# Patient Record
Sex: Male | Born: 1955 | Race: White | Hispanic: No | Marital: Married | Smoking: Former smoker
Health system: Southern US, Community
[De-identification: ages and names within clinical notes are randomized; demographics above are authoritative.]

## PROBLEM LIST (undated history)

## (undated) DIAGNOSIS — I4891 Unspecified atrial fibrillation: Secondary | ICD-10-CM

## (undated) DIAGNOSIS — I10 Essential (primary) hypertension: Secondary | ICD-10-CM

## (undated) DIAGNOSIS — Z95818 Presence of other cardiac implants and grafts: Principal | ICD-10-CM

## (undated) DIAGNOSIS — G4730 Sleep apnea, unspecified: Secondary | ICD-10-CM

## (undated) DIAGNOSIS — T4145XA Adverse effect of unspecified anesthetic, initial encounter: Secondary | ICD-10-CM

## (undated) DIAGNOSIS — T8859XA Other complications of anesthesia, initial encounter: Secondary | ICD-10-CM

## (undated) DIAGNOSIS — R51 Headache: Secondary | ICD-10-CM

## (undated) DIAGNOSIS — M1990 Unspecified osteoarthritis, unspecified site: Secondary | ICD-10-CM

## (undated) DIAGNOSIS — N189 Chronic kidney disease, unspecified: Secondary | ICD-10-CM

## (undated) DIAGNOSIS — C801 Malignant (primary) neoplasm, unspecified: Secondary | ICD-10-CM

## (undated) DIAGNOSIS — I499 Cardiac arrhythmia, unspecified: Secondary | ICD-10-CM

## (undated) HISTORY — PX: BACK SURGERY: SHX140

## (undated) HISTORY — PX: TONSILLECTOMY: SUR1361

## (undated) HISTORY — PX: APPENDECTOMY: SHX54

## (undated) HISTORY — PX: LAPAROSCOPIC GASTRIC BANDING: SHX1100

## (undated) HISTORY — PX: HERNIA REPAIR: SHX51

## (undated) HISTORY — PX: CARPAL TUNNEL RELEASE: SHX101

## (undated) HISTORY — PX: MANDIBLE FRACTURE SURGERY: SHX706

## (undated) HISTORY — PX: COLON SURGERY: SHX602

## (undated) HISTORY — PX: CARDIAC CATHETERIZATION: SHX172

---

## 1998-05-03 ENCOUNTER — Ambulatory Visit (HOSPITAL_COMMUNITY): Admission: RE | Admit: 1998-05-03 | Discharge: 1998-05-04 | Payer: Self-pay | Admitting: *Deleted

## 1998-07-04 ENCOUNTER — Ambulatory Visit (HOSPITAL_COMMUNITY): Admission: RE | Admit: 1998-07-04 | Discharge: 1998-07-04 | Payer: Self-pay | Admitting: Gastroenterology

## 1998-09-22 ENCOUNTER — Ambulatory Visit (HOSPITAL_COMMUNITY): Admission: RE | Admit: 1998-09-22 | Discharge: 1998-09-22 | Payer: Self-pay

## 1998-09-22 ENCOUNTER — Encounter: Payer: Self-pay | Admitting: *Deleted

## 1998-10-18 ENCOUNTER — Encounter: Payer: Self-pay | Admitting: *Deleted

## 1998-10-18 ENCOUNTER — Ambulatory Visit (HOSPITAL_COMMUNITY): Admission: RE | Admit: 1998-10-18 | Discharge: 1998-10-18 | Payer: Self-pay | Admitting: *Deleted

## 1999-01-19 ENCOUNTER — Encounter: Payer: Self-pay | Admitting: Orthopedic Surgery

## 1999-01-19 ENCOUNTER — Ambulatory Visit (HOSPITAL_COMMUNITY): Admission: RE | Admit: 1999-01-19 | Discharge: 1999-01-19 | Payer: Self-pay | Admitting: Orthopedic Surgery

## 1999-02-18 ENCOUNTER — Ambulatory Visit (HOSPITAL_COMMUNITY): Admission: RE | Admit: 1999-02-18 | Discharge: 1999-02-18 | Payer: Self-pay | Admitting: *Deleted

## 1999-02-18 ENCOUNTER — Encounter: Payer: Self-pay | Admitting: *Deleted

## 1999-04-03 ENCOUNTER — Ambulatory Visit (HOSPITAL_COMMUNITY): Admission: RE | Admit: 1999-04-03 | Discharge: 1999-04-03 | Payer: Self-pay | Admitting: *Deleted

## 1999-04-15 ENCOUNTER — Encounter: Payer: Self-pay | Admitting: Gastroenterology

## 1999-04-15 ENCOUNTER — Ambulatory Visit (HOSPITAL_COMMUNITY): Admission: RE | Admit: 1999-04-15 | Discharge: 1999-04-15 | Payer: Self-pay | Admitting: Gastroenterology

## 1999-05-06 ENCOUNTER — Ambulatory Visit (HOSPITAL_COMMUNITY): Admission: RE | Admit: 1999-05-06 | Discharge: 1999-05-06 | Payer: Self-pay | Admitting: *Deleted

## 1999-05-25 ENCOUNTER — Ambulatory Visit (HOSPITAL_COMMUNITY): Admission: RE | Admit: 1999-05-25 | Discharge: 1999-05-25 | Payer: Self-pay | Admitting: *Deleted

## 1999-05-25 ENCOUNTER — Encounter: Payer: Self-pay | Admitting: *Deleted

## 1999-05-30 ENCOUNTER — Ambulatory Visit (HOSPITAL_COMMUNITY): Admission: RE | Admit: 1999-05-30 | Discharge: 1999-05-30 | Payer: Self-pay | Admitting: *Deleted

## 1999-05-30 ENCOUNTER — Encounter: Payer: Self-pay | Admitting: *Deleted

## 2000-06-22 ENCOUNTER — Ambulatory Visit (HOSPITAL_COMMUNITY): Admission: RE | Admit: 2000-06-22 | Discharge: 2000-06-22 | Payer: Self-pay | Admitting: Orthopedic Surgery

## 2000-06-22 ENCOUNTER — Encounter: Payer: Self-pay | Admitting: Orthopedic Surgery

## 2000-09-07 ENCOUNTER — Ambulatory Visit (HOSPITAL_COMMUNITY): Admission: RE | Admit: 2000-09-07 | Discharge: 2000-09-07 | Payer: Self-pay | Admitting: Gastroenterology

## 2000-09-07 ENCOUNTER — Encounter: Payer: Self-pay | Admitting: Gastroenterology

## 2000-09-07 ENCOUNTER — Encounter (INDEPENDENT_AMBULATORY_CARE_PROVIDER_SITE_OTHER): Payer: Self-pay | Admitting: Specialist

## 2002-01-20 ENCOUNTER — Encounter: Payer: Self-pay | Admitting: Gastroenterology

## 2002-01-20 ENCOUNTER — Ambulatory Visit (HOSPITAL_COMMUNITY): Admission: RE | Admit: 2002-01-20 | Discharge: 2002-01-20 | Payer: Self-pay | Admitting: Gastroenterology

## 2003-10-12 ENCOUNTER — Inpatient Hospital Stay (HOSPITAL_COMMUNITY): Admission: EM | Admit: 2003-10-12 | Discharge: 2003-10-19 | Payer: Self-pay | Admitting: *Deleted

## 2003-10-12 ENCOUNTER — Encounter (INDEPENDENT_AMBULATORY_CARE_PROVIDER_SITE_OTHER): Payer: Self-pay | Admitting: Specialist

## 2003-11-11 ENCOUNTER — Inpatient Hospital Stay (HOSPITAL_COMMUNITY): Admission: EM | Admit: 2003-11-11 | Discharge: 2003-11-15 | Payer: Self-pay | Admitting: Emergency Medicine

## 2003-12-05 ENCOUNTER — Inpatient Hospital Stay (HOSPITAL_COMMUNITY): Admission: AD | Admit: 2003-12-05 | Discharge: 2003-12-11 | Payer: Self-pay | Admitting: General Surgery

## 2003-12-05 ENCOUNTER — Encounter: Admission: RE | Admit: 2003-12-05 | Discharge: 2003-12-05 | Payer: Self-pay | Admitting: Surgery

## 2003-12-15 ENCOUNTER — Ambulatory Visit (HOSPITAL_COMMUNITY): Admission: RE | Admit: 2003-12-15 | Discharge: 2003-12-15 | Payer: Self-pay | Admitting: Surgery

## 2004-01-27 ENCOUNTER — Inpatient Hospital Stay (HOSPITAL_COMMUNITY): Admission: EM | Admit: 2004-01-27 | Discharge: 2004-01-31 | Payer: Self-pay | Admitting: Emergency Medicine

## 2004-02-09 ENCOUNTER — Inpatient Hospital Stay (HOSPITAL_COMMUNITY): Admission: RE | Admit: 2004-02-09 | Discharge: 2004-02-16 | Payer: Self-pay | Admitting: Surgery

## 2004-02-09 ENCOUNTER — Encounter (INDEPENDENT_AMBULATORY_CARE_PROVIDER_SITE_OTHER): Payer: Self-pay | Admitting: *Deleted

## 2004-04-09 ENCOUNTER — Ambulatory Visit (HOSPITAL_BASED_OUTPATIENT_CLINIC_OR_DEPARTMENT_OTHER): Admission: RE | Admit: 2004-04-09 | Discharge: 2004-04-09 | Payer: Self-pay | Admitting: Family Medicine

## 2005-03-24 ENCOUNTER — Encounter: Admission: RE | Admit: 2005-03-24 | Discharge: 2005-03-24 | Payer: Self-pay | Admitting: Neurosurgery

## 2005-04-25 ENCOUNTER — Inpatient Hospital Stay (HOSPITAL_COMMUNITY): Admission: RE | Admit: 2005-04-25 | Discharge: 2005-04-29 | Payer: Self-pay | Admitting: Neurosurgery

## 2005-06-05 ENCOUNTER — Encounter: Admission: RE | Admit: 2005-06-05 | Discharge: 2005-06-05 | Payer: Self-pay | Admitting: Neurosurgery

## 2005-08-06 ENCOUNTER — Encounter: Admission: RE | Admit: 2005-08-06 | Discharge: 2005-08-06 | Payer: Self-pay | Admitting: Neurosurgery

## 2007-10-26 ENCOUNTER — Encounter: Admission: RE | Admit: 2007-10-26 | Discharge: 2007-10-26 | Payer: Self-pay | Admitting: Neurosurgery

## 2010-12-04 ENCOUNTER — Encounter
Admission: RE | Admit: 2010-12-04 | Discharge: 2010-12-04 | Payer: Self-pay | Source: Home / Self Care | Attending: Neurosurgery | Admitting: Neurosurgery

## 2011-04-18 NOTE — Op Note (Signed)
[facility]  Patient:    KAINAN, PATTY                   MRN: [id number] Proc. Date: 09/07/00 Attending:  Griffith Citron, M.D. CC:         Dyanne Carrel, M.D.   Operative Report  PROCEDURE:   Panendoscopy, biopsy, Savary dilation.  ENDOSCOPIST:  Griffith Citron, M.D.  INDICATIONS:  A 55 year old white male with recurrent symptoms of dysphagia. Last evaluated July 1999 for similar complaints.  Endoscopy revealing a distal esophageal stricture dilated to 20 mm Savary.  Recurrent symptoms over the past two to three months despite Prevacid 30 mg p.o. b.i.d.  After being seen in the office two weeks ago, switched to Nexium 40 mg daily. The patient notes marked improvement, decreased pyrosis, no nausea, vomiting, or regurgitation.  Dysphagia minimally improved.  DESCRIPTION OF PROCEDURE:  After reviewing the nature of the procedure with the patient including potential risks and complications including hemorrhage and perforation, informed consent was signed.  The patient was brought to the fluoroscopy suite where he was premedicated, receiving IV sedation totalling Versed  5 mg, fentanyl 87.5 mcg administered in divided doses prior to and during the course of the procedure.  Using an Olympus video endoscope, the proximal esophagus was intubated under direct vision.  The oropharynx was normal.  No lesion of the epiglottis, vocal cords, or pyriform sinus.  The proximal, mid, and distal segments of the esophagus were normal.  At the level of the mucosal Z-line at approximately 36 cm, the patient had a moderate hiatal hernia extending to 40 cm, not inflamed.  There appeared to be a nonobstructing Schatzki ring rather than a true stricture at this level.  The mucosa was not inflamed or nodular.  The hiatal hernia itself was not inflamed.  The gastric fundus and body were normal.  Antrum remarkable for erosive antritis.  Biopsies obtained  both for Helicobacter and routine H/E.  The pylorus was symmetric and easily traversed.  Duodenal bulb and second portion were normal.  Retroflexed view of the angularis, lesser curvature, and fundus were negative.  A guidewire was laid along the greater curve.  Savary guided dilators were then sequentially passed, 17, 18, 19, and finally 20 mm diameter.  No heme staining, no chest pain.  The patient tolerated the procedure without difficulty.  Upon withdrawing the 20 mm dilator, the guidewire was also removed.  The stomach was decompressed, scope withdrawn.  Patient return to recovery in stable condition.  ASSESSMENT: 1. Schatzki ring, probable source of dysphagia. 2. Hiatal hernia, moderate, not inflamed. 3. Erosive gastritis, moderate.  Helicobacter and routine pathology obtained. 4. Esophageal dilation successfully completed to 20 mm diameter.  RECOMMENDATIONS: 1. Continue Nexium 40 mg daily for 1 month, the consider ongoing maintenance    therapy. 2. Treat if Helicobacter positive. 3. Repeat esophageal dilation p.r.n. DD:  09/07/00 TD:  09/08/00 Job: [id number]

## 2011-04-18 NOTE — H&P (Signed)
NAME:  RENNY, REMER                         ACCOUNT NO.:  [id number]   MEDICAL RECORD NO.:  [id number]                   PATIENT TYPE:  INP   LOCATION:  5708                                 FACILITY:  [facility]   PHYSICIAN:  Sharlet Salina T. Hoxworth, M.D.          DATE OF BIRTH:  [birth year]   DATE OF ADMISSION:  01/27/2004  DATE OF DISCHARGE:                                HISTORY & PHYSICAL   CHIEF COMPLAINT:  Fever and abdominal pain.   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  Since that time, he has developed recurrent pelvic and  parastomal abscesses requiring percutaneous drainage on two occasions in  December of 2004 and January of 2005.  He had been doing well recently until  about five days ago when he developed some fever up to 101 degrees.  He was  seen by his primary care M.D. and started on Biaxin for possible sinusitis.  The patient, however, has continued to have fever during this week up as  high as 101.9 yesterday.  He and his wife noted some redness to the striae  on his lower abdomen yesterday.  He has had some low back pain which is a  chronic problem for him, but this was worse this week and the pain has  migrated around to the left mid abdomen and left lower quadrant of his  abdomen over the past 24 hours.  He had some constipation and took some  laxatives earlier this week which relieved some lower abdominal pressure and  his colostomy has been functioning okay.  He has a normal small amount of  mucous drainage from his rectal stump.  He denies any shortness of breath,  cough, sinus congestion.   PAST SURGICAL HISTORY:  1. As above.  2. Multiple back surgeries.  3. Carpal tunnel release.  4. Jaw surgery.   PAST MEDICAL HISTORY:  1. Adult onset diabetes mellitus diet controlled.  2. Hypertension.  3. 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  4. Depression.   MEDICATIONS:  1. Atenolol 50 mg a day.  2. Diovan 80 mg a day.  3. Prevacid 30 mg a day.  4. Biaxin for the last four days.  5. Zoloft 25 mg a day.  6. Percocet p.r.n.   ALLERGIES:  PENICILLIN and OXYCODONE.   SOCIAL HISTORY:  He is married, accompanied by his wife.  Does not smoke  cigarettes or drink alcohol.   FAMILY HISTORY:  Noncontributory.   REVIEW OF SYSTEMS:  GENERAL:  Positive for fever as above.  HEENT:  Denies  sinus pain, congestion. LUNGS:  Denies shortness of breath, cough, or  wheezing. HEART:  Denies chest pain, palpitations, or swelling. ABDOMEN:  GI  as above.  GENITOURINARY:  Denies urinary burning or frequency. EXTREMITIES:  Chronic back pain.   PHYSICAL EXAMINATION:  VITAL SIGNS:  Temperature in the emergency room is  98.3, pulse 76, respirations 22, blood pressure 140/94.  GENERAL:  Mildly obese white male in no acute distress.  SKIN:  Warm and dry without rash.  HEENT:  No palpable masses or thyromegaly.  Sclerae nonicteric.  Nares and  oropharynx clear.  LUNGS:  Clear to auscultation without increased work of breathing.  HEART:  Regular rate and rhythm without murmurs.  No JVD or edema.  ABDOMEN:  Mildly obese.  Colostomy in the left upper quadrant.  There is  some mild erythema to some of the striae in his lower abdomen.  There is  moderate tenderness and some fullness in the left mid abdomen and parastomal  area on the left where he has had previous abscess drains placed.  No  organomegaly.  No hernias.  There is a midline wound with open area of  granulation tissue measuring about 3 x 2 cm that appears clean.  EXTREMITIES:  No joint swelling or deformity.  NEUROLOGY:  Alert, oriented, normal.   LABORATORY DATA:  White count elevated at 17.3 thousand, hemoglobin 12.5.  Urinalysis negative.  LFT's normal.  Albumin 2.7.  Electrolytes abnormal for  a sodium of 131, potassium 2.8, glucose 131.   ASSESSMENT:  21. A 55 year old white male with history of  Hartman colectomy for     diverticulitis last year with history of recurrent pelvic and parastomal     abscesses requiring percutaneous drainage.  He now has fever, elevated     white count, abdominal pain and fullness, all suggestive of recurrent     abscess.  He will be admitted and placed on broad spectrum IV antibiotics     and will obtain a CT scan of the abdomen and pelvis.  2. Hyponatremia and hypokalemia to be corrected with IV fluids and     replacement.                                                Lorne Skeens. Hoxworth, M.D.    Tory Emerald  D:  01/27/2004  T:  01/27/2004  Job:  [id number]

## 2011-04-18 NOTE — H&P (Signed)
NAME:  Casey [name], Casey [name]                         ACCOUNT NO.:  [id number]   MEDICAL RECORD NO.:  [id number]                   PATIENT TYPE:  INP   LOCATION:  5031                                 FACILITY:  [facility]   PHYSICIAN:  Ollen Gross. Vernell Morgans, M.D.              DATE OF BIRTH:  Jul 17, 1956   DATE OF ADMISSION:  11/10/2003  DATE OF DISCHARGE:                                HISTORY & PHYSICAL   Casey [name] is a 55 year old white male who is approximately three weeks now  status post sigmoid colectomy and colostomy for a perforated sigmoid  diverticulitis done by Dr. Magnus Ivan.  He initially did well after surgery  but over the last couple of days has been having fevers and lower abdominal  pain.  He has had one episode of dry heaves but has otherwise not been  nauseated.  He has run some fevers at home.  The pain has really worsened  over the last couple of days since it started.  His ostomy has been  functional, and his open wound has been stable.  He otherwise denies any  chest pains, shortness of breath, diarrhea, or dysuria, and his other review  of systems is unremarkable.   PAST MEDICAL HISTORY:  1. Diverticulitis.  2. Back problems.  3. Diabetes.  4. Hypertension.   PAST SURGICAL HISTORY:  1. Multiple back surgeries.  2. Sigmoid colectomy and colostomy.  3. Carpal tunnel.  4. Jaw surgery.  5. Bone harvest from the left hip.   MEDICATIONS:  Atenolol, Diovan, Prevacid.   ALLERGIES:  1. PENICILLIN.  2. OXYCODONE.   SOCIAL HISTORY:  Denies the use of alcohol or tobacco products.   FAMILY HISTORY:  Noncontributory.   PHYSICAL EXAMINATION:  VITAL SIGNS:  His temp is 101.5, blood pressure  100/57, pulse 88.  GENERAL:  He is a well-developed, well-nourished white male in no acute  distress.  SKIN:  Warm and dry with no jaundice.  EYES:  Extraocular muscles are intact.  Pupils are equal, round, and  reactive to light.  Sclerae are anicteric.  LUNGS:  Clear bilaterally with  no use of accessory respiratory muscles.  HEART:  Regular rate and rhythm with an impulse in the left chest.  ABDOMEN:  Soft with some lower abdominal tenderness, but no guarding or  peritoneal signs.  His ostomy is pink and productive,  slightly retracted  but functional.  He has an open midline wound that is stable.  No palpable  mass or hepatosplenomegaly.  EXTREMITIES:  No clubbing, cyanosis, or edema.  PSYCHOLOGICAL:  He is alert and oriented x3 with no evidence of anxiety or  depression.   His white count is 20,000.  His urine is clean.   ASSESSMENT/PLAN:  A 55 year old gentleman status post sigmoid colectomy and  colostomy for perforated sigmoid diverticulitis in the recent past.  His  picture is certainly worrisome for a pelvic abscess related to  his previous  perforation.  We will admit him to the hospital for observation, pain  control, and broad-spectrum antibiotic therapy.  We will plan to get a CT  scan on him tonight that will look for any evidence of abscess that could  possibly be percutaneously drained by radiology.                                                Ollen Gross. Vernell Morgans, M.D.    PST/MEDQ  D:  11/11/2003  T:  11/11/2003  Job:  [id number]

## 2011-04-18 NOTE — Discharge Summary (Signed)
NAME:  Casey [name], Casey [name]                         ACCOUNT NO.:  [id number]   MEDICAL RECORD NO.:  [id number]                   PATIENT TYPE:  INP   LOCATION:  5708                                 FACILITY:  [facility]   PHYSICIAN:  Abigail Miyamoto, M.D.              DATE OF BIRTH:  1956/11/23   DATE OF ADMISSION:  01/27/2004  DATE OF DISCHARGE:  01/31/2004                                 DISCHARGE SUMMARY   DISCHARGE DIAGNOSES:  1. Pelvic abscess, status post colostomy for perforated diverticulitis.  2. Type 2 diabetes.  3. Hypokalemia.   SUMMARY OF HISTORY:  [name] is a 55 year old gentleman who is status  post Hartmann's colectomy and colostomy in November 2004 for perforated  diverticulitis.  He had had a pelvic and peristomal abscess which was  drained in late December/early January.  He presented after the drain has  been removed for several weeks with abdominal pain and fever.  Therefore,  decision was made to admit him to the hospital.   HOSPITAL COURSE:  The patient was admitted and IV antibiotics were  performed.  He underwent a CAT scan of the abdomen and pelvis, which showed  him to have recurrent left lower quadrant abscess.  At this point, he  underwent drainage again by the radiologist.  A PICC line was also placed  for home IV antibiotics and he was started on vancomycin.  He underwent  percutaneous drainage, which was successful, and cultures at this time were  obtained as well.  White blood count returned 8.6 and he defervesced and  generally felt well.  By January 31, 2004, he did well and his cultures were  growing microaerophilic strep.  His creatinine at this time was normal and a  decision was made to discharge him home with the drains in place, on IV  antibiotics.   DISCHARGE DIET:  Regular.   DISCHARGE ACTIVITY:  He is still to do no heavy lifting.   SPECIAL DISCHARGE INSTRUCTIONS:  Home health is arranged for his wet-to-dry  dressing changes of the midline  wound as well as his IV antibiotics.   DISCHARGE MEDICATIONS:  1. He is going to be on vancomycin 1750 IV q.12 h.  2. He will also be on clindamycin 150 mg p.o. 4 times daily.  3. He will resume his home medications.   FOLLOWUP:  I will see him next week in my office.                                                Abigail Miyamoto, M.D.    DB/MEDQ  D:  03/04/2004  T:  03/05/2004  Job:  [id number]

## 2011-04-18 NOTE — Op Note (Signed)
NAMEALHASSAN, EVERINGHAM               ACCOUNT NO.:  [id number]   MEDICAL RECORD NO.:  [id number]          PATIENT TYPE:  INP   LOCATION:  2899                         FACILITY:  [facility]   PHYSICIAN:  Kathaleen Maser. Pool, M.D.    DATE OF BIRTH:  18-Sep-1956   DATE OF PROCEDURE:  04/25/2005  DATE OF DISCHARGE:                                 OPERATIVE REPORT   PREOPERATIVE DIAGNOSES:  1.  L3-4 degenerative disk disease with stenosis.  2.  L4-5 previous interbody fusion with cage instrumentation with documented      pseudoarthrosis and pain.  3.  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   POSTOPERATIVE DIAGNOSES:  1.  L3-4 degenerative disk disease with stenosis.  2.  L4-5 previous interbody fusion with cage instrumentation with documented      pseudoarthrosis and pain.  3.  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   PROCEDURES:  1.  L3-4 decompressive lumbar laminectomy with foraminotomies.      Decompression exceeding what would be required for normal interbody      fusion.  2.  L3-4 posterior lumbar interbody fusion utilizing Tangent wedges and Tela      mon interbody cages with local autografting.  3.  Interbody bone morphogenic protein placement.  4.  L4-5 reexploration of laminectomy with bilateral redo L4 and L5      foraminotomies.  5.  L4-5 reexploration of fusion.  6.  L5-S1 Gill procedure with L5 and S1 foraminotomies.  7.  L5-S1 posterior lumbar interbody fusion utilizing Tangent wedges and      local autografting.  8.  L3 through S1 posterolateral arthrodesis utilizing intertransverse bone      grafting with local autograft and bone morphogenic protein and segmental      pedicle screw instrumentation.   SURGEON:  Kathaleen Maser. Pool, M.D.   ASSISTANT:  Tia Alert, M.D.   ANESTHESIA:  General endotracheal.   INDICATIONS:  Mr. Adames is a 55 year old male who is status post previous  L4-5 interbody fusion with  cages by another physician.  The patient presents  to me with intractable, severe back pain which is disabling and has been  described by the patient as being unlivable.  Workup at this time  demonstrates evidence of subsidence of the cages at L4-5 with loosening of  the cages readily apparent on CT scanning.  The patient also has evidence of  significant degenerative disk disease with stenosis at the L3-4 level  causing compression of the thecal sac and nerve roots.  The patient also had  evidence of an L5-S1 grade 1 lytic spondylolisthesis.  I have discussed the  options available for management, including the patient possibly undergoing  L3 through S1 decompression fusion and instrumentation.  The patient is  aware of the risks and benefits and wishes to proceed.   OPERATIVE NOTE:  The patient was taken to the operating room and placed on  the operating table in supine position.  After an adequate level of  anesthesia was achieved, the patient positioned prone onto a [employer]  frame  and appropriately padded.  The patient's lumbar region was prepped and  draped sterilely.  A 10 blade was used to make a linear skin incision  extending from L2 down to the midsacrum.  This was carried down sharply in  the midline.  A subperiosteal dissection was then performed exposing the  laminae and facet joints of L2, L3, L4, L5 and S1.  The transverse processes  of L3, L4, L5 and the sacral ala were then dissected free bilaterally.  A  deep self-retaining retractor was placed, intraoperative fluoroscopy was  used, and the levels were confirmed.  Starting first at L3-4, the spinous  process and the lamina of L3 was completely removed using Leksell rongeurs,  Kerrison rongeurs, a high-speed drill.  Complete inferior facetectomies at  L3 were then performed bilaterally.  Superior facetectomies at L4 were  performed bilaterally.  Wide foraminotomies were then performed along the  course of the exiting L3 and  L4 nerve roots for full decompression of these  nerve roots, once again exceeding what would be required for interbody  fusion alone.  Decompression then proceeded along the L4 nerve root  distally.  Epidural venous plexus coagulated and cut.  The dense overlying  scar at the L5 level was dissected free and resected.  The underlying thecal  sac was identified.  Further decompression was then performed along the  course of the existing L4 nerve roots.  The L5 nerve roots were then  identified proximally and decompressed.  The free-floating lamina at L5 was  then identified.  This was then removed using Leksell rongeurs, Kerrison  rongeurs and the high-speed drill.  The inferior facets of L5 were also  removed.  Decompression of the L5 nerve roots was then performed by  undercutting the rudimentary vestigial L5 inferior facet in a method  described as the Gill procedure.  Wide foraminotomies were then performed  along the course of the exiting L5 and S1 nerve roots.  Epidural venous  plexus was coagulated and cut at the L5-S1 level.  Returning to L3-4, thecal  sac and nerve roots were protected and retracted toward the midline.  The  disk space on the left side was then incised with a 15 blade in rectangular  fashion.  A wide disk space clean-out was then achieved using pituitary  rongeurs, upward-angled pituitary rongeurs and Epstein curettes.  All  elements of the disk were completely resected from this side.  The  diskectomy was then repeated on the contralateral side and then the  diskectomy was repeated bilaterally at L5-S1.  Returning to L3-4, thecal sac  and nerve roots protected on the left side.  A distractor was placed in the  left side and the disk space was sequentially dilated up to 10 mm.  The  thecal sac and nerve roots were then protected on the right side.  The disk  space was then reamed with a 10 mm Tangent box cutter and then cut with a 10 mm Tangent chisel.  Soft tissue  was removed from the interspace.  A 10 x 26  mm Telemon wedge packed with interbody bone was then impacted into place.  This was recessed approximately 2 mm from the posterior cortical margin.  The distractor was removed from the patient's left side.  The thecal sac and  nerve roots protected on the left side.  Once again the disk space was  reamed and then cut with 10 mm Tangent instruments.  All loose disk material  was removed from the interspace.  The disk space was further curettaged.  Morcellized autograft in a collagen-soaked sponge with BMP was then packed  into the interspace.  A 10 x 26 mm Tangent wedge was then impacted in place  and recessed approximately 2 mm from the posterior cortical margin.  The  procedure was then repeated bilaterally at L5-S1 again without complication.  The fusion at L4-5 was explored and found to be not solid.  Pedicles at L3,  L4, L5 and S1 were then identified using surface landmarks.  The very  superficial bone overlying the pedicle was then removed using the high-speed  drill.  Each pedicle was then probed using the pedicle awl.  Each pedicle  awl track was then probed with a blunt probe and found to be solidly within  bone.  Each awl track was then tapped with a 5.25 mm screw tap.  Spiral 90D  6.75 x 45 mm screws were placed bilaterally at L3 and L4.  Screws 5.75 x 40  mm placed bilaterally at L5 secondary to small pedicles.  Screws 6.75 x 35  mm were placed bilaterally at S1.  The transverse processes at L3, L4, L5  and S1 were then decorticated using the high-speed drill.  Morcellized  autograft mixed with collagen-soaked BMP sponges was then packed  posterolateral for later fusion.  Titanium rods were then contoured and  placed over the screw heads from L3 to L5.  The locking caps were then  engaged over the screw heads.  The locking caps were then given a final  tightening with the construct under compression.  Transverse connectors were   placed.  Gelfoam was placed for hemostasis.  A medium Hemovac drain was left  in the epidural space.  Final images revealed good position of bone grafts  and  hardware, proper operative level, with normal alignment of the spine.  The  wound was then closed in layers with Vicryl sutures.  Steri-Strips and  sterile dressing were applied.  There were no apparent complications.  The  patient tolerated the procedure well, and he returns to the recovery room  postop.      HAP/MEDQ  D:  04/25/2005  T:  04/25/2005  Job:  [id number]

## 2011-04-18 NOTE — Discharge Summary (Signed)
NAMEABHI, MOCCIA               ACCOUNT NO.:  [id number]   MEDICAL RECORD NO.:  [id number]          PATIENT TYPE:  INP   LOCATION:  3010                         FACILITY:  [facility]   PHYSICIAN:  Kathaleen Maser. Pool, M.D.    DATE OF BIRTH:  July 29, 1956   DATE OF ADMISSION:  04/25/2005  DATE OF DISCHARGE:  04/29/2005                                 DISCHARGE SUMMARY   FINAL DIAGNOSIS:  L4-5 pseudoarthrosis, L3-4, L5-S1 degenerative disk  disease with an L5-S1 lytic spondylolisthesis.   OPERATIONS/TREATMENT:  L3 through S1 decompression and fusion  instrumentation.   HISTORY OF PRESENT ILLNESS:  Mr. Coate is a 55 year old who is status post  previous L4-5 fusion done by another physician. The patient has evidence of  pseudoarthrosis. He also has systematic degenerative disk disease of the L3-  4 level and a lytic grade 1 spondylolisthesis at L5-S1. The patient presents  now for L3-S1 decompression and fusion instrumentation.   OPERATIVE NOTE:  The patient underwent an uncomplicated L3-S1 decompression  and fusion instrumentation was performed. Postoperative, the patient did  reasonably well. Lower extremity pain was much improved. The patient  obviously had a significant amount of back pain. This was managed medically.  He was gradually mobilized. At the time of discharge, the patient is  ambulating without difficulty. He is still much improved with regard to his  lower extremity pain. Bowel and bladder function were normal. Wound is  healing well. He will follow up in my office in 1 week.   CONDITION ON DISCHARGE:  Improved.           ______________________________  Kathaleen Maser Pool, M.D.     HAP/MEDQ  D:  07/22/2005  T:  07/22/2005  Job:  [id number]

## 2011-04-18 NOTE — Discharge Summary (Signed)
NAME:  Casey [name], Casey [name]                         ACCOUNT NO.:  [id number]   MEDICAL RECORD NO.:  [id number]                   PATIENT TYPE:  INP   LOCATION:  5708                                 FACILITY:  [facility]   PHYSICIAN:  Abigail Miyamoto, M.D.              DATE OF BIRTH:  29-Dec-1955   DATE OF ADMISSION:  02/09/2004  DATE OF DISCHARGE:  02/16/2004                                 DISCHARGE SUMMARY   DISCHARGE DIAGNOSES:  1. Status post colostomy take down and colon reanastomosis.  2. Hypertension.  3. Type 2 diabetes.  4. Obesity.   SUMMARY OF HISTORY:  Mr. Casey [name] is a pleasant 55 year old gentleman  who had had a colostomy performed emergently for perforated diverticulitis.  He now presents for a colostomy take down and colon reanastomosis as well as  incidental appendectomy.   HOSPITAL COURSE:  The patient was admitted on February 09, 2004 and taken to  the operating room where he underwent a colostomy take down and colon  reanastomosis as well as an appendectomy. He had one 19-French Blake drain  left in place in the pelvis where he had had previous abscesses. He  tolerated the procedure well and was taken in stable condition to the  regular surgical floor. Over the next several days, he had routine  postoperative care with bowel rest and IV rehydration. On postoperative day  #3, his IV fluids were decreased. The Foley was removed, and he was started  on a clear liquid diet. At this point, he did have some mild erythema of the  incision, and the antibiotics were changed to Ancef. Postoperative day #4,  his laboratory data was checked. His white blood count was found to be  normal at 8.1, hemoglobin was 11.0, creatinine 0.7, potassium 3.4. He was  continued on a liquid diet at this time. By postoperative day #6, he was  having multiple bowel movements. His abdomen was soft. His erythema around  his incision had resolved. His drain remained serosanguineous. At this  point,  his diet was advanced. By postoperative day #7, he was tolerating a  regular diet. He was having multiple bowel movements. His drain was still  draining serosanguineous fluid, but otherwise, he was afebrile, and decision  was made to discharge the patient with the drain in place for removal later  in the office.   DISCHARGE DIET:  Regular.   DISCHARGE ACTIVITIES:  He should do not heavy lifting greater than 20 pounds  for six weeks.   WOUND CARE:  He may shower. He will record his drain output.   MEDICATIONS:  He will resume his home medications. He will take Percocet and  Advil for pain. He will take Keflex 500 mg t.i.d. for three days.   DISCHARGE FOLLOWUP:  He will follow up in my office this following Monday  for drain and staple removal.  Abigail Miyamoto, M.D.   DB/MEDQ  D:  04/02/2004  T:  04/03/2004  Job:  [id number]

## 2011-04-18 NOTE — Discharge Summary (Signed)
NAME:  Casey [name], Casey [name]                         ACCOUNT NO.:  [id number]   MEDICAL RECORD NO.:  [id number]                   PATIENT TYPE:  INP   LOCATION:  5714                                 FACILITY:  [facility]   PHYSICIAN:  Abigail Miyamoto, M.D.              DATE OF BIRTH:  1956-03-15   DATE OF ADMISSION:  12/05/2003  DATE OF DISCHARGE:  12/11/2003                                 DISCHARGE SUMMARY   SUMMARY OF HISTORY:  Casey [name] is a 55 year old gentleman who has  undergone emergent exploration with partial colectomy and colostomy for  perforated diverticulitis.  He presented postoperatively in December with an  abscess and had his percutaneous drain.  However, he presented back to the  office with increasing fevers and chills.  At this point, a CT scan was  performed which showed two recurrent abscesses.   HOSPITAL COURSE:  The patient was admitted and placed on IV antibiotics.  Radiology undertook percutaneous drainage of these abscesses.  Two more  drains were placed.  After these were placed, he quickly defervesced. He was  placed on Cipro and Flagyl.  Over the next few days, he continued to feel  stronger.  His blood glucose at this time remained 103 to 116 range.  He  appeared well controlled.  His white blood count continued to decrease, and  he was down to 12,000 on January 8.  His drains still drained purulent  fluids over the next several days, however.  By January 9, his cultures grew  out ________ strep and he was placed on vancomycin for 24 hours.  He  continued to do well.  The drainage continued to decrease.  By January 10,  he was afebrile.  His ostomy was working well.  His abdomen was soft.  There  was minimal induration of his wounds and white blood count was normal.  At  this point, the decision was made to discharge the patient home with Home  Health and drain care.   DISCHARGE DIAGNOSIS:  Recurrent pelvic abscess, status post perforated  diverticulitis.   DISCHARGE DIET:  Regular.   DISCHARGE ACTIVITY:  As tolerated.  Home Health is arranged for wound care,  ostomy care and drain care.   DISCHARGE MEDICATIONS:  1. Home medications.  2. Cipro.  3. Flagyl for 14 days.   FOLLOWUP:  He will follow up in my office next week post discharge.                                                Abigail Miyamoto, M.D.    DB/MEDQ  D:  01/01/2004  T:  01/01/2004  Job:  [id number]

## 2011-04-18 NOTE — Discharge Summary (Signed)
NAME:  SEDALE, JENIFER                         ACCOUNT NO.:  [id number]   MEDICAL RECORD NO.:  [id number]                   PATIENT TYPE:  INP   LOCATION:  5031                                 FACILITY:  [facility]   PHYSICIAN:  Abigail Miyamoto, M.D.              DATE OF BIRTH:  [birth year]   DATE OF ADMISSION:  11/11/2003  DATE OF DISCHARGE:  11/15/2003                                 DISCHARGE SUMMARY   DISCHARGE DIAGNOSES:  1. Pelvic abscess, status post perforated diverticulitis.  2. Type 2 diabetes.  3. Hypertension.   SUMMARY OF HISTORY:  Mr. Frazzini presented about 3 weeks status post  colostomy for perforated diverticulitis and was found to have a fever, lower  abdominal pain and white count of 20,000.  CAT scan was performed that  showed him to have a pelvic abscess.   HOSPITAL COURSE:  The patient was admitted percutaneous drainage of the  pelvic abscess by interventional radiology; he tolerated this well and was  taken in stable condition to a regular surgical floor.  He quickly  defervesced and his white blood count decreased to 13,000.  He was placed on  Cipro and Flagyl for this.  He continued to do well and by November 13, 2003, was afebrile.  His diabetes was well-controlled on a sliding scale.  Over the next several days, his drainage decreased significantly.  By  November 15, 2003, it was draining minimal fluid compared to what was being  placed in it for irrigation purposes.  At this point, decision was made with  him doing well to discharge him home with home health for drain care with  followup as an outpatient for drain removal.   DISCHARGE DIET:  Regular.   DISCHARGE ACTIVITY:  He is to do no heavy lifting.   WOUND CARE:  Home health is arranged for wet-to-dry dressing changes b.i.d.  as well as drain care.   DISCHARGE MEDICATIONS:  He would resume his home medications including his  diabetic medications and hypertensive medications.  He will take Cipro  and  Flagyl as prescribed.   FOLLOWUP:  He will follow up in my office this Friday after discharge.                                                Abigail Miyamoto, M.D.    DB/MEDQ  D:  01/01/2004  T:  01/01/2004  Job:  [id number]

## 2011-04-18 NOTE — Op Note (Signed)
NAME:  Casey [name]                         ACCOUNT NO.:  [id number]   MEDICAL RECORD NO.:  [id number]                   PATIENT TYPE:  INP   LOCATION:  5708                                 FACILITY:  [facility]   PHYSICIAN:  Abigail Miyamoto, M.D.              DATE OF BIRTH:  [birth year]   DATE OF PROCEDURE:  02/11/2004  DATE OF DISCHARGE:                                 OPERATIVE REPORT   PREOPERATIVE DIAGNOSIS:  Colostomy.   POSTOPERATIVE DIAGNOSIS:  Colostomy.   PROCEDURE:  1. Colostomy take-down and colon anastomosis.  2. Incidental appendectomy.   SURGEON:  Abigail Miyamoto, M.D.   ASSISTANT:  Gabrielle Dare. Janee Morn, M.D.   ANESTHESIA:  General anesthesia.   ESTIMATED BLOOD LOSS:  Minimal.   INDICATIONS FOR PROCEDURE:  Casey [name] is a 55 year old gentleman who  underwent exploratory laparotomy for perforated diverticulitis in 11/04.  He  underwent colostomy and Hartmann's procedure.  Postoperatively, he had  abscesses in the pelvis at 3 different times necessitating percutaneous  drainage. He also had retraction of his colostomy.   He has since healed.  He had 1 drain left in place in his pelvis.  He is now  presenting for colostomy takedown.   FINDINGS:  The patient was found to have a rectal stump which may have  leaked, causing the ongoing abscesses.  Small bowel and colon was stuck in  the left lower quadrant and was able to be freed up.  The colostomy and  descending colon were normal in appearance.   DESCRIPTION OF PROCEDURE:  The patient was brought to the operating room,  identified as Casey [name]. He was placed supine on the operating table and  general anesthesia was induced.  His ostomy site was then closed with a 2-0  silk suture.  Next, the percutaneous drain in his right lower quadrant was  removed. His abdomen was then prepped and draped in the usual sterile  fashion.   Using a #10 blade, a midline incision was then created by first removing the  patient's old scar.  Incision was carried down to the fascia which was then  opened, removing the entire scar.  Upon entering the abdomen, the patient  was found to have some omentum stuck to the abdominal wall cavity. This was  taken down with the electrocautery.  The ostomy was then examined and found  to be intact and the descending colon appeared normal.  The patient was  found to have several loops of small bowel and the rectum stuck in the left  lower quadrant where the previous abscesses had been.  Extensive lysis of  adhesions had to be carried out here in order to free up the loops of small  bowel.  One enterotomy was created and closed with interrupted 3-0 silk  sutures.   After examining the rectal stump, it was apparent that the stump with the  staple line at the rectal  stump may have leaked, causing the abscess.  The  several loops of small bowel were finally freed off of this and the rectal  stump was mobilized further.  The rest of the small bowel was examined from  the ligament of Treitz to the terminal ileum and was found to be intact.  No  other injuries were identified to the areas of small bowel that were freed  up from the area of concern in the right lower quadrant.  During this time,  the gallbladder was examined and found to be normal without evidence of  stones.  The appendix also appeared normal.   Next, the skin around the previous ostomy site was excised in an elliptical  fashion with the #15 blade.  Incision was carried down circumferentially  around the ostomy to the fascia with the electrocautery. The ostomy was then  retracted back into the peritoneal cavity.  Then the rest of the attachments  were excised, freeing up the entire descending colon.  The distal end of the  ostomy was then transected with the GIA-75 stapler, sent to pathology for  identification.  The proximal colon was then mobilized further along the  white line of Toldt. The colon easily fit  down into the pelvis and with the  long rectal stump, there appeared to be no tension.  At this point, a  colotomy was created, both in the rectal stump and in the descending colon.  The bowel was reapproximated in a side-to-side fashion from colon to rectum  with a single firing of the GIA-75 stapler.  Another firing of the GIA-75  stapler was used to excise the distal end of the rectal stump.   The opened end of the colon was then closed with interrupted 3-0 silk  sutures.  Tisseel was then brought into the field and used to cover the  anastomosis, both the suture and staple lines. A silk stitch was placed  distally on the staple line in order to take tension off the staple line  there as well.  Redundant epiploic fat was then sewn over the anastomosis as  well.  The abdomen prior to this was irrigated with several L of normal  saline.   The appendix was then identified. The mesoappendix was taken down with Kelly  clamps and 2-0 silk ties.  The base of the appendix was identified and  closed with 2-0 silk ties as well. The mucosa at the appendiceal stump was  cauterized.  Next, attention was then turned toward the ostomy. The  posterior fascia was closed from the inside with figure-of-eight #1 Novofil  pop-off sutures.  The anterior fascia was likewise closed with interrupted  #1 Novofil pop-off sutures as well.  The abdomen was again examined and  hemostasis was felt to be achieved.  The anastomosis was found to be quite  intact.   At this point, the midline fascia was closed with both running #1 PDS and  interrupted #1 Novofil pop-off retention sutures. Skin was then irrigated  and closed with skin staples.  The patient tolerated the procedure well.  All counts were correct at the end of the procedure.  The patient was then  extubated in the operating room and taken to the recovery room in stable  condition.  Abigail Miyamoto,  M.D.    DB/MEDQ  D:  02/09/2004  T:  02/11/2004  Job:  [id number]

## 2011-04-18 NOTE — Op Note (Signed)
NAME:  Casey [name], Casey [name]                         ACCOUNT NO.:  [id number]   MEDICAL RECORD NO.:  [id number]                   PATIENT TYPE:  INP   LOCATION:  1856                                 FACILITY:  [facility]   PHYSICIAN:  Abigail Miyamoto, M.D.              DATE OF BIRTH:  September 25, 1956   DATE OF PROCEDURE:  10/12/2003  DATE OF DISCHARGE:                                 OPERATIVE REPORT   PREOPERATIVE DIAGNOSIS:  Perforated sigmoid diverticulitis.   POSTOPERATIVE DIAGNOSIS:  Perforated sigmoid diverticulitis.   PROCEDURE:  1. Exploratory laparotomy.  2. Sigmoid colectomy with colostomy/Hartman's procedure.   SURGEON:  Douglas A. Magnus Ivan, M.D.   ASSISTANT:  Sheppard Plumber. Earlene Plater, M.D.   ANESTHESIA:  General endotracheal anesthesia.   ESTIMATED BLOOD LOSS:  Minimal.   INDICATIONS FOR PROCEDURE:  Casey [name] is a 55 year old gentleman who  presented to the emergency room with diffuse abdominal pain.  He was found  to have an elevated white blood count to 22,000.  CAT scan of the abdomen  was performed which showed him to have findings consistent with perforated  diverticulitis.   FINDINGS:  The patient was found to have a perforated sigmoid diverticulitis  with a large amount of purulence to the abdominal cavity.   PROCEDURE IN DETAIL:  The patient was brought to the operating room and  identified as Casey [name].  He was placed supine on the operating table  and general anesthesia was induced.  His abdomen was then prepped and draped  in the usual sterile fashion.  Using a #10 blade, a midline incision was  then created.  The incision was carried down through the subcutaneous tissue  and fascia with electrocautery.  The peritoneum was then opened the entire  length of the incision.  Upon entering the abdomen, the patient was found to  have a large amount of gross purulence.  Cultures were obtained of the  fluid.  Further examination revealed a lot of fibrinous exudate and  perforation of the sigmoid colon with a large amount of diverticulitis and a  large segment of colon.  The rectum was then identified.  It was transected  with a GIA-75 stapler just proximal to this.  The mesentery was then taken  down with Kelly clamps and 2-0 silk ties.  The colon was mobilized further  along the white line of Toldt, although this was difficult secondary to a  large inflammatory reaction along the left colic gutter.  The mesentery was  taken down further with Kelly clamps and 2-0 silk ties proximally.  An area  of proximal descending colon which filled free of the acute disease was  identified and transected, likewise, with the GIA-75 stapler.  The mesentery  between the remaining segment was then taken down and the colon specimen was  removed from the field and sent to pathology for identification.   At this point, the proximal colon was  mobilized a little further taking down  some more mesentery and mobilizing further along the white line of Toldt.  A  separate lateral skin incision was then made on the left side of the  abdomen.  This incision was taken down removing a large piece of  subcutaneous fat going down to the fascia.  The fascia was then opened in a  cruciate fashion.  The muscle fibers beneath this were then bluntly  dissected and the peritoneum was then opened.  The proximal colon was then  brought out this end as a colostomy.  The abdomen was then copiously  irrigated with multiple liters of normal saline.  The midline fascia was  closed with a running #1 Prolene suture and interrupted #1 Novafil pop-off  sutures as internal retention sutures.  The skin was left open and the  subcutaneous tissue was packed with wet to dry saline gauze.  The end of  colon coming out the colostomy site was then opened along the staple line  with electrocautery.  The colostomy was then matured circumferentially with  interrupted 3-0 Vicryl sutures.  The colostomy did appear  to be well  perfused.   All sponge, needle, and instrument counts were correct at the end of the  procedure.  An ostomy appliance was then placed over the ostomy and dry  gauze placed over the rest of the abdominal wound.  The patient was then  taken in guarded condition from the operating room to the recovery room.                                               Abigail Miyamoto, M.D.    DB/MEDQ  D:  10/12/2003  T:  10/13/2003  Job:  [id number]

## 2011-04-18 NOTE — Discharge Summary (Signed)
NAME:  Casey [name], Casey [name]                         ACCOUNT NO.:  [id number]   MEDICAL RECORD NO.:  [id number]                   PATIENT TYPE:  INP   LOCATION:  5736                                 FACILITY:  [facility]   PHYSICIAN:  Abigail Miyamoto, M.D.              DATE OF BIRTH:  05/06/1956   DATE OF ADMISSION:  10/12/2003  DATE OF DISCHARGE:  10/19/2003                                 DISCHARGE SUMMARY   DISCHARGE DIAGNOSES:  1. Perforated sigmoid diverticulitis.  2. Type 2 diabetes and hypertension.   HISTORY:  Casey [name] is a 55 year old gentleman who was admitted with a two-  day history of a lower abdominal pain which is now worse.  He had a CT scan  of the abdomen and pelvis which showed free air and diverticulitis.  He was  also found to have a white blood cell count of 22.3.  the patient was  admitted for emergent surgery.   HOSPITAL COURSE:  The patient was admitted, taken to the operating room  where he underwent emergent exploratory laparotomy with a partial colectomy,  Hartmann's procedure and colostomy.  He tolerated the procedure well and was  taken to the intensive care unit on IV antibiotics.  On postoperative day  #1, his white blood count had decreased to 18.6.  His labs otherwise were  unremarkable.  At that point he was stable and transferred to a regular  surgical floor.  Over the next several days he was followed closely.  He did  have hyperkalemia on postoperative day #3 and his potassium was held.  However, he continued to do well.  His ostomy was originally dusky then  pinked up.  By postoperative day #5 he was having gas in his bag.  His white  blood count had decreased to 15,000.  He did have mild hypokalemia so  potassium was given.  Throughout his diabetes was controlled with sliding  scale insulin and his ABGs were checked regularly.  The ostomy nurse began  seeing the patient and ostomy care was initiated.  As he continued to  improve, arrangements were  made for home health care.  On November 18, he  still was having some mild increase in his white blood count so CT scan of  the abdomen and pelvis was performed, but this showed no evidence of intra-  abdominal abscess.  Given this and the fact this his ostomy was working  well, he was discharged on a regular diet.  Decision was made to discharge  the patient to home.   DISCHARGE MEDICATIONS:  1. He will resume his home medications.  2. Cipro 500 mg p.o. b.i.d.  3. Flagyl 500 mg p.o. q.i.d.  4. Percocet and Advil for pain.   ACTIVITY:  No heavy lifting.   WOUND CARE:  Home health has been arranged for wet-to-dry dressing changes  of his open midline wound and for ostomy care.  FOLLOW UP:  He will follow up in my office in two weeks post discharge.                                               Abigail Miyamoto, M.D.   DB/MEDQ  D:  11/30/2003  T:  11/30/2003  Job:  [id number]

## 2011-04-18 NOTE — H&P (Signed)
NAME:  Casey [name], Casey [name]                         ACCOUNT NO.:  [id number]   MEDICAL RECORD NO.:  [id number]                   PATIENT TYPE:  INP   LOCATION:  1856                                 FACILITY:  [facility]   PHYSICIAN:  Abigail Miyamoto, M.D.              DATE OF BIRTH:  1956-08-07   DATE OF ADMISSION:  10/12/2003  DATE OF DISCHARGE:                                HISTORY & PHYSICAL   CHIEF COMPLAINT:  Abdominal pain.   HISTORY:  This is a 55 year old gentleman who started having abdominal pain  approximately two to three days ago.  He thought he was constipated and has  taken a lot of laxatives.  This is including magnesium citrate, trying to  get him to move his bowels.  He did have some relief but his belly pain  became acutely worse and after midnight last night became quite unbearable.  He has had no nausea or vomiting.  He has been moving his bowels well.  Since being in the hospital he has become short of breath.  He has had no  chest pain.  He also reports having fevers.  The rest of the review of  systems is negative.   PAST MEDICAL HISTORY:  1. Significant for a history of diverticulitis in the past which was     relieved medically.  2. He also has history of hiatal hernia.  3. Hypertension.  4. Adult onset diabetes.   PAST SURGICAL HISTORY:  1. Multiple surgeries on his lower back.  2. Carpal tunnel surgery.  3. Rotator cuff surgery.   MEDICATIONS:  Atenolol and Diovan.   ALLERGIES:  He is allergic to PENICILLIN which caused a rash as a child.   SOCIAL HISTORY:  He does not smoke.  He does not drink alcohol.  He lives in  [place] with his wife and children.   REVIEW OF SYSTEMS:  Again the review of systems is otherwise negative.   PHYSICAL EXAMINATION:  GENERAL:  Obese gentleman in moderate distress.  He  is currently mildly tachypneic.  VITAL SIGNS:  Temperature 101.8, respiratory rate 24, blood pressure 139/73,  heart rate 127.  HEENT:  He is  anicteric. Pupils are reactive bilaterally.  External ears and  nose are normal.  Oropharynx is dry.  Hearing is normal.  NECK:  Supple.  There is no cervical adenopathy.  There is no thyromegaly.  LUNGS:  Mildly increased effort with clear to auscultation bilaterally.  CARDIOVASCULAR:  Tachycardic with regular rhythm.  There are no murmurs.  There is no peripheral edema.  ABDOMEN:  Obese.  Diffusely tender, worse in the left lower quadrant with a  significant amount of guarding and acute peritonitis. There are no hernias.  EXTREMITIES:  Warm and well perfused.   LABORATORY DATA:  The patient has an elevated white blood count of 30.3,  hemoglobin 15.9, hematocrit 48.2, platelets 371.  BUN 22 and creatinine 1.1.  Sodium 138, potassium 3.6, glucose 169.  Liver function tests are normal.  The patient has a CT scan of the abdomen and pelvis which shows free air and  changes of diverticulitis with perforation.   ASSESSMENT AND PLAN:  The patient is a 55 year old gentleman with probable  perforated diverticulitis.  At this point the plan will be to proceed  emergently to the operating room.  Family understands the risks of surgery  including bleeding and infection.  They also understand the need for partial  colon resection and colostomy.  They also understand that the abdominal  incision may need to be left open.   At this point we will proceed emergently to the emergency room.                                                Abigail Miyamoto, M.D.    DB/MEDQ  D:  10/12/2003  T:  10/12/2003  Job:  [id number]

## 2011-12-11 DIAGNOSIS — E782 Mixed hyperlipidemia: Secondary | ICD-10-CM | POA: Diagnosis not present

## 2011-12-11 DIAGNOSIS — J301 Allergic rhinitis due to pollen: Secondary | ICD-10-CM | POA: Diagnosis not present

## 2011-12-11 DIAGNOSIS — K219 Gastro-esophageal reflux disease without esophagitis: Secondary | ICD-10-CM | POA: Diagnosis not present

## 2011-12-11 DIAGNOSIS — K279 Peptic ulcer, site unspecified, unspecified as acute or chronic, without hemorrhage or perforation: Secondary | ICD-10-CM | POA: Diagnosis not present

## 2011-12-11 DIAGNOSIS — IMO0001 Reserved for inherently not codable concepts without codable children: Secondary | ICD-10-CM | POA: Diagnosis not present

## 2011-12-11 DIAGNOSIS — I10 Essential (primary) hypertension: Secondary | ICD-10-CM | POA: Diagnosis not present

## 2011-12-15 DIAGNOSIS — R109 Unspecified abdominal pain: Secondary | ICD-10-CM | POA: Diagnosis not present

## 2012-01-06 ENCOUNTER — Other Ambulatory Visit: Payer: Self-pay | Admitting: Neurosurgery

## 2012-01-06 ENCOUNTER — Ambulatory Visit
Admission: RE | Admit: 2012-01-06 | Discharge: 2012-01-06 | Disposition: A | Discharge status: Home / Self Care | Payer: 59 | Source: Ambulatory Visit | Attending: Neurosurgery | Admitting: Neurosurgery

## 2012-01-06 DIAGNOSIS — M545 Low back pain, unspecified: Secondary | ICD-10-CM

## 2012-01-06 DIAGNOSIS — Z981 Arthrodesis status: Secondary | ICD-10-CM | POA: Diagnosis not present

## 2012-01-09 ENCOUNTER — Other Ambulatory Visit: Payer: Self-pay | Admitting: Neurosurgery

## 2012-01-09 DIAGNOSIS — M5136 Other intervertebral disc degeneration, lumbar region: Secondary | ICD-10-CM

## 2012-01-13 DIAGNOSIS — H353190 Nonexudative age-related macular degeneration, unspecified eye, stage unspecified: Secondary | ICD-10-CM | POA: Diagnosis not present

## 2012-01-13 DIAGNOSIS — E119 Type 2 diabetes mellitus without complications: Secondary | ICD-10-CM | POA: Diagnosis not present

## 2012-01-19 ENCOUNTER — Other Ambulatory Visit: Payer: Medicare Other

## 2012-01-20 ENCOUNTER — Ambulatory Visit
Admission: RE | Admit: 2012-01-20 | Discharge: 2012-01-20 | Disposition: A | Discharge status: Home / Self Care | Payer: Medicare Other | Source: Ambulatory Visit | Attending: Neurosurgery | Admitting: Neurosurgery

## 2012-01-20 VITALS — BP 97/50 | HR 55 | Ht 69.0 in | Wt 243.0 lb

## 2012-01-20 DIAGNOSIS — M4310 Spondylolisthesis, site unspecified: Secondary | ICD-10-CM | POA: Diagnosis not present

## 2012-01-20 DIAGNOSIS — M5136 Other intervertebral disc degeneration, lumbar region: Secondary | ICD-10-CM

## 2012-01-20 DIAGNOSIS — M5126 Other intervertebral disc displacement, lumbar region: Secondary | ICD-10-CM | POA: Diagnosis not present

## 2012-01-20 DIAGNOSIS — M47817 Spondylosis without myelopathy or radiculopathy, lumbosacral region: Secondary | ICD-10-CM | POA: Diagnosis not present

## 2012-01-20 MED ORDER — ONDANSETRON HCL 4 MG/2ML IJ SOLN: 4.0000 mg | Freq: Four times a day (QID) | INTRAMUSCULAR | Status: DC | PRN

## 2012-01-20 MED ORDER — DIAZEPAM 5 MG PO TABS
10.0000 mg | ORAL_TABLET | Freq: Once | ORAL | Status: AC
  Administered 2012-01-20: 10 mg via ORAL

## 2012-01-20 NOTE — Discharge Instructions (Signed)

## 2012-01-29 DIAGNOSIS — R1310 Dysphagia, unspecified: Secondary | ICD-10-CM | POA: Diagnosis not present

## 2012-01-29 DIAGNOSIS — R109 Unspecified abdominal pain: Secondary | ICD-10-CM | POA: Diagnosis not present

## 2012-02-03 DIAGNOSIS — M5126 Other intervertebral disc displacement, lumbar region: Secondary | ICD-10-CM | POA: Diagnosis not present

## 2012-02-17 DIAGNOSIS — IMO0002 Reserved for concepts with insufficient information to code with codable children: Secondary | ICD-10-CM | POA: Diagnosis not present

## 2012-02-25 DIAGNOSIS — Z4651 Encounter for fitting and adjustment of gastric lap band: Secondary | ICD-10-CM | POA: Diagnosis not present

## 2012-03-10 ENCOUNTER — Encounter (HOSPITAL_COMMUNITY): Payer: Self-pay | Admitting: Pharmacy Technician

## 2012-03-10 ENCOUNTER — Other Ambulatory Visit: Payer: Self-pay | Admitting: Neurosurgery

## 2012-03-10 DIAGNOSIS — M5126 Other intervertebral disc displacement, lumbar region: Secondary | ICD-10-CM | POA: Diagnosis not present

## 2012-03-18 ENCOUNTER — Encounter (HOSPITAL_COMMUNITY)
Admission: RE | Admit: 2012-03-18 | Discharge: 2012-03-18 | Disposition: A | Discharge status: Home / Self Care | Payer: 59 | Source: Ambulatory Visit | Attending: Anesthesiology | Admitting: Anesthesiology

## 2012-03-18 ENCOUNTER — Encounter (HOSPITAL_COMMUNITY)
Admission: RE | Admit: 2012-03-18 | Discharge: 2012-03-18 | Payer: 59 | Source: Ambulatory Visit | Attending: Neurosurgery | Admitting: Neurosurgery

## 2012-03-18 ENCOUNTER — Encounter (HOSPITAL_COMMUNITY): Payer: Self-pay

## 2012-03-18 HISTORY — DX: Headache: R51

## 2012-03-18 HISTORY — DX: Essential (primary) hypertension: I10

## 2012-03-18 HISTORY — DX: Other complications of anesthesia, initial encounter: T88.59XA

## 2012-03-18 HISTORY — DX: Adverse effect of unspecified anesthetic, initial encounter: T41.45XA

## 2012-03-18 HISTORY — DX: Unspecified osteoarthritis, unspecified site: M19.90

## 2012-03-18 LAB — BASIC METABOLIC PANEL
Calcium: 9.5 mg/dL (ref 8.4–10.5)
Chloride: 102 mEq/L (ref 96–112)
Creatinine, Ser: 0.78 mg/dL (ref 0.50–1.35)
GFR calc Af Amer: >90 mL/min (ref >90)
Sodium: 139 mEq/L (ref 135–145)

## 2012-03-18 LAB — CBC
HCT: 43.2 % (ref 39.0–52.0)
Hemoglobin: 14.5 g/dL (ref 13.0–17.0)
MCHC: 33.6 g/dL (ref 30.0–36.0)
RBC: 5.35 MIL/uL (ref 4.22–5.81)
WBC: 9.9 10*3/uL (ref 4.0–10.5)

## 2012-03-18 LAB — DIFFERENTIAL
Basophils Relative: 1 % (ref 0–1)
Lymphocytes Relative: 29 % (ref 12–46)
Monocytes Relative: 6 % (ref 3–12)
Neutro Abs: 6.2 10*3/uL (ref 1.7–7.7)
Neutrophils Relative %: 63 % (ref 43–77)

## 2012-03-18 LAB — TYPE AND SCREEN: Antibody Screen: NEGATIVE

## 2012-03-18 LAB — ABO/RH: ABO/RH(D): O NEG

## 2012-03-18 NOTE — Pre-Procedure Instructions (Signed)
[name]  03/18/2012   Your procedure is scheduled on:  03/22/12  Report to [facility] at 530 AM.  Call this number if you have problems the morning of surgery: [phone]   Remember:   Do not eat food:After Midnight.  May have clear liquids: up to 4 Hours before arrival.  Clear liquids include soda, tea, black coffee, apple or grape juice, broth.  Take these medicines the morning of surgery with A SIP OF WATER: percocet   Do not wear jewelry, make-up or nail polish.  Do not wear lotions, powders, or perfumes. You may wear deodorant.  Do not shave 48 hours prior to surgery.  Do not bring valuables to the hospital.  Contacts, dentures or bridgework may not be worn into surgery.  Leave suitcase in the car. After surgery it may be brought to your room.  For patients admitted to the hospital, checkout time is 11:00 AM the day of discharge.   Patients discharged the day of surgery will not be allowed to drive home.  Name and phone number of your driver: family  Special Instructions: CHG Shower Use Special Wash: 1/2 bottle night before surgery and 1/2 bottle morning of surgery.   Please read over the following fact sheets that you were given: Pain Booklet, Coughing and Deep Breathing, Blood Transfusion Information, MRSA Information and Surgical Site Infection Prevention

## 2012-03-19 ENCOUNTER — Other Ambulatory Visit: Payer: Self-pay | Admitting: Neurosurgery

## 2012-03-21 MED ORDER — VANCOMYCIN HCL 1000 MG IV SOLR
1500.0000 mg | Freq: Once | INTRAVENOUS | Status: AC
  Administered 2012-03-22: 1500 mg via INTRAVENOUS
  Filled 2012-03-21: qty 1500

## 2012-03-21 MED ORDER — DEXAMETHASONE SODIUM PHOSPHATE 10 MG/ML IJ SOLN
10.0000 mg | Freq: Once | INTRAMUSCULAR | Status: AC
  Administered 2012-03-22: 10 mg via INTRAVENOUS
  Filled 2012-03-21 (×2): qty 1

## 2012-03-22 ENCOUNTER — Encounter (HOSPITAL_COMMUNITY): Payer: Self-pay | Admitting: Anesthesiology

## 2012-03-22 ENCOUNTER — Ambulatory Visit (HOSPITAL_COMMUNITY): Payer: 59

## 2012-03-22 ENCOUNTER — Inpatient Hospital Stay (HOSPITAL_COMMUNITY)
Admission: RE | Admit: 2012-03-22 | Discharge: 2012-03-24 | DRG: 460 | Disposition: A | Discharge status: Home / Self Care | Payer: 59 | Source: Ambulatory Visit | Attending: Neurosurgery | Admitting: Neurosurgery

## 2012-03-22 ENCOUNTER — Encounter (HOSPITAL_COMMUNITY)
Admission: RE | Disposition: A | Discharge status: Home / Self Care | Payer: Self-pay | Source: Ambulatory Visit | Attending: Neurosurgery

## 2012-03-22 ENCOUNTER — Ambulatory Visit (HOSPITAL_COMMUNITY): Payer: 59 | Admitting: Anesthesiology

## 2012-03-22 ENCOUNTER — Encounter (HOSPITAL_COMMUNITY): Payer: Self-pay | Admitting: *Deleted

## 2012-03-22 DIAGNOSIS — Z981 Arthrodesis status: Secondary | ICD-10-CM | POA: Diagnosis not present

## 2012-03-22 DIAGNOSIS — I10 Essential (primary) hypertension: Secondary | ICD-10-CM | POA: Diagnosis present

## 2012-03-22 DIAGNOSIS — Z79899 Other long term (current) drug therapy: Secondary | ICD-10-CM

## 2012-03-22 DIAGNOSIS — M48062 Spinal stenosis, lumbar region with neurogenic claudication: Principal | ICD-10-CM | POA: Diagnosis present

## 2012-03-22 DIAGNOSIS — E119 Type 2 diabetes mellitus without complications: Secondary | ICD-10-CM | POA: Diagnosis present

## 2012-03-22 DIAGNOSIS — Z87891 Personal history of nicotine dependence: Secondary | ICD-10-CM | POA: Diagnosis not present

## 2012-03-22 DIAGNOSIS — T84498A Other mechanical complication of other internal orthopedic devices, implants and grafts, initial encounter: Secondary | ICD-10-CM | POA: Diagnosis present

## 2012-03-22 DIAGNOSIS — Y92009 Unspecified place in unspecified non-institutional (private) residence as the place of occurrence of the external cause: Secondary | ICD-10-CM

## 2012-03-22 DIAGNOSIS — Y831 Surgical operation with implant of artificial internal device as the cause of abnormal reaction of the patient, or of later complication, without mention of misadventure at the time of the procedure: Secondary | ICD-10-CM | POA: Diagnosis present

## 2012-03-22 DIAGNOSIS — IMO0002 Reserved for concepts with insufficient information to code with codable children: Secondary | ICD-10-CM | POA: Diagnosis not present

## 2012-03-22 DIAGNOSIS — Z7982 Long term (current) use of aspirin: Secondary | ICD-10-CM | POA: Diagnosis not present

## 2012-03-22 DIAGNOSIS — M48061 Spinal stenosis, lumbar region without neurogenic claudication: Secondary | ICD-10-CM | POA: Diagnosis not present

## 2012-03-22 DIAGNOSIS — Z01812 Encounter for preprocedural laboratory examination: Secondary | ICD-10-CM

## 2012-03-22 LAB — GLUCOSE, CAPILLARY
Glucose-Capillary: 130 mg/dL — ABNORMAL HIGH (ref 70–99)
Glucose-Capillary: 207 mg/dL — ABNORMAL HIGH (ref 70–99)
Glucose-Capillary: 193 mg/dL — ABNORMAL HIGH (ref 70–99)

## 2012-03-22 SURGERY — POSTERIOR LUMBAR FUSION 1 WITH HARDWARE REMOVAL
Anesthesia: General | Site: Back | Wound class: Clean

## 2012-03-22 SURGERY — POSTERIOR LUMBAR FUSION 1 LEVEL
Anesthesia: General | Site: Back

## 2012-03-22 MED ORDER — SODIUM CHLORIDE 0.9 % IJ SOLN
3.0000 mL | Freq: Two times a day (BID) | INTRAMUSCULAR | Status: DC
  Administered 2012-03-22 – 2012-03-24 (×4): 3 mL via INTRAVENOUS

## 2012-03-22 MED ORDER — ONDANSETRON HCL 4 MG/2ML IJ SOLN: 4.0000 mg | INTRAMUSCULAR | Status: DC | PRN

## 2012-03-22 MED ORDER — BUPIVACAINE HCL (PF) 0.25 % IJ SOLN
INTRAMUSCULAR | Status: DC | PRN
  Administered 2012-03-22: 20 mL

## 2012-03-22 MED ORDER — POLYETHYLENE GLYCOL 3350 17 G PO PACK
17.0000 g | PACK | Freq: Every day | ORAL | Status: DC | PRN
  Filled 2012-03-22: qty 1

## 2012-03-22 MED ORDER — 0.9 % SODIUM CHLORIDE (POUR BTL) OPTIME
TOPICAL | Status: DC | PRN
  Administered 2012-03-22: 1000 mL

## 2012-03-22 MED ORDER — HYDROMORPHONE HCL PF 1 MG/ML IJ SOLN
0.2500 mg | INTRAMUSCULAR | Status: DC | PRN
  Administered 2012-03-22 (×4): 0.5 mg via INTRAVENOUS

## 2012-03-22 MED ORDER — SODIUM CHLORIDE 0.9 % IJ SOLN: 3.0000 mL | INTRAMUSCULAR | Status: DC | PRN

## 2012-03-22 MED ORDER — HYDROCODONE-ACETAMINOPHEN 5-325 MG PO TABS: 1.0000 | ORAL_TABLET | ORAL | Status: DC | PRN

## 2012-03-22 MED ORDER — ZOLPIDEM TARTRATE 10 MG PO TABS
10.0000 mg | ORAL_TABLET | Freq: Every evening | ORAL | Status: DC | PRN
  Administered 2012-03-23: 10 mg via ORAL
  Filled 2012-03-22: qty 1

## 2012-03-22 MED ORDER — ASPIRIN EC 81 MG PO TBEC
81.0000 mg | DELAYED_RELEASE_TABLET | Freq: Every day | ORAL | Status: DC
  Administered 2012-03-22 – 2012-03-24 (×3): 81 mg via ORAL
  Filled 2012-03-22 (×3): qty 1

## 2012-03-22 MED ORDER — LIDOCAINE HCL (CARDIAC) 20 MG/ML IV SOLN
INTRAVENOUS | Status: DC | PRN
  Administered 2012-03-22: 100 mg via INTRAVENOUS

## 2012-03-22 MED ORDER — LACTATED RINGERS IV SOLN
INTRAVENOUS | Status: DC | PRN
  Administered 2012-03-22 (×3): via INTRAVENOUS

## 2012-03-22 MED ORDER — METFORMIN HCL 500 MG PO TABS
500.0000 mg | ORAL_TABLET | Freq: Two times a day (BID) | ORAL | Status: DC
  Administered 2012-03-22 – 2012-03-24 (×4): 500 mg via ORAL
  Filled 2012-03-22 (×6): qty 1

## 2012-03-22 MED ORDER — SUCCINYLCHOLINE CHLORIDE 20 MG/ML IJ SOLN
INTRAMUSCULAR | Status: DC | PRN
  Administered 2012-03-22: 140 mg via INTRAVENOUS

## 2012-03-22 MED ORDER — HYDROMORPHONE HCL PF 1 MG/ML IJ SOLN
0.5000 mg | INTRAMUSCULAR | Status: DC | PRN
  Administered 2012-03-22 – 2012-03-23 (×7): 1 mg via INTRAVENOUS
  Filled 2012-03-22 (×8): qty 1

## 2012-03-22 MED ORDER — DIAZEPAM 5 MG PO TABS
5.0000 mg | ORAL_TABLET | Freq: Four times a day (QID) | ORAL | Status: DC | PRN
  Administered 2012-03-22 – 2012-03-24 (×6): 10 mg via ORAL
  Filled 2012-03-22 (×6): qty 2

## 2012-03-22 MED ORDER — LOSARTAN POTASSIUM 50 MG PO TABS: 100.0000 mg | ORAL_TABLET | Freq: Every day | ORAL | Status: DC

## 2012-03-22 MED ORDER — HYDROMORPHONE HCL PF 1 MG/ML IJ SOLN
INTRAMUSCULAR | Status: AC
  Filled 2012-03-22: qty 1

## 2012-03-22 MED ORDER — SURGIFOAM 100 EX MISC
CUTANEOUS | Status: DC | PRN
  Administered 2012-03-22 (×2): via TOPICAL

## 2012-03-22 MED ORDER — VANCOMYCIN HCL IN DEXTROSE 1-5 GM/200ML-% IV SOLN
1000.0000 mg | Freq: Two times a day (BID) | INTRAVENOUS | Status: DC
  Administered 2012-03-22 – 2012-03-24 (×4): 1000 mg via INTRAVENOUS
  Filled 2012-03-22 (×5): qty 200

## 2012-03-22 MED ORDER — CYCLOBENZAPRINE HCL 10 MG PO TABS
10.0000 mg | ORAL_TABLET | Freq: Three times a day (TID) | ORAL | Status: DC | PRN
  Administered 2012-03-22 – 2012-03-24 (×2): 10 mg via ORAL
  Filled 2012-03-22: qty 1

## 2012-03-22 MED ORDER — ACETAMINOPHEN 325 MG PO TABS: 650.0000 mg | ORAL_TABLET | ORAL | Status: DC | PRN

## 2012-03-22 MED ORDER — PHENOL 1.4 % MT LIQD: 1.0000 | OROMUCOSAL | Status: DC | PRN

## 2012-03-22 MED ORDER — ADULT MULTIVITAMIN W/MINERALS CH
1.0000 | ORAL_TABLET | Freq: Every day | ORAL | Status: DC
  Administered 2012-03-22 – 2012-03-24 (×3): 1 via ORAL
  Filled 2012-03-22 (×3): qty 1

## 2012-03-22 MED ORDER — BISACODYL 10 MG RE SUPP
10.0000 mg | Freq: Every day | RECTAL | Status: DC | PRN
  Administered 2012-03-24: 10 mg via RECTAL
  Filled 2012-03-22: qty 1

## 2012-03-22 MED ORDER — ASPIRIN 81 MG PO TBEC: 81.0000 mg | DELAYED_RELEASE_TABLET | Freq: Every day | ORAL | Status: DC

## 2012-03-22 MED ORDER — OXYCODONE-ACETAMINOPHEN 5-325 MG PO TABS
1.0000 | ORAL_TABLET | ORAL | Status: DC | PRN
  Administered 2012-03-22 – 2012-03-24 (×9): 2 via ORAL
  Filled 2012-03-22 (×9): qty 2

## 2012-03-22 MED ORDER — FLEET ENEMA 7-19 GM/118ML RE ENEM: 1.0000 | ENEMA | Freq: Once | RECTAL | Status: AC | PRN

## 2012-03-22 MED ORDER — PROPOFOL 10 MG/ML IV EMUL
INTRAVENOUS | Status: DC | PRN
  Administered 2012-03-22: 200 mg via INTRAVENOUS

## 2012-03-22 MED ORDER — NEOSTIGMINE METHYLSULFATE 1 MG/ML IJ SOLN
INTRAMUSCULAR | Status: DC | PRN
  Administered 2012-03-22: 5 mg via INTRAVENOUS

## 2012-03-22 MED ORDER — MIDAZOLAM HCL 5 MG/5ML IJ SOLN
INTRAMUSCULAR | Status: DC | PRN
  Administered 2012-03-22: 2 mg via INTRAVENOUS

## 2012-03-22 MED ORDER — ACETAMINOPHEN 650 MG RE SUPP: 650.0000 mg | RECTAL | Status: DC | PRN

## 2012-03-22 MED ORDER — GLYCOPYRROLATE 0.2 MG/ML IJ SOLN
INTRAMUSCULAR | Status: DC | PRN
  Administered 2012-03-22: .8 mg via INTRAVENOUS

## 2012-03-22 MED ORDER — SENNA 8.6 MG PO TABS
1.0000 | ORAL_TABLET | Freq: Two times a day (BID) | ORAL | Status: DC
  Administered 2012-03-23 – 2012-03-24 (×3): 8.6 mg via ORAL
  Filled 2012-03-22 (×5): qty 1

## 2012-03-22 MED ORDER — DIAZEPAM 5 MG PO TABS: 5.0000 mg | ORAL_TABLET | Freq: Four times a day (QID) | ORAL | Status: DC | PRN

## 2012-03-22 MED ORDER — SODIUM CHLORIDE 0.9 % IV SOLN
INTRAVENOUS | Status: DC | PRN
  Administered 2012-03-22: 10:00:00 via INTRAVENOUS

## 2012-03-22 MED ORDER — LOSARTAN POTASSIUM 50 MG PO TABS
100.0000 mg | ORAL_TABLET | Freq: Every day | ORAL | Status: DC
  Administered 2012-03-22 – 2012-03-24 (×3): 100 mg via ORAL
  Filled 2012-03-22 (×3): qty 2

## 2012-03-22 MED ORDER — FENTANYL CITRATE 0.05 MG/ML IJ SOLN
INTRAMUSCULAR | Status: DC | PRN
  Administered 2012-03-22: 150 ug via INTRAVENOUS
  Administered 2012-03-22 (×4): 50 ug via INTRAVENOUS
  Administered 2012-03-22: 100 ug via INTRAVENOUS

## 2012-03-22 MED ORDER — MENTHOL 3 MG MT LOZG
1.0000 | LOZENGE | OROMUCOSAL | Status: DC | PRN
  Filled 2012-03-22: qty 9

## 2012-03-22 MED ORDER — METFORMIN HCL 500 MG PO TABS: 500.0000 mg | ORAL_TABLET | Freq: Two times a day (BID) | ORAL | Status: DC

## 2012-03-22 MED ORDER — DROPERIDOL 2.5 MG/ML IJ SOLN: 0.6250 mg | INTRAMUSCULAR | Status: DC | PRN

## 2012-03-22 MED ORDER — ONDANSETRON HCL 4 MG/2ML IJ SOLN
INTRAMUSCULAR | Status: DC | PRN
  Administered 2012-03-22: 4 mg via INTRAVENOUS

## 2012-03-22 MED ORDER — ALUM & MAG HYDROXIDE-SIMETH 200-200-20 MG/5ML PO SUSP: 30.0000 mL | Freq: Four times a day (QID) | ORAL | Status: DC | PRN

## 2012-03-22 MED ORDER — CYCLOBENZAPRINE HCL 10 MG PO TABS
ORAL_TABLET | ORAL | Status: AC
  Filled 2012-03-22: qty 1

## 2012-03-22 MED ORDER — ONE-DAILY MULTI VITAMINS PO TABS: 1.0000 | ORAL_TABLET | Freq: Every day | ORAL | Status: DC

## 2012-03-22 MED ORDER — SODIUM CHLORIDE 0.9 % IR SOLN
Status: DC | PRN
  Administered 2012-03-22: 10:00:00

## 2012-03-22 MED ORDER — SODIUM CHLORIDE 0.9 % IV SOLN: 250.0000 mL | INTRAVENOUS | Status: DC

## 2012-03-22 MED ORDER — VECURONIUM BROMIDE 10 MG IV SOLR
INTRAVENOUS | Status: DC | PRN
  Administered 2012-03-22: 2 mg via INTRAVENOUS
  Administered 2012-03-22: 7 mg via INTRAVENOUS
  Administered 2012-03-22: 3 mg via INTRAVENOUS
  Administered 2012-03-22: 1 mg via INTRAVENOUS
  Administered 2012-03-22: 3 mg via INTRAVENOUS

## 2012-03-22 SURGICAL SUPPLY — 67 items
ADH SKN CLS APL DERMABOND .7 (GAUZE/BANDAGES/DRESSINGS) ×1
APL SKNCLS STERI-STRIP NONHPOA (GAUZE/BANDAGES/DRESSINGS) ×1
BAG DECANTER FOR FLEXI CONT (MISCELLANEOUS) ×2 IMPLANT
BENZOIN TINCTURE PRP APPL 2/3 (GAUZE/BANDAGES/DRESSINGS) ×2 IMPLANT
BLADE SURG ROTATE 9660 (MISCELLANEOUS) ×1 IMPLANT
BRUSH SCRUB EZ PLAIN DRY (MISCELLANEOUS) ×2 IMPLANT
BUR MATCHSTICK NEURO 3.0 LAGG (BURR) ×2 IMPLANT
CANISTER SUCTION 2500CC (MISCELLANEOUS) ×2 IMPLANT
CAP LCK SPNE (Orthopedic Implant) ×2 IMPLANT
CAP LOCK SPINE RADIUS (Orthopedic Implant) IMPLANT
CAP LOCKING (Orthopedic Implant) ×4 IMPLANT
CAP LOCKING 90D50000LTS (MISCELLANEOUS) ×2 IMPLANT
CLOTH BEACON ORANGE TIMEOUT ST (SAFETY) ×2 IMPLANT
CONT SPEC 4OZ CLIKSEAL STRL BL (MISCELLANEOUS) ×4 IMPLANT
COVER BACK TABLE 24X17X13 BIG (DRAPES) IMPLANT
COVER TABLE BACK 60X90 (DRAPES) ×2 IMPLANT
DECANTER SPIKE VIAL GLASS SM (MISCELLANEOUS) ×1 IMPLANT
DERMABOND ADVANCED (GAUZE/BANDAGES/DRESSINGS) ×1
DERMABOND ADVANCED .7 DNX12 (GAUZE/BANDAGES/DRESSINGS) ×1 IMPLANT
DRAPE C-ARM 42X72 X-RAY (DRAPES) ×4 IMPLANT
DRAPE LAPAROTOMY 100X72X124 (DRAPES) ×2 IMPLANT
DRAPE POUCH INSTRU U-SHP 10X18 (DRAPES) ×2 IMPLANT
DRAPE PROXIMA HALF (DRAPES) IMPLANT
DRAPE SURG 17X23 STRL (DRAPES) ×8 IMPLANT
ELECT REM PT RETURN 9FT ADLT (ELECTROSURGICAL) ×2
ELECTRODE REM PT RTRN 9FT ADLT (ELECTROSURGICAL) ×1 IMPLANT
EVACUATOR 1/8 PVC DRAIN (DRAIN) ×2 IMPLANT
GAUZE SPONGE 4X4 16PLY XRAY LF (GAUZE/BANDAGES/DRESSINGS) ×1 IMPLANT
GLOVE BIO SURGEON STRL SZ8 (GLOVE) ×1 IMPLANT
GLOVE BIOGEL PI IND STRL 7.0 (GLOVE) IMPLANT
GLOVE BIOGEL PI IND STRL 8.5 (GLOVE) IMPLANT
GLOVE BIOGEL PI INDICATOR 7.0 (GLOVE) ×2
GLOVE BIOGEL PI INDICATOR 8.5 (GLOVE) ×1
GLOVE ECLIPSE 8.5 STRL (GLOVE) ×5 IMPLANT
GLOVE EXAM NITRILE LRG STRL (GLOVE) IMPLANT
GLOVE EXAM NITRILE MD LF STRL (GLOVE) ×2 IMPLANT
GLOVE EXAM NITRILE XL STR (GLOVE) IMPLANT
GLOVE EXAM NITRILE XS STR PU (GLOVE) IMPLANT
GLOVE SURG SS PI 7.0 STRL IVOR (GLOVE) ×4 IMPLANT
GOWN BRE IMP SLV AUR LG STRL (GOWN DISPOSABLE) IMPLANT
GOWN BRE IMP SLV AUR XL STRL (GOWN DISPOSABLE) ×5 IMPLANT
GOWN STRL REIN 2XL LVL4 (GOWN DISPOSABLE) ×2 IMPLANT
KIT BASIN OR (CUSTOM PROCEDURE TRAY) ×2 IMPLANT
KIT ROOM TURNOVER OR (KITS) ×2 IMPLANT
Locking Cap ×2 IMPLANT
MILL MEDIUM DISP (BLADE) ×1 IMPLANT
NEEDLE HYPO 22GX1.5 SAFETY (NEEDLE) ×2 IMPLANT
NS IRRIG 1000ML POUR BTL (IV SOLUTION) ×2 IMPLANT
PACK LAMINECTOMY NEURO (CUSTOM PROCEDURE TRAY) ×2 IMPLANT
PUTTY BONE DBX 5CC MIX (Putty) ×1 IMPLANT
ROD RADIUS 40MM (Neuro Prosthesis/Implant) ×4 IMPLANT
ROD SPNL 40X5.5XNS TI RDS (Neuro Prosthesis/Implant) IMPLANT
SCREW 6.75X50MM (Screw) ×2 IMPLANT
SPONGE GAUZE 4X4 12PLY (GAUZE/BANDAGES/DRESSINGS) ×2 IMPLANT
SPONGE SURGIFOAM ABS GEL 100 (HEMOSTASIS) ×3 IMPLANT
STRIP CLOSURE SKIN 1/2X4 (GAUZE/BANDAGES/DRESSINGS) ×4 IMPLANT
SUT VIC AB 0 CT1 18XCR BRD8 (SUTURE) ×2 IMPLANT
SUT VIC AB 0 CT1 8-18 (SUTURE) ×4
SUT VIC AB 2-0 CT1 18 (SUTURE) ×3 IMPLANT
SUT VIC AB 3-0 SH 8-18 (SUTURE) ×4 IMPLANT
SYR 20ML ECCENTRIC (SYRINGE) ×1 IMPLANT
TELAMON 22X10 (Cage) ×1 IMPLANT
TOWEL OR 17X24 6PK STRL BLUE (TOWEL DISPOSABLE) ×2 IMPLANT
TOWEL OR 17X26 10 PK STRL BLUE (TOWEL DISPOSABLE) ×2 IMPLANT
TRAY FOLEY CATH 14FRSI W/METER (CATHETERS) ×2 IMPLANT
WATER STERILE IRR 1000ML POUR (IV SOLUTION) ×2 IMPLANT
WEDGE TANGENT 10X26MM ×1 IMPLANT

## 2012-03-22 NOTE — Brief Op Note (Signed)
03/22/2012  11:31 AM  PATIENT:  Narda Bonds  56 y.o. male  PRE-OPERATIVE DIAGNOSIS:  Lumbar two-three stenosis, Possible pseudoarthrosis Lumbar five-Sacral one  POST-OPERATIVE DIAGNOSIS:  Lumbar two-three stenosis, Possible pseudoarthrosis Lumbar five-Sacral one  PROCEDURE:  Procedure(s) (LRB): POSTERIOR LUMBAR FUSION 1 WITH HARDWARE REMOVAL (N/A)  SURGEON:  Surgeon(s) and Role:    * Temple Pacini, MD - Primary    * Mariam Dollar, MD - Assisting  PHYSICIAN ASSISTANT:   ASSISTANTS: Dr. Wynetta Emery   ANESTHESIA:   general  EBL:  Total I/O In: 3065 [I.V.:2600; Blood:465] Out: 1175 [Urine:175; Blood:1000]  BLOOD ADMINISTERED:none  DRAINS: (Medium) Hemovact drain(s) in the Epidural with  Suction Open   LOCAL MEDICATIONS USED:  MARCAINE     SPECIMEN:  No Specimen  DISPOSITION OF SPECIMEN:  N/A  COUNTS:  YES  TOURNIQUET:  * No tourniquets in log *  DICTATION: .Dragon Dictation  PLAN OF CARE: Admit to inpatient   PATIENT DISPOSITION:  PACU - hemodynamically stable.   Delay start of Pharmacological VTE agent (>24hrs) due to surgical blood loss or risk of bleeding: yes

## 2012-03-22 NOTE — Preoperative (Signed)
Beta Blockers   Reason not to administer Beta Blockers:Not Applicable 

## 2012-03-22 NOTE — H&P (Signed)
Casey [name] is an 56 y.o. male.   Chief Complaint: Back and bilateral lower extremity pain HPI: 56 year old male with history of previous L3-S1 decompression and fusion. Patient presents with worsening back and bilateral lower trimming a pain consistent with neurogenic claudication. Workup demonstrates evidence of progressive adjacent level stenosis at L2-3. Patient presents now for L3 through decompression and fusion. Fusion appears solid at L3-4 and L4-5 although some question of hardware loosening and possible pseudarthrosis at L5-S1. This will require reexploration of the time of decompression at L2-3.  Past Medical History  Diagnosis Date  . Complication of anesthesia     diff. waking up  . Diabetes mellitus   . Arthritis   . Headache   . Hypertension     dr Lupe Carney    Past Surgical History  Procedure Date  . Back surgery     5  back surgeries  . Hernia repair   . Laparoscopic gastric banding   . Mandible fracture surgery     x 2  . Carpal tunnel release     bil  . Tonsillectomy   . Colon surgery     2004 for colon rupture    History reviewed. No pertinent family history. Social History:  reports that he quit smoking about 6 years ago. His smoking use included Cigarettes. He has a 13.5 pack-year smoking history. He has never used smokeless tobacco. He reports that he does not drink alcohol or use illicit drugs.  Allergies:  Allergies  Allergen Reactions  . Penicillins Hives    When he was 9    Medications Prior to Admission  Medication Dose Route Frequency Provider Last Rate Last Dose  . dexamethasone (DECADRON) injection 10 mg  10 mg Intravenous Once Temple Pacini, MD      . vancomycin (VANCOCIN) 1,500 mg in sodium chloride 0.9 % 500 mL IVPB  1,500 mg Intravenous Once Temple Pacini, MD       No current outpatient prescriptions on file as of 03/22/2012.    Results for orders placed during the hospital encounter of 03/22/12 (from the past 48 hour(s))    GLUCOSE, CAPILLARY     Status: Abnormal   Collection Time   03/22/12  6:34 AM      Component Value Range Comment   Glucose-Capillary 130 (*) 70 - 99 (mg/dL)    No results found.  Review of Systems  Constitutional: Negative.   HENT: Negative.   Eyes: Negative.   Respiratory: Negative.   Cardiovascular: Negative.   Gastrointestinal: Negative.   Genitourinary: Negative.   Musculoskeletal: Negative.   Skin: Negative.   Neurological: Negative.   Endo/Heme/Allergies: Negative.   Psychiatric/Behavioral: Negative.     Blood pressure 135/84, pulse 61, temperature 98 F (36.7 C), temperature source Oral, resp. rate 18, SpO2 98.00%. Physical Exam  Constitutional: He is oriented to person, place, and time. He appears well-developed and well-nourished.  HENT:  Head: Normocephalic and atraumatic.  Right Ear: External ear normal.  Left Ear: External ear normal.  Nose: Nose normal.  Mouth/Throat: Oropharynx is clear and moist.  Eyes: Conjunctivae and EOM are normal. Pupils are equal, round, and reactive to light.  Neck: Normal range of motion. Neck supple. No tracheal deviation present. No thyromegaly present.  Cardiovascular: Normal rate, regular rhythm, normal heart sounds and intact distal pulses.   Respiratory: Effort normal and breath sounds normal. No respiratory distress. He has no wheezes.  GI: Soft. Bowel sounds are normal.  Musculoskeletal:  Normal range of motion. He exhibits no edema and no tenderness.  Neurological: He is alert and oriented to person, place, and time. He has normal reflexes. No cranial nerve deficit. Coordination normal.  Skin: Skin is warm and dry. No rash noted. No erythema. No pallor.  Psychiatric: He has a normal mood and affect. His behavior is normal. Judgment and thought content normal.     Assessment/Plan L2-3 stenosis status post L3-S1 decompression and fusion. Plan L2-3 redo decompressive laminectomy with bilateral foraminotomies. L2-3 posterior  lumbar my fusion using tangent interbody allograft wedge Telamon interbody peek cage and local autographing. L2-3 posterior lateral arthrodesis utilizing nonsegmental pedicle screw sedation and local autographing. Reexploration of previous L3-S1 fusion with removal of hardware possible augmentation of the L5-S1 fusion. Risks and benefits have been explained patient wishes to proceed.  Kristiann Noyce A 03/22/2012, 7:46 AM

## 2012-03-22 NOTE — Anesthesia Preprocedure Evaluation (Addendum)
Anesthesia Evaluation  Patient identified by MRN, date of birth, ID band Patient awake    History of Anesthesia Complications (+) PROLONGED EMERGENCE  Airway Mallampati: III TM Distance: <3 FB Neck ROM: Full    Dental  (+) Teeth Intact and Dental Advisory Given   Pulmonary neg pulmonary ROS, former smoker breath sounds clear to auscultation  Pulmonary exam normal       Cardiovascular hypertension, Pt. on medications Rhythm:Regular Rate:Normal     Neuro/Psych  Headaches,    GI/Hepatic negative GI ROS, Neg liver ROS,   Endo/Other  Diabetes mellitus-, Well Controlled, Type 2, Oral Hypoglycemic AgentsMorbid obesity  Renal/GU negative Renal ROS     Musculoskeletal   Abdominal (+)  Abdomen: soft. Bowel sounds: normal.  Peds  Hematology   Anesthesia Other Findings   Reproductive/Obstetrics                         Anesthesia Physical Anesthesia Plan  ASA: III  Anesthesia Plan: General   Post-op Pain Management:    Induction: Intravenous  Airway Management Planned: Oral ETT and Video Laryngoscope Planned  Additional Equipment:   Intra-op Plan:   Post-operative Plan:   Informed Consent: I have reviewed the patients History and Physical, chart, labs and discussed the procedure including the risks, benefits and alternatives for the proposed anesthesia with the patient or authorized representative who has indicated his/her understanding and acceptance.   Dental advisory given  Plan Discussed with: CRNA, Anesthesiologist and Surgeon  Anesthesia Plan Comments:         Anesthesia Quick Evaluation

## 2012-03-22 NOTE — Progress Notes (Signed)
ANTIBIOTIC CONSULT NOTE - INITIAL  Pharmacy Consult for Vancomycin Indication: Prophylaxis s/p posterior lumbar fusion with hardware [name]; Hemovac drain placed.  Allergies  Allergen Reactions  . Penicillins Hives    When he was 9    Patient Measurements: Height: 5\' 10"  (177.8 cm) Weight: 243 lb 6.2 oz (110.4 kg) IBW/kg (Calculated) : 73    Vital Signs: Temp: 97.5 F (36.4 C) (04/22 1353) Temp src: Oral (04/22 1353) BP: 144/88 mmHg (04/22 1353) Pulse Rate: 66  (04/22 1353) Intake/Output from previous day:   Intake/Output from this shift: Total I/O In: 3065 [I.V.:2600; Blood:465] Out: 1325 [Urine:275; Drains:50; Blood:1000]  Labs: No results found for this basename: WBC:3,HGB:3,PLT:3,LABCREA:3,CREATININE:3 in the last 72 hours Estimated Creatinine Clearance: 131.4 ml/min (by C-G formula based on Cr of 0.78). No results found for this basename: VANCOTROUGH:2,VANCOPEAK:2,VANCORANDOM:2,GENTTROUGH:2,GENTPEAK:2,GENTRANDOM:2,TOBRATROUGH:2,TOBRAPEAK:2,TOBRARND:2,AMIKACINPEAK:2,AMIKACINTROU:2,AMIKACIN:2, in the last 72 hours   Microbiology: Recent Results (from the past 720 hour(s))  SURGICAL PCR SCREEN     Status: Abnormal   Collection Time   03/18/12 12:13 PM      Component Value Range Status Comment   MRSA, PCR NEGATIVE  NEGATIVE  Final    Staphylococcus aureus POSITIVE (*) NEGATIVE  Final     Medical History: Past Medical History  Diagnosis Date  . Complication of anesthesia     diff. waking up  . Diabetes mellitus   . Arthritis   . Headache   . Hypertension     dr Casey [name]   pcp    Medications:  Prescriptions prior to admission  Medication Sig Dispense Refill  . aspirin 81 MG EC tablet Take 81 mg by mouth daily. Swallow whole.      . losartan (COZAAR) 100 MG tablet Take 100 mg by mouth daily.      . metFORMIN (GLUCOPHAGE) 1000 MG tablet Take 500 mg by mouth 2 (two) times daily with a meal. Need to be cut: had lap band surgery      . Multiple Vitamin  (MULTIVITAMIN) tablet Take 1 tablet by mouth daily.      [name] oxyCODONE-acetaminophen (PERCOCET) 5-325 MG per tablet Take 1 tablet by mouth every 4 (four) hours as needed. 1-2 tablets every 4 hours as needed for pain       Scheduled:    . aspirin EC  81 mg Oral Daily  . cyclobenzaprine      . dexamethasone  10 mg Intravenous Once  . HYDROmorphone      . HYDROmorphone      . losartan  100 mg Oral Daily  . metFORMIN  500 mg Oral BID WC  . mulitivitamin with minerals  1 tablet Oral Daily  . senna  1 tablet Oral BID  . sodium chloride  3 mL Intravenous Q12H  . vancomycin  1,500 mg Intravenous Once  . DISCONTD: aspirin  81 mg Oral Daily  . DISCONTD: losartan  100 mg Oral Daily  . DISCONTD: metFORMIN  500 mg Oral BID WC  . DISCONTD: multivitamin  1 tablet Oral Daily   Assessment: 56 yo male s/p lumbar fusion with hardware [name]  Vancomycin 1500mg  IV preop given @0756  this AM.  Hemovac drain placed, thus Vancomycin to continue until DC'd by MD. Estimated CrCl >100 ml/min.   Goal of Therapy:  Vancomycin trough level 10-15 mcg/ml  Plan:  Vancomycin 1000mg  IV q12hr start at 20:00 tonight.  Steady state vancomycin trough if continued > 5days or if renal function changes.   Arman Filter, RPh 03/22/2012,2:43 PM

## 2012-03-22 NOTE — Op Note (Signed)
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  Probable L5-S1 pseudoarthrosis  Postoperative diagnosis: Same  Procedure Name: L2-3 redo decompressive laminectomy with bilateral L2 and L3 decompressive foraminotomies, more than would be required for simple interbody fusion alone.  L2-3 posterior lumbar interbody fusion utilizing tangent interbody allograft wedge Telamon interbody peek cage and local autograft.  L2-3 posterior lateral arthrodesis utilizing nonsegmental pedicle screw is mentation and local autograft.  Reexploration of L3-S1 posterior lateral fusion for possible pseudoarthrosis.  Removal of L3-S1 segmental instrumentation.  Revision/augmentation of L5-S1 posterior lateral arthrodesis utilizing local autograft and morselized allograft.  Surgeon:Marteze Vecchio A.Kemonie Cutillo, M.D.  Asst. Surgeon: Wynetta Emery  Anesthesia: General  Indication: 56 year old male with progressive back pain and neurogenic claudication. Patient status post previous L3-S1 decompression and fusion. Workup demonstrates evidence of severe adjacent level stenosis at L2-3. Workup also demonstrates loosening of the patient's S1 pedicle screws. Patient appears to have solid posterior lateral bone on CT scan but this is unclear as to whether this represents a true pseudarthrosis or not at L5-S1. Plan is to reexplore this level as well as performing decompression and fusion the L2-3 level.  Operative note: After induction anesthesia, patient positioned prone per protocol. Lumbar would prepped and draped sterilely. Incision was made and subperiosteal dissection then performed exposing the lamina of L2 and the lateral facets and pedicle screw instrumentation at L3 L4-L5 and S1. Deep self-retaining traction placed intraoperative fluoroscopy is used levels were  confirmed. Pedicle screw instrumentation from L3-S1 was disassembled and removed. The screws at S1 were in fact loose. The posterior fusion mass at L5-S1 was further explored. I cannot demonstrate a true pseudoarthrosis but given the loosened hardware head of suspected there was some element of pseudarthrosis ongoing at this level. With that in mind I aggressively decorticated the lateral bone mass and transverse processes and packed morselize autograft and cancellus allograft posterolaterally for later fusion. I do not feel that further is mentation would be beneficial at this level.  Attention was placed the L2-3 level. Complete laminectomy of L2 was then performed using Leksell rongeurs [employer] years high-speed drill to remove the entire lamina of L2 interfacet spell to superior facet of L3. Ligament flavum and epidural scar elevated and resected the fashion. Wide decompressive foraminotomies were then performed of course exiting L2 and L3 nerve roots bilaterally. Bilateral discectomies were then performed at L2-3 disc spaces distracted to 10 mm a 10 mm distractors left the patient's left side. Thecal sac and respecting the right side. The spaces and reamed and then cut with 10 mm tangent is Mr. soft tissues removed and interspace. 10 x 22 mm Telamon cage packed with morselized autograft and packed into place. Distractors in the patient's left side. Thecal sac or respect and left side. The spaces and reamed and then cut with a 10 mm tangent instruments. The spaces further curettage. Morselize autograft was packed into the interspace for later fusion. 10 x 26 mm tangent interbody allograft wedge was then impacted in place recessed roughly 2 mm the posterior margin of L 2. Pedicles of L2 were identified bilaterally using surface landmarks and intraoperative fluoroscopy. Superficial bone was removed overlying the pedicles using high-speed drill. Pedicles then probed using a pedicle awl each pedicle awl track was  then tapped with a 5.25 mm screw temperature Olson probed and found to be solidly within bone. 6.75 x 50 mm radius  screws placed bilaterally at L2. Transverse processes of L2 and L3 were decorticated using high-speed drill. Morselized autograft packed posterolaterally for later fusion. Short segment titanium rod and placed over the screw heads. Locking caps and placed over the screw heads. Locking caps and engaged with the construct under compression. Final images revealed good position the bone graft and hardware were at the proper upper level with normal lamina spine. Wounds that area one final time. Gelfoam was placed off for hemostasis. Hemovac drain was of the epidural space. Wounds and close in layers with Vicryl sutures. Steri-Strips and sterile dressing were applied. There were no apparent outpatient. Patient will and he returns they're covering postop.

## 2012-03-22 NOTE — Anesthesia Procedure Notes (Signed)
Procedure Name: Intubation Date/Time: 03/22/2012 7:47 AM Performed by: Carmela Rima Pre-anesthesia Checklist: Emergency Drugs available, Patient identified, Timeout performed, Suction available and Patient being monitored Patient Re-evaluated:Patient Re-evaluated prior to inductionOxygen Delivery Method: Circle system utilized Preoxygenation: Pre-oxygenation with 100% oxygen Intubation Type: IV induction Ventilation: Mask ventilation without difficulty Laryngoscope Size: Mac Grade View: Grade III Tube size: 7.5 mm Number of attempts: 2 Airway Equipment and Method: Video-laryngoscopy Placement Confirmation: ETT inserted through vocal cords under direct vision,  breath sounds checked- equal and bilateral and positive ETCO2 Secured at: 23 cm Tube secured with: Tape Dental Injury: Teeth and Oropharynx as per pre-operative assessment

## 2012-03-22 NOTE — Anesthesia Postprocedure Evaluation (Signed)
Anesthesia Post Note  Patient: Casey [name]  Procedure(s) Performed: Procedure(s) (LRB): POSTERIOR LUMBAR FUSION 1 WITH HARDWARE REMOVAL (N/A)  Anesthesia type: general  Patient location: PACU  Post pain: Pain level controlled  Post assessment: Patient's Cardiovascular Status Stable  Last Vitals:  Filed Vitals:   03/22/12 1230  BP: 174/95  Pulse: 77  Temp:   Resp: 14    Post vital signs: Reviewed and stable  Level of consciousness: sedated  Complications: No apparent anesthesia complications

## 2012-03-22 NOTE — Transfer of Care (Signed)
Immediate Anesthesia Transfer of Care Note  Patient: Casey [name]  Procedure(s) Performed: Procedure(s) (LRB): POSTERIOR LUMBAR FUSION 1 WITH HARDWARE REMOVAL (N/A)  Patient Location: PACU  Anesthesia Type: General  Level of Consciousness: awake, alert  and oriented  Airway & Oxygen Therapy: Patient Spontanous Breathing  Post-op Assessment: Report given to PACU RN, Post -op Vital signs reviewed and stable and Patient moving all extremities X 4  Post vital signs: Reviewed and stable  Complications: No apparent anesthesia complications

## 2012-03-23 LAB — GLUCOSE, CAPILLARY: Glucose-Capillary: 161 mg/dL — ABNORMAL HIGH (ref 70–99)

## 2012-03-23 NOTE — Evaluation (Signed)
Occupational Therapy Evaluation Patient Details Name: Casey [name] MRN: [id number] DOB: 1955/12/26 Today's Date: 03/23/2012 Time: 4098-1191 OT Time Calculation (min): 12 min  OT Assessment / Plan / Recommendation Clinical Impression  Pt. presents s/p PLIF with hardware removal and with increased pain. All education completed with pt. and pt. able to verbalize and demonstrate all precautions. Recommend D/C home with assist from wife PRN and sign off acutely.     OT Assessment  Patient does not need any further OT services    Follow Up Recommendations  No OT follow up    Equipment Recommendations  None recommended by OT    Frequency      Precautions / Restrictions Precautions Precautions: Back Precaution Booklet Issued: Yes (comment) Precaution Comments: Reviewed 3/3 back precautions with patietn and wife. Required Braces or Orthoses: Spinal Brace Spinal Brace: Lumbar corset;Applied in sitting position Restrictions Weight Bearing Restrictions: No   Pertinent Vitals/Pain Stable throughout    ADL  Eating/Feeding: Simulated;Independent Where Assessed - Eating/Feeding: Chair Grooming: Simulated;Wash/dry face;Set up;Supervision/safety Where Assessed - Grooming: Standing at sink Upper Body Bathing: Simulated;Chest;Right arm;Left arm;Abdomen;Set up Where Assessed - Upper Body Bathing: Sitting, bed Lower Body Bathing: Simulated;Set up;Supervision/safety Where Assessed - Lower Body Bathing: Sitting, bed Upper Body Dressing: Performed;Set up Where Assessed - Upper Body Dressing: Sitting, bed Lower Body Dressing: Simulated;Set up Where Assessed - Lower Body Dressing: Sitting, chair Toilet Transfer: Simulated;Supervision/safety Toilet Transfer Method: Ambulating Toilet Transfer Equipment: Other (comment) (straight back chair) Toileting - Clothing Manipulation: Simulated;Supervision/safety;Set up Where Assessed - Toileting Clothing Manipulation: Sit on 3-in-1 or  toilet Toileting - Hygiene: Simulated;Set up;Supervision/safety Where Assessed - Toileting Hygiene: Sit on 3-in-1 or toilet Equipment Used: Rolling walker;Reacher Ambulation Related to ADLs: Pt. supervision with RW ~100 ADL Comments: Pt. able to recall 3/3 back precautions and educated on techniques for completing ADLs with following precautions. Pt. demonstrated ability to cross foot over opposite knee to reach LE for LB ADLs. Pt. provided with demonstration for tub transfer with step over and use of tub seat; however pt. plans to have wife assist with sponge baths until able to shower or will use walk in shower at mother's house next door.         Visit Information  Last OT Received On: 03/23/12 Assistance Needed: +1    Subjective Data  Subjective: "I have had 6 back surgeries" Patient Stated Goal: Go home   Prior Functioning  Home Living Lives With: Family Available Help at Discharge: Family Type of Home: House Home Access: Stairs to enter [employer] of Steps: 1 Entrance Stairs-Rails: None Home Layout: Two level;Able to live on main level with bedroom/bathroom Alternate Level Stairs-Number of Steps: 15 Alternate Level Stairs-Rails: Left Bathroom Shower/Tub: Tub/shower unit;Curtain Bathroom Toilet: Handicapped height Bathroom Accessibility: Yes How Accessible: Accessible via walker Home Adaptive Equipment: Straight cane;Walker - rolling;Hand-held shower hose (can borrow tub seat from mom next door) Prior Function Level of Independence: Independent Able to Take Stairs?: Reciprically Driving: Yes Vocation: On disability Communication Communication: No difficulties Dominant Hand: Right    Cognition  Overall Cognitive Status: Appears within functional limits for tasks assessed/performed Arousal/Alertness: Awake/alert Orientation Level: Oriented X4 / Intact Behavior During Session: [facility] for tasks performed    Extremity/Trunk Assessment Left Lower Extremity  Assessment LLE Sensation: Deficits LLE Sensation Deficits: Pt reports that his LLE feels asleep, but it was like that previous to surgery and has been improving post-op.   Mobility Bed Mobility Bed Mobility: Not assessed Rolling Left: 5: Supervision;With rail Left  Sidelying to Sit: 5: Supervision Sitting - Scoot to Edge of Bed: 6: Modified independent (Device/Increase time) Details for Bed Mobility Assistance: Pt required supervision for safety with VC for sequencing to maintain back precautions. Transfers Transfers: Sit to Stand Sit to Stand: 5: Supervision;With upper extremity assist;From chair/3-in-1 Stand to Sit: 4: Min guard Details for Transfer Assistance: Pt required min guard assist for safety with VC for hand placement with RW.       Balance Balance Balance Assessed: Yes Static Sitting Balance Static Sitting - Balance Support: Bilateral upper extremity supported;Feet supported Static Sitting - Level of Assistance: 7: Independent Static Sitting - Comment/# of Minutes: 5 minutes  End of Session OT - End of Session Equipment Utilized During Treatment: Back brace Activity Tolerance: Patient tolerated treatment well Patient left: in chair;with call bell/phone within reach;with family/visitor present Nurse Communication: Mobility status;Precautions;Other (comment) (nurse tech educated on brace use)   Aune Adami, OTR/L Pager [phone] 03/23/2012, 1:28 PM

## 2012-03-23 NOTE — Progress Notes (Signed)
Postop day 1. Patient doing well. No lower extremity pain. Back pain well controlled.  Afebrile with stable vitals. Drain output moderate. Motor and sensory exam intact. Chest and abdomen benign.  Doing well, mobilize with therapy.

## 2012-03-23 NOTE — Evaluation (Signed)
Physical Therapy Evaluation Patient Details Name: Casey [name] MRN: [id number] DOB: 1956-06-01 Today's Date: 03/23/2012 Time: 5784-6962 PT Time Calculation (min): 28 min  PT Assessment / Plan / Recommendation Clinical Impression  Pt is a 56 y.o. male admitted s/p an L2-L3 decompressive laminectomy withresulting pain, decreased mobility, impaired gait, and decreased knowledge of precautions.  Pt will benefit from skilled physical therapy in the acute setting to address these impairments and prepare for d/c to home.    PT Assessment  Patient needs continued PT services    Follow Up Recommendations  No PT follow up;Supervision - Intermittent    Equipment Recommendations  None recommended by PT    Frequency Min 5X/week    Precautions / Restrictions Precautions Precautions: Back Precaution Booklet Issued: Yes (comment) Precaution Comments: Reviewed 3/3 back precautions with patietn and wife. Required Braces or Orthoses: Spinal Brace Spinal Brace: Lumbar corset;Applied in sitting position Restrictions Weight Bearing Restrictions: No   Pertinent Vitals/Pain 5/10 back pain      Mobility  Bed Mobility Bed Mobility: Rolling Left;Left Sidelying to Sit;Sitting - Scoot to Edge of Bed Rolling Left: 5: Supervision;With rail Left Sidelying to Sit: 5: Supervision Sitting - Scoot to Edge of Bed: 6: Modified independent (Device/Increase time) Details for Bed Mobility Assistance: Pt required supervision for safety with VC for sequencing to maintain back precautions. Transfers Transfers: Sit to Stand;Stand to Sit Sit to Stand: 4: Min guard Stand to Sit: 4: Min guard Details for Transfer Assistance: Pt required min guard assist for safety with VC for hand placement with RW. Ambulation/Gait Ambulation/Gait Assistance: 4: Min guard Ambulation Distance (Feet): 400 Feet Assistive device: Rolling walker Ambulation/Gait Assistance Details: Pt required min guard assist for safety. Gait  Pattern: Step-through pattern;Decreased stride length Stairs: No    Exercises     PT Goals Acute Rehab PT Goals PT Goal Formulation: With patient/family Time For Goal Achievement: 03/30/12 Potential to Achieve Goals: Good Pt will Roll Supine to Right Side: Independently;with rail PT Goal: Rolling Supine to Right Side - Progress: Goal set today Pt will Roll Supine to Left Side: Independently;with rail PT Goal: Rolling Supine to Left Side - Progress: Goal set today Pt will go Supine/Side to Sit: Independently;with HOB 0 degrees PT Goal: Supine/Side to Sit - Progress: Goal set today Pt will go Sit to Supine/Side: Independently PT Goal: Sit to Supine/Side - Progress: Goal set today Pt will go Sit to Stand: Independently PT Goal: Sit to Stand - Progress: Goal set today Pt will go Stand to Sit: Independently PT Goal: Stand to Sit - Progress: Goal set today Pt will Ambulate: >150 feet;with modified independence;with least restrictive assistive device PT Goal: Ambulate - Progress: Goal set today Pt will Go Up / Down Stairs: 1-2 stairs;with supervision PT Goal: Up/Down Stairs - Progress: Goal set today Additional Goals Additional Goal #1: Pt will be able to describe and maintain 3/3 back precautions throughout treatment. PT Goal: Additional Goal #1 - Progress: Goal set today  Visit Information  Last PT Received On: 03/23/12 Assistance Needed: +1    Subjective Data  Subjective: 5/10 back pain. Patient Stated Goal: To walk, to get into the pool.   Prior Functioning  Home Living Lives With: Family Available Help at Discharge: Family Type of Home: House Home Access: Stairs to enter [employer] of Steps: 1 Entrance Stairs-Rails: None Home Layout: Two level;Able to live on main level with bedroom/bathroom Alternate Level Stairs-Number of Steps: 15 Alternate Level Stairs-Rails: Left Bathroom Shower/Tub: Tub/shower unit;Curtain Bathroom  Toilet: Handicapped height Bathroom  Accessibility: Yes How Accessible: Accessible via walker Home Adaptive Equipment: Straight cane;Walker - rolling;Hand-held shower hose Prior Function Level of Independence: Independent Able to Take Stairs?: Reciprically Driving: Yes Vocation: On disability Communication Communication: No difficulties Dominant Hand: Right    Cognition  Overall Cognitive Status: Appears within functional limits for tasks assessed/performed Arousal/Alertness: Awake/alert Orientation Level: Oriented X4 / Intact Behavior During Session: [facility] for tasks performed    Extremity/Trunk Assessment Left Lower Extremity Assessment LLE Sensation: Deficits LLE Sensation Deficits: Pt reports that his LLE feels asleep, but it was like that previous to surgery and has been improving post-op.   Balance Balance Balance Assessed: Yes Static Sitting Balance Static Sitting - Balance Support: Bilateral upper extremity supported;Feet supported Static Sitting - Level of Assistance: 7: Independent Static Sitting - Comment/# of Minutes: 5 minutes  End of Session PT - End of Session Equipment Utilized During Treatment: Gait belt;Back brace Activity Tolerance: Patient tolerated treatment well Patient left: in chair;with call bell/phone within reach;with family/visitor present (Call bell/phone in reach of wife at patient's request) Nurse Communication: Mobility status   Ezzard Standing SPT 03/23/2012, 11:08 AM

## 2012-03-23 NOTE — Progress Notes (Signed)
Inpatient Diabetes Program Recommendations  AACE/ADA: New Consensus Statement on Inpatient Glycemic Control (2009)  Target Ranges:  Prepandial:   less than 140 mg/dL      Peak postprandial:   less than 180 mg/dL (1-2 hours)      Critically ill patients:  140 - 180 mg/dL   Reason for Visit: Hyperglycemia  Results for KHADEEM, ROCKETT (MRN [id number]) as of 03/23/2012 12:29  Ref. Range 03/22/2012 06:34 03/22/2012 11:50 03/22/2012 16:16 03/22/2012 22:45 03/23/2012 07:05 03/23/2012 11:30  Glucose-Capillary Latest Range: 70-99 mg/dL 409 (H) 811 (H) 914 (H) 193 (H) 161 (H) 145 (H)    Inpatient Diabetes Program Recommendations Correction (SSI): Add Novolog moderate tidwc and hs HgbA1C: Check HgbA1C to assess glycemic control  Note: Will follow.

## 2012-03-23 NOTE — Progress Notes (Signed)
UR COMPLETED  

## 2012-03-23 NOTE — Evaluation (Signed)
Agree with student PT evaluation.  Demetric Parslow, PT DPT [phone]  

## 2012-03-24 LAB — GLUCOSE, CAPILLARY
Glucose-Capillary: 132 mg/dL — ABNORMAL HIGH (ref 70–99)
Glucose-Capillary: 183 mg/dL — ABNORMAL HIGH (ref 70–99)

## 2012-03-24 MED ORDER — OXYCODONE-ACETAMINOPHEN 5-325 MG PO TABS: 1.0000 | ORAL_TABLET | ORAL | Status: AC | PRN

## 2012-03-24 MED ORDER — DIAZEPAM 5 MG PO TABS: 5.0000 mg | ORAL_TABLET | Freq: Four times a day (QID) | ORAL | Status: AC | PRN

## 2012-03-24 MED FILL — Heparin Sodium (Porcine) Inj 1000 Unit/ML: INTRAMUSCULAR | Qty: 30 | Status: AC

## 2012-03-24 MED FILL — Sodium Chloride IV Soln 0.9%: INTRAVENOUS | Qty: 1000 | Status: AC

## 2012-03-24 MED FILL — Sodium Chloride Irrigation Soln 0.9%: Qty: 3000 | Status: AC

## 2012-03-24 NOTE — Progress Notes (Signed)
Agree with student PT cancellation note.  Svea Pusch, PT DPT [phone]  

## 2012-03-24 NOTE — Progress Notes (Signed)
PT Cancellation Note  Treatment cancelled today due to patient's refusal to participate.  Pt sleeping.  Pt just finished walking up and down the hall with the daughter.  According to daughter, pt is being discharged today and was too tired to walk again.  Ezzard Standing SPT 03/24/2012, 11:00 AM

## 2012-03-24 NOTE — Discharge Summary (Signed)
Physician Discharge Summary  Patient ID: SHYLOH KRINKE MRN: [id number] DOB/AGE: [birth year] 56 y.o.  Admit date: 03/22/2012 Discharge date: 03/24/2012  Admission Diagnoses:  Discharge Diagnoses:  Principal Problem:  *Lumbar stenosis with neurogenic claudication   Discharged Condition: good  Hospital Course: Admitted to the hospital oriented where uncomplicated lumbar decompression and fusion. Postoperatively has done well. Preoperative back and lower extremity pain improved. Strength sensation are intact. Patient has mobilized well and is ready for discharge home. Consults:   Significant Diagnostic Studies:   Treatments:   Discharge Exam: Blood pressure 103/63, pulse 63, temperature 98.4 F (36.9 C), temperature source Oral, resp. rate 20, height 5\' 10"  (1.778 m), weight 110.4 kg (243 lb 6.2 oz), SpO2 97.00%. Patient awake and alert oriented appropriate trochanter function is intact. Motor and sensory function extremities is normal. Wound is clean dry and intact. Chest and abdomen are benign.  Disposition:    Medication List  As of 03/24/2012  8:59 AM   TAKE these medications         aspirin 81 MG EC tablet   Take 81 mg by mouth daily. Swallow whole.      diazepam 5 MG tablet   Commonly known as: VALIUM   Take 1-2 tablets (5-10 mg total) by mouth every 6 (six) hours as needed.      losartan 100 MG tablet   Commonly known as: COZAAR   Take 100 mg by mouth daily.      metFORMIN 1000 MG tablet   Commonly known as: GLUCOPHAGE   Take 500 mg by mouth 2 (two) times daily with a meal. Need to be cut: had lap band surgery      multivitamin tablet   Take 1 tablet by mouth daily.      oxyCODONE-acetaminophen 5-325 MG per tablet   Commonly known as: PERCOCET   Take 1 tablet by mouth every 4 (four) hours as needed. 1-2 tablets every 4 hours as needed for pain      oxyCODONE-acetaminophen 5-325 MG per tablet   Commonly known as: PERCOCET   Take 1-2 tablets by mouth every 4  (four) hours as needed.           Follow-up Information    Follow up with Jaz Laningham A, MD. Call in 1 week.   Contact information:   [address] [place] [address] [phone]          Signed: Temple Pacini 03/24/2012, 8:59 AM

## 2012-03-24 NOTE — Plan of Care (Signed)
Problem: Consults Goal: Diagnosis - Spinal Surgery Outcome: Completed/Met Date Met:  03/24/12 Microdiscectomy

## 2012-03-24 NOTE — Progress Notes (Signed)
Patient is d/c today with assessments being stable , vitals being stable too. Waiting on volunteer to take him down. D/c and medication instructions given and duly sign.

## 2012-05-28 DIAGNOSIS — R1310 Dysphagia, unspecified: Secondary | ICD-10-CM | POA: Diagnosis not present

## 2012-05-28 DIAGNOSIS — Z9884 Bariatric surgery status: Secondary | ICD-10-CM | POA: Diagnosis not present

## 2012-06-30 DIAGNOSIS — IMO0001 Reserved for inherently not codable concepts without codable children: Secondary | ICD-10-CM | POA: Diagnosis not present

## 2012-06-30 DIAGNOSIS — I10 Essential (primary) hypertension: Secondary | ICD-10-CM | POA: Diagnosis not present

## 2012-06-30 DIAGNOSIS — E782 Mixed hyperlipidemia: Secondary | ICD-10-CM | POA: Diagnosis not present

## 2012-07-22 DIAGNOSIS — M48061 Spinal stenosis, lumbar region without neurogenic claudication: Secondary | ICD-10-CM | POA: Diagnosis not present

## 2012-08-25 DIAGNOSIS — M48061 Spinal stenosis, lumbar region without neurogenic claudication: Secondary | ICD-10-CM | POA: Diagnosis not present

## 2012-09-29 DIAGNOSIS — Z9884 Bariatric surgery status: Secondary | ICD-10-CM | POA: Diagnosis not present

## 2012-09-29 DIAGNOSIS — T730XXA Starvation, initial encounter: Secondary | ICD-10-CM | POA: Diagnosis not present

## 2012-12-02 DIAGNOSIS — M48061 Spinal stenosis, lumbar region without neurogenic claudication: Secondary | ICD-10-CM | POA: Diagnosis not present

## 2013-01-19 DIAGNOSIS — E782 Mixed hyperlipidemia: Secondary | ICD-10-CM | POA: Diagnosis not present

## 2013-01-19 DIAGNOSIS — E1129 Type 2 diabetes mellitus with other diabetic kidney complication: Secondary | ICD-10-CM | POA: Diagnosis not present

## 2013-01-19 DIAGNOSIS — Z125 Encounter for screening for malignant neoplasm of prostate: Secondary | ICD-10-CM | POA: Diagnosis not present

## 2013-01-19 DIAGNOSIS — I10 Essential (primary) hypertension: Secondary | ICD-10-CM | POA: Diagnosis not present

## 2013-03-02 DIAGNOSIS — M48061 Spinal stenosis, lumbar region without neurogenic claudication: Secondary | ICD-10-CM | POA: Diagnosis not present

## 2013-03-16 DIAGNOSIS — H5210 Myopia, unspecified eye: Secondary | ICD-10-CM | POA: Diagnosis not present

## 2013-03-16 DIAGNOSIS — E119 Type 2 diabetes mellitus without complications: Secondary | ICD-10-CM | POA: Diagnosis not present

## 2013-03-16 DIAGNOSIS — H353190 Nonexudative age-related macular degeneration, unspecified eye, stage unspecified: Secondary | ICD-10-CM | POA: Diagnosis not present

## 2013-06-01 DIAGNOSIS — M48062 Spinal stenosis, lumbar region with neurogenic claudication: Secondary | ICD-10-CM | POA: Diagnosis not present

## 2013-08-04 DIAGNOSIS — M48062 Spinal stenosis, lumbar region with neurogenic claudication: Secondary | ICD-10-CM | POA: Diagnosis not present

## 2013-08-05 DIAGNOSIS — E782 Mixed hyperlipidemia: Secondary | ICD-10-CM | POA: Diagnosis not present

## 2013-08-05 DIAGNOSIS — R809 Proteinuria, unspecified: Secondary | ICD-10-CM | POA: Diagnosis not present

## 2013-08-05 DIAGNOSIS — E1129 Type 2 diabetes mellitus with other diabetic kidney complication: Secondary | ICD-10-CM | POA: Diagnosis not present

## 2013-08-05 DIAGNOSIS — Z23 Encounter for immunization: Secondary | ICD-10-CM | POA: Diagnosis not present

## 2013-08-05 DIAGNOSIS — I10 Essential (primary) hypertension: Secondary | ICD-10-CM | POA: Diagnosis not present

## 2013-11-03 DIAGNOSIS — M48062 Spinal stenosis, lumbar region with neurogenic claudication: Secondary | ICD-10-CM | POA: Diagnosis not present

## 2013-11-16 ENCOUNTER — Other Ambulatory Visit: Payer: Self-pay | Admitting: Neurosurgery

## 2013-11-16 DIAGNOSIS — M48061 Spinal stenosis, lumbar region without neurogenic claudication: Secondary | ICD-10-CM

## 2013-11-25 ENCOUNTER — Ambulatory Visit
Admission: RE | Admit: 2013-11-25 | Discharge: 2013-11-25 | Disposition: A | Discharge status: Home / Self Care | Payer: PRIVATE HEALTH INSURANCE | Source: Ambulatory Visit | Attending: Neurosurgery | Admitting: Neurosurgery

## 2013-11-25 VITALS — BP 144/86 | HR 58

## 2013-11-25 DIAGNOSIS — M48061 Spinal stenosis, lumbar region without neurogenic claudication: Secondary | ICD-10-CM | POA: Diagnosis not present

## 2013-11-25 DIAGNOSIS — M5126 Other intervertebral disc displacement, lumbar region: Secondary | ICD-10-CM | POA: Diagnosis not present

## 2013-11-25 DIAGNOSIS — M48062 Spinal stenosis, lumbar region with neurogenic claudication: Secondary | ICD-10-CM

## 2013-11-25 MED ORDER — IOHEXOL 180 MG/ML  SOLN
17.0000 mL | Freq: Once | INTRAMUSCULAR | Status: AC | PRN
  Administered 2013-11-25: 17 mL via INTRAVENOUS

## 2013-11-25 MED ORDER — DIAZEPAM 5 MG PO TABS
10.0000 mg | ORAL_TABLET | Freq: Once | ORAL | Status: AC
  Administered 2013-11-25: 10 mg via ORAL

## 2013-12-08 DIAGNOSIS — I10 Essential (primary) hypertension: Secondary | ICD-10-CM | POA: Diagnosis not present

## 2013-12-08 DIAGNOSIS — M48062 Spinal stenosis, lumbar region with neurogenic claudication: Secondary | ICD-10-CM | POA: Diagnosis not present

## 2013-12-08 DIAGNOSIS — E669 Obesity, unspecified: Secondary | ICD-10-CM | POA: Diagnosis not present

## 2014-03-08 DIAGNOSIS — M48062 Spinal stenosis, lumbar region with neurogenic claudication: Secondary | ICD-10-CM | POA: Diagnosis not present

## 2014-03-29 DIAGNOSIS — I10 Essential (primary) hypertension: Secondary | ICD-10-CM | POA: Diagnosis not present

## 2014-03-29 DIAGNOSIS — E782 Mixed hyperlipidemia: Secondary | ICD-10-CM | POA: Diagnosis not present

## 2014-03-29 DIAGNOSIS — E1129 Type 2 diabetes mellitus with other diabetic kidney complication: Secondary | ICD-10-CM | POA: Diagnosis not present

## 2014-03-29 DIAGNOSIS — R809 Proteinuria, unspecified: Secondary | ICD-10-CM | POA: Diagnosis not present

## 2014-05-08 DIAGNOSIS — H3530 Unspecified macular degeneration: Secondary | ICD-10-CM | POA: Diagnosis not present

## 2014-05-08 DIAGNOSIS — H2510 Age-related nuclear cataract, unspecified eye: Secondary | ICD-10-CM | POA: Diagnosis not present

## 2014-05-08 DIAGNOSIS — E119 Type 2 diabetes mellitus without complications: Secondary | ICD-10-CM | POA: Diagnosis not present

## 2014-05-08 DIAGNOSIS — H52209 Unspecified astigmatism, unspecified eye: Secondary | ICD-10-CM | POA: Diagnosis not present

## 2014-07-06 DIAGNOSIS — Z6841 Body Mass Index (BMI) 40.0 and over, adult: Secondary | ICD-10-CM | POA: Diagnosis not present

## 2014-07-06 DIAGNOSIS — I10 Essential (primary) hypertension: Secondary | ICD-10-CM | POA: Diagnosis not present

## 2014-07-06 DIAGNOSIS — M48062 Spinal stenosis, lumbar region with neurogenic claudication: Secondary | ICD-10-CM | POA: Diagnosis not present

## 2014-07-06 DIAGNOSIS — M47812 Spondylosis without myelopathy or radiculopathy, cervical region: Secondary | ICD-10-CM | POA: Diagnosis not present

## 2014-11-09 DIAGNOSIS — M4806 Spinal stenosis, lumbar region: Secondary | ICD-10-CM | POA: Diagnosis not present

## 2014-11-09 DIAGNOSIS — Z6841 Body Mass Index (BMI) 40.0 and over, adult: Secondary | ICD-10-CM | POA: Diagnosis not present

## 2014-11-09 DIAGNOSIS — I10 Essential (primary) hypertension: Secondary | ICD-10-CM | POA: Diagnosis not present

## 2014-12-07 DIAGNOSIS — Z6841 Body Mass Index (BMI) 40.0 and over, adult: Secondary | ICD-10-CM | POA: Diagnosis not present

## 2014-12-07 DIAGNOSIS — M4806 Spinal stenosis, lumbar region: Secondary | ICD-10-CM | POA: Diagnosis not present

## 2014-12-18 ENCOUNTER — Other Ambulatory Visit: Payer: Self-pay | Admitting: Neurosurgery

## 2014-12-18 DIAGNOSIS — M48061 Spinal stenosis, lumbar region without neurogenic claudication: Secondary | ICD-10-CM

## 2014-12-28 ENCOUNTER — Ambulatory Visit
Admission: RE | Admit: 2014-12-28 | Discharge: 2014-12-28 | Disposition: A | Discharge status: Home / Self Care | Payer: Medicare Other | Source: Ambulatory Visit | Attending: Neurosurgery | Admitting: Neurosurgery

## 2014-12-28 DIAGNOSIS — M5124 Other intervertebral disc displacement, thoracic region: Secondary | ICD-10-CM | POA: Diagnosis not present

## 2014-12-28 DIAGNOSIS — M4804 Spinal stenosis, thoracic region: Secondary | ICD-10-CM | POA: Diagnosis not present

## 2014-12-28 DIAGNOSIS — S3992XD Unspecified injury of lower back, subsequent encounter: Secondary | ICD-10-CM | POA: Diagnosis not present

## 2014-12-28 DIAGNOSIS — M48061 Spinal stenosis, lumbar region without neurogenic claudication: Secondary | ICD-10-CM

## 2014-12-28 DIAGNOSIS — M545 Low back pain: Secondary | ICD-10-CM | POA: Diagnosis not present

## 2014-12-28 MED ORDER — GADOBENATE DIMEGLUMINE 529 MG/ML IV SOLN
20.0000 mL | Freq: Once | INTRAVENOUS | Status: AC | PRN
  Administered 2014-12-28: 20 mL via INTRAVENOUS

## 2015-01-03 DIAGNOSIS — M5124 Other intervertebral disc displacement, thoracic region: Secondary | ICD-10-CM | POA: Diagnosis not present

## 2015-01-03 DIAGNOSIS — Z6841 Body Mass Index (BMI) 40.0 and over, adult: Secondary | ICD-10-CM | POA: Diagnosis not present

## 2015-01-19 DIAGNOSIS — M5104 Intervertebral disc disorders with myelopathy, thoracic region: Secondary | ICD-10-CM | POA: Diagnosis not present

## 2015-06-14 DIAGNOSIS — I10 Essential (primary) hypertension: Secondary | ICD-10-CM | POA: Diagnosis not present

## 2015-06-14 DIAGNOSIS — Z6841 Body Mass Index (BMI) 40.0 and over, adult: Secondary | ICD-10-CM | POA: Diagnosis not present

## 2015-06-14 DIAGNOSIS — M4806 Spinal stenosis, lumbar region: Secondary | ICD-10-CM | POA: Diagnosis not present

## 2015-07-06 DIAGNOSIS — I10 Essential (primary) hypertension: Secondary | ICD-10-CM | POA: Diagnosis not present

## 2015-07-06 DIAGNOSIS — E1121 Type 2 diabetes mellitus with diabetic nephropathy: Secondary | ICD-10-CM | POA: Diagnosis not present

## 2015-07-06 DIAGNOSIS — E782 Mixed hyperlipidemia: Secondary | ICD-10-CM | POA: Diagnosis not present

## 2015-07-06 DIAGNOSIS — E669 Obesity, unspecified: Secondary | ICD-10-CM | POA: Diagnosis not present

## 2015-07-06 DIAGNOSIS — B351 Tinea unguium: Secondary | ICD-10-CM | POA: Diagnosis not present

## 2015-07-06 DIAGNOSIS — K279 Peptic ulcer, site unspecified, unspecified as acute or chronic, without hemorrhage or perforation: Secondary | ICD-10-CM | POA: Diagnosis not present

## 2015-07-09 ENCOUNTER — Other Ambulatory Visit: Payer: Self-pay | Admitting: Neurosurgery

## 2015-07-09 DIAGNOSIS — M48061 Spinal stenosis, lumbar region without neurogenic claudication: Secondary | ICD-10-CM

## 2015-07-10 ENCOUNTER — Other Ambulatory Visit: Payer: Self-pay | Admitting: *Deleted

## 2015-07-11 ENCOUNTER — Ambulatory Visit
Admission: RE | Admit: 2015-07-11 | Discharge: 2015-07-11 | Disposition: A | Discharge status: Home / Self Care | Payer: BLUE CROSS/BLUE SHIELD | Source: Ambulatory Visit | Attending: Neurosurgery | Admitting: Neurosurgery

## 2015-07-11 DIAGNOSIS — M48061 Spinal stenosis, lumbar region without neurogenic claudication: Secondary | ICD-10-CM

## 2015-07-18 DIAGNOSIS — M4806 Spinal stenosis, lumbar region: Secondary | ICD-10-CM | POA: Diagnosis not present

## 2015-07-18 DIAGNOSIS — I10 Essential (primary) hypertension: Secondary | ICD-10-CM | POA: Diagnosis not present

## 2015-07-18 DIAGNOSIS — Z6841 Body Mass Index (BMI) 40.0 and over, adult: Secondary | ICD-10-CM | POA: Diagnosis not present

## 2015-09-05 DIAGNOSIS — E669 Obesity, unspecified: Secondary | ICD-10-CM | POA: Diagnosis not present

## 2015-09-05 DIAGNOSIS — E1121 Type 2 diabetes mellitus with diabetic nephropathy: Secondary | ICD-10-CM | POA: Diagnosis not present

## 2015-09-05 DIAGNOSIS — B351 Tinea unguium: Secondary | ICD-10-CM | POA: Diagnosis not present

## 2015-09-05 DIAGNOSIS — I10 Essential (primary) hypertension: Secondary | ICD-10-CM | POA: Diagnosis not present

## 2015-11-07 DIAGNOSIS — M4806 Spinal stenosis, lumbar region: Secondary | ICD-10-CM | POA: Diagnosis not present

## 2015-11-07 DIAGNOSIS — I10 Essential (primary) hypertension: Secondary | ICD-10-CM | POA: Diagnosis not present

## 2015-11-07 DIAGNOSIS — Z6841 Body Mass Index (BMI) 40.0 and over, adult: Secondary | ICD-10-CM | POA: Diagnosis not present

## 2015-12-05 DIAGNOSIS — E1121 Type 2 diabetes mellitus with diabetic nephropathy: Secondary | ICD-10-CM | POA: Diagnosis not present

## 2015-12-05 DIAGNOSIS — Z7984 Long term (current) use of oral hypoglycemic drugs: Secondary | ICD-10-CM | POA: Diagnosis not present

## 2015-12-05 DIAGNOSIS — I10 Essential (primary) hypertension: Secondary | ICD-10-CM | POA: Diagnosis not present

## 2015-12-05 DIAGNOSIS — E669 Obesity, unspecified: Secondary | ICD-10-CM | POA: Diagnosis not present

## 2015-12-05 DIAGNOSIS — Z6841 Body Mass Index (BMI) 40.0 and over, adult: Secondary | ICD-10-CM | POA: Diagnosis not present

## 2015-12-05 DIAGNOSIS — J069 Acute upper respiratory infection, unspecified: Secondary | ICD-10-CM | POA: Diagnosis not present

## 2016-02-06 DIAGNOSIS — M961 Postlaminectomy syndrome, not elsewhere classified: Secondary | ICD-10-CM | POA: Diagnosis not present

## 2016-03-13 DIAGNOSIS — M961 Postlaminectomy syndrome, not elsewhere classified: Secondary | ICD-10-CM | POA: Diagnosis not present

## 2016-03-13 DIAGNOSIS — M5124 Other intervertebral disc displacement, thoracic region: Secondary | ICD-10-CM | POA: Diagnosis not present

## 2016-03-13 DIAGNOSIS — M4806 Spinal stenosis, lumbar region: Secondary | ICD-10-CM | POA: Diagnosis not present

## 2016-03-13 DIAGNOSIS — Z6841 Body Mass Index (BMI) 40.0 and over, adult: Secondary | ICD-10-CM | POA: Diagnosis not present

## 2016-03-13 DIAGNOSIS — I10 Essential (primary) hypertension: Secondary | ICD-10-CM | POA: Diagnosis not present

## 2016-03-26 DIAGNOSIS — E669 Obesity, unspecified: Secondary | ICD-10-CM | POA: Diagnosis not present

## 2016-03-26 DIAGNOSIS — Z7984 Long term (current) use of oral hypoglycemic drugs: Secondary | ICD-10-CM | POA: Diagnosis not present

## 2016-03-26 DIAGNOSIS — Z6841 Body Mass Index (BMI) 40.0 and over, adult: Secondary | ICD-10-CM | POA: Diagnosis not present

## 2016-03-26 DIAGNOSIS — I10 Essential (primary) hypertension: Secondary | ICD-10-CM | POA: Diagnosis not present

## 2016-03-26 DIAGNOSIS — E1121 Type 2 diabetes mellitus with diabetic nephropathy: Secondary | ICD-10-CM | POA: Diagnosis not present

## 2016-03-26 DIAGNOSIS — R5383 Other fatigue: Secondary | ICD-10-CM | POA: Diagnosis not present

## 2016-04-07 DIAGNOSIS — Z4651 Encounter for fitting and adjustment of gastric lap band: Secondary | ICD-10-CM | POA: Diagnosis not present

## 2016-04-07 DIAGNOSIS — R635 Abnormal weight gain: Secondary | ICD-10-CM | POA: Diagnosis not present

## 2016-04-07 DIAGNOSIS — Z6841 Body Mass Index (BMI) 40.0 and over, adult: Secondary | ICD-10-CM | POA: Diagnosis not present

## 2016-04-09 DIAGNOSIS — E291 Testicular hypofunction: Secondary | ICD-10-CM | POA: Diagnosis not present

## 2016-04-30 DIAGNOSIS — Z713 Dietary counseling and surveillance: Secondary | ICD-10-CM | POA: Diagnosis not present

## 2016-04-30 DIAGNOSIS — E669 Obesity, unspecified: Secondary | ICD-10-CM | POA: Diagnosis not present

## 2016-04-30 DIAGNOSIS — Z6841 Body Mass Index (BMI) 40.0 and over, adult: Secondary | ICD-10-CM | POA: Diagnosis not present

## 2016-06-02 DIAGNOSIS — Z4651 Encounter for fitting and adjustment of gastric lap band: Secondary | ICD-10-CM | POA: Diagnosis not present

## 2016-06-02 DIAGNOSIS — Z6841 Body Mass Index (BMI) 40.0 and over, adult: Secondary | ICD-10-CM | POA: Diagnosis not present

## 2016-06-02 DIAGNOSIS — R1310 Dysphagia, unspecified: Secondary | ICD-10-CM | POA: Diagnosis not present

## 2016-06-04 DIAGNOSIS — M961 Postlaminectomy syndrome, not elsewhere classified: Secondary | ICD-10-CM | POA: Diagnosis not present

## 2016-06-04 DIAGNOSIS — H353111 Nonexudative age-related macular degeneration, right eye, early dry stage: Secondary | ICD-10-CM | POA: Diagnosis not present

## 2016-06-04 DIAGNOSIS — E119 Type 2 diabetes mellitus without complications: Secondary | ICD-10-CM | POA: Diagnosis not present

## 2016-06-04 DIAGNOSIS — Z5181 Encounter for therapeutic drug level monitoring: Secondary | ICD-10-CM | POA: Diagnosis not present

## 2016-06-04 DIAGNOSIS — M5124 Other intervertebral disc displacement, thoracic region: Secondary | ICD-10-CM | POA: Diagnosis not present

## 2016-06-04 DIAGNOSIS — H2513 Age-related nuclear cataract, bilateral: Secondary | ICD-10-CM | POA: Diagnosis not present

## 2016-06-04 DIAGNOSIS — H52203 Unspecified astigmatism, bilateral: Secondary | ICD-10-CM | POA: Diagnosis not present

## 2016-06-04 DIAGNOSIS — Z79899 Other long term (current) drug therapy: Secondary | ICD-10-CM | POA: Diagnosis not present

## 2016-06-04 DIAGNOSIS — M4806 Spinal stenosis, lumbar region: Secondary | ICD-10-CM | POA: Diagnosis not present

## 2016-06-04 DIAGNOSIS — Z79891 Long term (current) use of opiate analgesic: Secondary | ICD-10-CM | POA: Diagnosis not present

## 2016-07-16 DIAGNOSIS — I10 Essential (primary) hypertension: Secondary | ICD-10-CM | POA: Diagnosis not present

## 2016-07-16 DIAGNOSIS — E1121 Type 2 diabetes mellitus with diabetic nephropathy: Secondary | ICD-10-CM | POA: Diagnosis not present

## 2016-07-16 DIAGNOSIS — Z6839 Body mass index (BMI) 39.0-39.9, adult: Secondary | ICD-10-CM | POA: Diagnosis not present

## 2016-07-16 DIAGNOSIS — L237 Allergic contact dermatitis due to plants, except food: Secondary | ICD-10-CM | POA: Diagnosis not present

## 2016-07-16 DIAGNOSIS — E291 Testicular hypofunction: Secondary | ICD-10-CM | POA: Diagnosis not present

## 2016-07-16 DIAGNOSIS — E669 Obesity, unspecified: Secondary | ICD-10-CM | POA: Diagnosis not present

## 2016-07-16 DIAGNOSIS — Z7984 Long term (current) use of oral hypoglycemic drugs: Secondary | ICD-10-CM | POA: Diagnosis not present

## 2016-07-29 DIAGNOSIS — Z9884 Bariatric surgery status: Secondary | ICD-10-CM | POA: Diagnosis not present

## 2016-07-29 DIAGNOSIS — Z6841 Body Mass Index (BMI) 40.0 and over, adult: Secondary | ICD-10-CM | POA: Diagnosis not present

## 2016-08-21 DIAGNOSIS — J019 Acute sinusitis, unspecified: Secondary | ICD-10-CM | POA: Diagnosis not present

## 2016-08-21 DIAGNOSIS — E291 Testicular hypofunction: Secondary | ICD-10-CM | POA: Diagnosis not present

## 2016-09-02 DIAGNOSIS — I10 Essential (primary) hypertension: Secondary | ICD-10-CM | POA: Diagnosis not present

## 2016-09-02 DIAGNOSIS — Z6839 Body mass index (BMI) 39.0-39.9, adult: Secondary | ICD-10-CM | POA: Diagnosis not present

## 2016-09-02 DIAGNOSIS — M961 Postlaminectomy syndrome, not elsewhere classified: Secondary | ICD-10-CM | POA: Diagnosis not present

## 2016-09-02 DIAGNOSIS — M5124 Other intervertebral disc displacement, thoracic region: Secondary | ICD-10-CM | POA: Diagnosis not present

## 2016-10-21 DIAGNOSIS — Z9884 Bariatric surgery status: Secondary | ICD-10-CM | POA: Diagnosis not present

## 2016-11-12 DIAGNOSIS — M961 Postlaminectomy syndrome, not elsewhere classified: Secondary | ICD-10-CM | POA: Diagnosis not present

## 2016-11-12 DIAGNOSIS — I10 Essential (primary) hypertension: Secondary | ICD-10-CM | POA: Diagnosis not present

## 2016-11-12 DIAGNOSIS — M5124 Other intervertebral disc displacement, thoracic region: Secondary | ICD-10-CM | POA: Diagnosis not present

## 2016-11-12 DIAGNOSIS — Z6839 Body mass index (BMI) 39.0-39.9, adult: Secondary | ICD-10-CM | POA: Diagnosis not present

## 2016-11-20 DIAGNOSIS — E669 Obesity, unspecified: Secondary | ICD-10-CM | POA: Diagnosis not present

## 2016-11-20 DIAGNOSIS — Z6841 Body Mass Index (BMI) 40.0 and over, adult: Secondary | ICD-10-CM | POA: Diagnosis not present

## 2017-01-08 DIAGNOSIS — I10 Essential (primary) hypertension: Secondary | ICD-10-CM | POA: Diagnosis not present

## 2017-01-08 DIAGNOSIS — Z7984 Long term (current) use of oral hypoglycemic drugs: Secondary | ICD-10-CM | POA: Diagnosis not present

## 2017-01-08 DIAGNOSIS — Z6841 Body Mass Index (BMI) 40.0 and over, adult: Secondary | ICD-10-CM | POA: Diagnosis not present

## 2017-01-08 DIAGNOSIS — E291 Testicular hypofunction: Secondary | ICD-10-CM | POA: Diagnosis not present

## 2017-01-08 DIAGNOSIS — E1121 Type 2 diabetes mellitus with diabetic nephropathy: Secondary | ICD-10-CM | POA: Diagnosis not present

## 2017-01-08 DIAGNOSIS — E669 Obesity, unspecified: Secondary | ICD-10-CM | POA: Diagnosis not present

## 2017-01-20 ENCOUNTER — Ambulatory Visit (INDEPENDENT_AMBULATORY_CARE_PROVIDER_SITE_OTHER): Payer: PRIVATE HEALTH INSURANCE | Admitting: Endocrinology

## 2017-01-20 ENCOUNTER — Encounter: Payer: Self-pay | Admitting: Endocrinology

## 2017-01-20 VITALS — BP 132/84 | HR 67 | Ht 69.0 in | Wt 278.0 lb

## 2017-01-20 DIAGNOSIS — E291 Testicular hypofunction: Secondary | ICD-10-CM

## 2017-01-20 DIAGNOSIS — R5383 Other fatigue: Secondary | ICD-10-CM

## 2017-01-20 DIAGNOSIS — E1165 Type 2 diabetes mellitus with hyperglycemia: Secondary | ICD-10-CM

## 2017-01-20 LAB — T4, FREE: FREE T4: 0.78 ng/dL (ref 0.60–1.60)

## 2017-01-20 LAB — LUTEINIZING HORMONE: LH: 0.17 m[IU]/mL — AB (ref 1.50–9.30)

## 2017-01-20 NOTE — Progress Notes (Signed)
Patient ID: Casey [name], male   DOB: 1956/11/10, 61 y.o.   MRN: [id number]            Referring physician: Olen Pel  Reason for consultation: Low testosterone   Chief complaint:   History of Present Illness  Hypogonadismwas diagnosed in 03/2016  He  had complaints offatigue, decreased motivation and lack of energy when he was first evaluated The symptoms started 2-3 years ago Also although he does have some decreased libido he has more issues with erectile dysfunction for some time    There is no history of the following: Hot flushes, sweats, breast enlargement, long term anabolic steroid use, history of testicular injury/ mumps in childhood. No history of osteopenia or low impact fracture  Prior lab results showtestosterone levels of:  Baseline testosterone level 261, normal >300 Prolactin level was normal at 4.1 However LH levels not done.  Initially the patient was prescribed Androderm but this was not approved and eventually he was able to get AndroGel prior authorized He does not think his energy level improved much with AndroGel and his testosterone levels continue to be in the 200+ range  A few months ago he was switched from the gel to the testosterone injections, this is probably prior to 9/17 but detailed records are not available  With the testosterone injections he says he feels increased energy for the first few days but by the end of the second week he starts feeling markedly fatigued again.  Initially was getting injections monthly but since his level was as low as 143 the dose was increased to twice a month and from 100 up to 200 mg intermuscular  Apparently his last testosterone level done at his PCP office in 2/18 was 248, this was probably just before his injection was due He is now being referred for further management   No results found for: TESTOSTERONE  Prolactin level: 4.1  No results found for: LH     Testoserone supplements  that hehas used include     Allergies as of 01/20/2017      Reactions   Penicillins Hives   When he was 9      Medication List       Accurate as of 01/20/17 11:25 AM. Always use your most recent med list.          aspirin 81 MG EC tablet Take 81 mg by mouth daily. Swallow whole.   losartan 100 MG tablet Commonly known as:  COZAAR Take 100 mg by mouth daily.   metFORMIN 1000 MG tablet Commonly known as:  GLUCOPHAGE Take 500 mg by mouth 2 (two) times daily with a meal. Need to be cut: had lap band surgery   multivitamin tablet Take 1 tablet by mouth daily.   oxyCODONE-acetaminophen 5-325 MG tablet Commonly known as:  PERCOCET/ROXICET Take 1 tablet by mouth every 4 (four) hours as needed. 1-2 tablets every 4 hours as needed for pain       Allergies:  Allergies  Allergen Reactions  . Penicillins Hives    When he was 9    Past Medical History:  Diagnosis Date  . Arthritis   . Complication of anesthesia    diff. waking up  . Diabetes mellitus   . Headache(784.0)   . Hypertension    dr Marisue Humble   pcp    Past Surgical History:  Procedure Laterality Date  . BACK SURGERY     5  back surgeries  . CARPAL TUNNEL RELEASE  bil  . COLON SURGERY     2004 for colon rupture  . HERNIA REPAIR    . LAPAROSCOPIC GASTRIC BANDING    . MANDIBLE FRACTURE SURGERY     x 2  . TONSILLECTOMY      No family history on file.  Social History:  reports that he quit smoking about 11 years ago. His smoking use included Cigarettes. He has a 13.50 pack-year smoking history. He has never used smokeless tobacco. He reports that he does not drink alcohol or use drugs.  Review of Systems  Constitutional: Positive for weight gain.       Maximum weight previously 369, and initially lost weight with gastric band surgery in 2012 but now losing weight again with starting to have his band adjusted   HENT:       He has had periodic tension headaches, no new headaches  Eyes: Negative  for blurred vision.  Respiratory: Negative for shortness of breath.   Cardiovascular: Negative for leg swelling.  Gastrointestinal: Positive for vomiting.       He is eating small portions with his gastric band and occasionally may have vomiting  Endocrine: Positive for fatigue, decreased libido and erectile dysfunction. Negative for polydipsia.  Genitourinary: Negative for frequency.  Musculoskeletal: Negative for joint pain.  Skin: Negative for rash.  Neurological: Negative for numbness and tingling.  Psychiatric/Behavioral: Negative for insomnia.   DIABETES: He has had this for about 14 years About 3 years ago his weight loss physician started him on Victoza to help with weight loss  Last A1c on record is only in 8/17, this was 6.7 He is currently also on metformin, Actos and Amaryl, no recent symptoms of hypoglycemia   General Examination:   BP 132/84   Pulse 67   Ht 5\' 9"  (1.753 m)   Wt 278 lb (126.1 kg)   SpO2 96%   BMI 41.05 kg/m   GENERAL APPEARANCEHe has marked generalized obesity.  SKIN:normal, no rash or pigmentation.  HEENT:Oral mucosa normal.  Fundus exam does not show up is retinopathy EYES:normal external appearance of eyes  NECK:no lymphadenopathy, no thyromegaly.   CHEST: Gynecomastia present bilaterally, firm tissue about 2-2.5 cm bilaterally present LUNGS:clear to auscultation bilaterally, no wheezes, rhonchi, rales.   HEART:normal S1 And S2, no S3, S4, murmur or click.  ABDOMEN:no hepatosplenomegaly, no masses palpated, soft and not tender.   MALE GENITOURINARY:Both testicles are about 2.5-4 cm, firm  MUSCULOSKELETALNo enlargement or deformity of joints.  EXTREMITIES:no clubbing, no edema.  NEUROLOGIC EXAM: Biceps reflexes and ankle reflexes are difficult to elicit  Assessment/ Plan:  Hypogonadism, likely this is secondary to insulin resistance syndrome related to his marked obesity and  history of diabetes He has had mildly decreased total testosterone levels but free testosterone levels not available Also Baseline LH level not available  Currently he is on testosterone injections and likely because of his marked obesity he is not getting therapeutic levels or subjective benefit with taking 200 mg every 2 weeks, his last trough level was 248  Advised the patient that we should at least do basic evaluation of his low testosterone with free testosterone level and also LH level even though he is already on treatment Prolactin to be rechecked Also will check free T4 level to rule out secondary hypothyroidism  Discussed various options for testosterone supplementation including transdermal gel at higher doses and clomiphene. 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 he should respond to clomiphene Most likely he did not have adequate  dosage of AndroGel and discussed that this is preferable to testosterone injections because of more consistent daily levels of testosterone  Treatment plan will be finalized once his labs are available  DIABETES: He is being managed by his PCP and Victoza has been started by his weight management program with last A1c improved at 6.7 However needs follow-up A1c  Advised him to continue Victoza but consider stopping Actos, not clear if this has helped him previously   ERECTILE dysfunction: This is likely organic related to his diabetes and he needs to discuss using pharmacological treatment for this with his PCP  OBESITY: He is being followed by weight management program and may be a candidate for gastric sleeve surgery in the future  Total visit time for evaluation and management of multiple problems = 60 minutes  Enes Rokosz 01/20/2017, 11:25 AM

## 2017-01-21 LAB — TESTOSTERONE, FREE, TOTAL, SHBG
Sex Hormone Binding: 21.7 nmol/L (ref 19.3–76.4)
TESTOSTERONE FREE: 14.4 pg/mL (ref 7.2–24.0)
TESTOSTERONE: 607 ng/dL (ref 264–916)

## 2017-01-21 LAB — PROLACTIN: Prolactin: 7.1 ng/mL (ref 4.0–15.2)

## 2017-01-21 NOTE — Progress Notes (Signed)
Please let patient know that the testosterone level is high at 607, pituitary hormone test is suppressed. Recommend we stop injection and start  a program of clomiphene 50 mg, half tablet 3 times a week along with AndroGel 1.62%, one pump on one arm and 2 on the other Send prescriptions Follow-up in 6-8 weeks as scheduled

## 2017-01-23 ENCOUNTER — Other Ambulatory Visit: Payer: Self-pay | Admitting: Endocrinology

## 2017-01-23 NOTE — Telephone Encounter (Signed)
Patient is calling for the result of his labs. °

## 2017-01-26 ENCOUNTER — Other Ambulatory Visit: Payer: Self-pay

## 2017-01-26 MED ORDER — TESTOSTERONE 20.25 MG/ACT (1.62%) TD GEL: TRANSDERMAL | 2 refills | Status: DC

## 2017-01-26 MED ORDER — CLOMIPHENE CITRATE 50 MG PO TABS: ORAL_TABLET | ORAL | 3 refills | Status: DC

## 2017-01-29 MED ORDER — TESTOSTERONE 50 MG/5GM (1%) TD GEL: TRANSDERMAL | 1 refills | Status: DC

## 2017-01-29 NOTE — Telephone Encounter (Signed)
Just wanted to verify, should the directions for the testim be 1/2 to 1 tube daily?

## 2017-01-29 NOTE — Telephone Encounter (Signed)
AndroGel 1.62% to the Testim 5 g, 1-1/2 tubes daily

## 2017-01-29 NOTE — Telephone Encounter (Signed)
See message and please advise if ok to send? 

## 2017-01-29 NOTE — Telephone Encounter (Signed)
Pharmacist at [facility] calling to let us know what the alternate to the testosterone is  Insurance will cover the generic testim 1% gel

## 2017-01-29 NOTE — Telephone Encounter (Signed)
Correction: 1 tube on one arm and a half tube on the other daily

## 2017-01-29 NOTE — Telephone Encounter (Signed)
Lattie Haw, I have printed this and placed it on Dr. Ronnie Derby desk. Please fax once he signs the prescription.  Thanks!

## 2017-02-05 DIAGNOSIS — M5124 Other intervertebral disc displacement, thoracic region: Secondary | ICD-10-CM | POA: Diagnosis not present

## 2017-02-05 DIAGNOSIS — M961 Postlaminectomy syndrome, not elsewhere classified: Secondary | ICD-10-CM | POA: Diagnosis not present

## 2017-02-05 DIAGNOSIS — I10 Essential (primary) hypertension: Secondary | ICD-10-CM | POA: Diagnosis not present

## 2017-02-05 DIAGNOSIS — Z6838 Body mass index (BMI) 38.0-38.9, adult: Secondary | ICD-10-CM | POA: Diagnosis not present

## 2017-02-18 DIAGNOSIS — Z6841 Body Mass Index (BMI) 40.0 and over, adult: Secondary | ICD-10-CM | POA: Diagnosis not present

## 2017-02-18 DIAGNOSIS — Z9884 Bariatric surgery status: Secondary | ICD-10-CM | POA: Diagnosis not present

## 2017-03-03 ENCOUNTER — Other Ambulatory Visit: Payer: Self-pay | Admitting: Endocrinology

## 2017-03-10 DIAGNOSIS — M25511 Pain in right shoulder: Secondary | ICD-10-CM | POA: Diagnosis not present

## 2017-03-18 ENCOUNTER — Ambulatory Visit: Payer: PRIVATE HEALTH INSURANCE | Admitting: Endocrinology

## 2017-03-24 DIAGNOSIS — Z9884 Bariatric surgery status: Secondary | ICD-10-CM | POA: Diagnosis not present

## 2017-04-07 ENCOUNTER — Ambulatory Visit: Payer: PRIVATE HEALTH INSURANCE | Admitting: Endocrinology

## 2017-04-25 ENCOUNTER — Other Ambulatory Visit: Payer: Self-pay | Admitting: Endocrinology

## 2017-04-29 ENCOUNTER — Ambulatory Visit (INDEPENDENT_AMBULATORY_CARE_PROVIDER_SITE_OTHER): Payer: PRIVATE HEALTH INSURANCE | Admitting: Endocrinology

## 2017-04-29 ENCOUNTER — Encounter: Payer: Self-pay | Admitting: Endocrinology

## 2017-04-29 VITALS — BP 162/108 | HR 76 | Ht 69.0 in | Wt 273.4 lb

## 2017-04-29 DIAGNOSIS — E1169 Type 2 diabetes mellitus with other specified complication: Secondary | ICD-10-CM | POA: Diagnosis not present

## 2017-04-29 DIAGNOSIS — E669 Obesity, unspecified: Secondary | ICD-10-CM | POA: Diagnosis not present

## 2017-04-29 DIAGNOSIS — Z125 Encounter for screening for malignant neoplasm of prostate: Secondary | ICD-10-CM | POA: Diagnosis not present

## 2017-04-29 DIAGNOSIS — E291 Testicular hypofunction: Secondary | ICD-10-CM | POA: Diagnosis not present

## 2017-04-29 LAB — TESTOSTERONE: Testosterone: 302.63 ng/dL (ref 300.00–890.00)

## 2017-04-29 LAB — GLUCOSE, RANDOM: GLUCOSE: 150 mg/dL — AB (ref 70–99)

## 2017-04-29 LAB — HEMOGLOBIN A1C: Hgb A1c MFr Bld: 7.5 % — ABNORMAL HIGH (ref 4.6–6.5)

## 2017-04-29 LAB — PSA, MEDICARE: PSA: 0.84 ng/mL (ref 0.10–4.00)

## 2017-04-29 LAB — LUTEINIZING HORMONE: LH: 10.01 m[IU]/mL — ABNORMAL HIGH (ref 1.50–9.30)

## 2017-04-29 NOTE — Progress Notes (Signed)
Patient ID: Casey [name], male   DOB: Jan 14, 1956, 61 y.o.   MRN: [id number]            Referring physician: Olen Pel  Reason for consultation: Low testosterone   Chief complaint: Follow-up  History of Present Illness  Hypogonadismwas diagnosed in 03/2016  He  had complaints offatigue, decreased motivation and lack of energy when he was first evaluated The symptoms started 2-3 years ago Also although he does have some decreased libido he has more issues with erectile dysfunction for some time  Prior lab results showtestosterone levels of: Baseline testosterone level 261, normal >300 Prolactin level was normal at 4.1 However LH levels not done.  Initially the patient was prescribed Androderm but this was not approved and eventually he was able to get AndroGel prior authorized He does not think his energy level improved much with AndroGel and his testosterone levels continue to be in the 200+ range His PCP switched him from the gel to the testosterone injections, this is probably prior to 9/17 but detailed records are not available With the testosterone injections he says he feels increased energy for the first few days but by the end of the second week he starts feeling markedly fatigued again.  Initially was getting injections monthly but since his level was as low as 143 the dose was increased to twice a month and from 100 up to 200 mg intermuscular  RECENT history: On his initial evaluation his testosterone level was relatively high with because of his inconsistent subjective improvement with injections With switching to clomiphene and AndroGel after his last visit he started feeling better and was having more energy on daily basis instead of on and off Higher in the last few weeks he is again feeling somewhat more sluggish He is having a lot of stress with dealing with his mom's terminal illness Labs pending  Lab Results  Component Value Date   TESTOSTERONE 607 01/20/2017      Prolactin level: 4.1  Lab Results  Component Value Date   LH 0.17 (L) 01/20/2017       Testoserone supplements that hehas used include androgel1%, 1-1/2 tubes daily He is applying this and allowing it to dry in the morning although he is complaining that it is sticky    Allergies as of 04/29/2017      Reactions   Penicillins Hives   When he was 9      Medication List       Accurate as of 04/29/17 10:50 AM. Always use your most recent med list.          aspirin 81 MG EC tablet Take 81 mg by mouth daily. Swallow whole.   atenolol 100 MG tablet Commonly known as:  TENORMIN Take 100 mg by mouth daily.   clomiPHENE 50 MG tablet Commonly known as:  CLOMID TAKE ONE-HALF TABLET BY MOUTH THREE times PER WEEK   glimepiride 2 MG tablet Commonly known as:  AMARYL Take 1 Tablet by mouth once daily for diabetes   HYDROcodone-acetaminophen 10-325 MG tablet Commonly known as:  NORCO TK 1 T PO Q 4 H PRN P DO NOT EXCEED 6 TS PER DAY   losartan 100 MG tablet Commonly known as:  COZAAR Take 100 mg by mouth daily.   losartan 50 MG tablet Commonly known as:  COZAAR Take 50 mg by mouth daily.   metFORMIN 1000 MG tablet Commonly known as:  GLUCOPHAGE Take 500 mg by mouth 2 (two) times daily with  a meal. Need to be cut: had lap band surgery   multivitamin tablet Take 1 tablet by mouth daily.   oxyCODONE-acetaminophen 5-325 MG tablet Commonly known as:  PERCOCET/ROXICET Take 1 tablet by mouth every 4 (four) hours as needed. 1-2 tablets every 4 hours as needed for pain   pioglitazone 30 MG tablet Commonly known as:  ACTOS Take 30 mg by mouth daily.   testosterone 50 MG/5GM (1%) Gel Commonly known as:  ANDROGEL Apply contents of 1 tube (5 grams) on 1 arm and 1/2 tube (2.5 grams) on the other arm once daily, as directed   VICTOZA 18 MG/3ML Sopn Generic drug:  liraglutide Inject 0.6 mg into the skin.       Allergies:  Allergies  Allergen Reactions  .  Penicillins Hives    When he was 9    Past Medical History:  Diagnosis Date  . Arthritis   . Complication of anesthesia    diff. waking up  . Diabetes mellitus   . Headache(784.0)   . Hypertension    dr Marisue Humble   pcp    Past Surgical History:  Procedure Laterality Date  . BACK SURGERY     5  back surgeries  . CARPAL TUNNEL RELEASE     bil  . COLON SURGERY     2004 for colon rupture  . HERNIA REPAIR    . LAPAROSCOPIC GASTRIC BANDING    . MANDIBLE FRACTURE SURGERY     x 2  . TONSILLECTOMY      Family History  Problem Relation Age of Onset  . Diabetes Maternal Grandfather   . Heart disease Neg Hx     Social History:  reports that he quit smoking about 11 years ago. His smoking use included Cigarettes. He has a 13.50 pack-year smoking history. He has never used smokeless tobacco. He reports that he does not drink alcohol or use drugs.  Review of Systems    DIABETES: He has had this for about 14 years About 3 years ago his weight loss physician started him on Victoza to help with weight loss  Last A1c on record is 6.6 in 2/18 He had a steroid injection about a month ago which raised his blood sugar up to over 200 for a couple of weeks, recently blood sugar has been back in the 70s and 80s, checking only in the morning   Wt Readings from Last 3 Encounters:  04/29/17 273 lb 6.4 oz (124 kg)  01/20/17 278 lb (126.1 kg)  03/22/12 243 lb 6.2 oz (110.4 kg)     General Examination:   BP (!) 162/108   Pulse 76   Ht 5\' 9"  (1.753 m)   Wt 273 lb 6.4 oz (124 kg)   SpO2 96%   BMI 40.37 kg/m     Assessment/ Plan:  Hypogonadism, this is secondary to insulin resistance syndrome related to his marked obesity and history of diabetes Currently on clomiphene and AndroGel Although he thinks he subjectively was doing better with this regimen he is feeling more sluggish last 2 weeks but also has had a lot of stress and possibly depression Will need to recheck his testosterone  and LH to determine adjustment of his dosage regimen  Need baseline PSA with testosterone treatment, do not have any records since 2014 of this being tested  DIABETES: He is being managed by his PCP  Currently on Victoza alone with last A1c 6.6 Follow-up A1c to be done today although this may have  been affected by his getting a steroid injection He will start exercising in his pool Encouraged him to work harder on weight loss  Aziya Arena 04/29/2017, 10:50 AM

## 2017-04-30 ENCOUNTER — Telehealth: Payer: Self-pay | Admitting: Family Medicine

## 2017-04-30 NOTE — Telephone Encounter (Signed)
Returning call RE lab results.  Thank you,  -LL

## 2017-04-30 NOTE — Telephone Encounter (Signed)
Called patient's cell and home number and not able to leave a voice mail on cell number but did leave another voice message on home phone to call back so I can go over lab results.

## 2017-04-30 NOTE — Telephone Encounter (Signed)
Patient returning phone call about lab results. Patient requesting a call after 11am due to having meetings in the morning. Please call patient back.

## 2017-04-30 NOTE — Progress Notes (Signed)
Please call to let patient know that the testosterone level is about 300, pituitary gland activity is increased which is good, testosterone level should improve further with time and weight loss His A1c is 7.5, need to know what dose of Victoza he is taking, also needs to check readings 2 hours after eating

## 2017-05-05 DIAGNOSIS — I10 Essential (primary) hypertension: Secondary | ICD-10-CM | POA: Diagnosis not present

## 2017-05-05 DIAGNOSIS — M5124 Other intervertebral disc displacement, thoracic region: Secondary | ICD-10-CM | POA: Diagnosis not present

## 2017-05-05 DIAGNOSIS — M961 Postlaminectomy syndrome, not elsewhere classified: Secondary | ICD-10-CM | POA: Diagnosis not present

## 2017-05-05 DIAGNOSIS — Z6839 Body mass index (BMI) 39.0-39.9, adult: Secondary | ICD-10-CM | POA: Diagnosis not present

## 2017-06-08 DIAGNOSIS — H25813 Combined forms of age-related cataract, bilateral: Secondary | ICD-10-CM | POA: Diagnosis not present

## 2017-06-08 DIAGNOSIS — E119 Type 2 diabetes mellitus without complications: Secondary | ICD-10-CM | POA: Diagnosis not present

## 2017-06-08 DIAGNOSIS — H353111 Nonexudative age-related macular degeneration, right eye, early dry stage: Secondary | ICD-10-CM | POA: Diagnosis not present

## 2017-06-08 DIAGNOSIS — H524 Presbyopia: Secondary | ICD-10-CM | POA: Diagnosis not present

## 2017-06-08 LAB — HM DIABETES EYE EXAM

## 2017-06-15 ENCOUNTER — Telehealth: Payer: Self-pay

## 2017-06-15 NOTE — Telephone Encounter (Signed)
Spoke with the patient and gave him Dr. Jodelle Green advice and he stated an understanding

## 2017-06-15 NOTE — Telephone Encounter (Signed)
He needs to be seen by his PCP or urologist, this is nothing to do with his testosterone treatment

## 2017-06-15 NOTE — Telephone Encounter (Signed)
Spoke with the patient and he stated is having the same pain as when he had prostate infection years ago he is having pain in right testicle that is keeping him up at night -patient would like to know if there is anything that can be done or if he should come in sooner than 06/29/17 please advise

## 2017-06-29 ENCOUNTER — Ambulatory Visit (INDEPENDENT_AMBULATORY_CARE_PROVIDER_SITE_OTHER): Payer: PRIVATE HEALTH INSURANCE | Admitting: Endocrinology

## 2017-06-29 ENCOUNTER — Encounter: Payer: Self-pay | Admitting: Endocrinology

## 2017-06-29 ENCOUNTER — Other Ambulatory Visit: Payer: Self-pay | Admitting: Endocrinology

## 2017-06-29 VITALS — BP 130/76 | HR 89 | Ht 68.0 in | Wt 268.4 lb

## 2017-06-29 DIAGNOSIS — E669 Obesity, unspecified: Secondary | ICD-10-CM | POA: Diagnosis not present

## 2017-06-29 DIAGNOSIS — E291 Testicular hypofunction: Secondary | ICD-10-CM | POA: Diagnosis not present

## 2017-06-29 DIAGNOSIS — N521 Erectile dysfunction due to diseases classified elsewhere: Secondary | ICD-10-CM

## 2017-06-29 DIAGNOSIS — E1169 Type 2 diabetes mellitus with other specified complication: Secondary | ICD-10-CM | POA: Diagnosis not present

## 2017-06-29 LAB — LUTEINIZING HORMONE: LH: 11.46 m[IU]/mL — ABNORMAL HIGH (ref 1.50–9.30)

## 2017-06-29 LAB — BASIC METABOLIC PANEL
BUN: 22 mg/dL (ref 6–23)
CALCIUM: 9.6 mg/dL (ref 8.4–10.5)
CO2: 27 mEq/L (ref 19–32)
Chloride: 105 mEq/L (ref 96–112)
Creatinine, Ser: 1.08 mg/dL (ref 0.40–1.50)
GFR: 74.2 mL/min (ref >60.00)
Glucose, Bld: 136 mg/dL — ABNORMAL HIGH (ref 70–99)
Potassium: 4.9 mEq/L (ref 3.5–5.1)
SODIUM: 139 meq/L (ref 135–145)

## 2017-06-29 LAB — TESTOSTERONE: Testosterone: 260.43 ng/dL — ABNORMAL LOW (ref 300.00–890.00)

## 2017-06-29 MED ORDER — SILDENAFIL CITRATE 50 MG PO TABS: 100.0000 mg | ORAL_TABLET | Freq: Every day | ORAL | 0 refills | Status: DC | PRN

## 2017-06-29 NOTE — Patient Instructions (Addendum)
Check Axiron and Harlene Salts; cialis or Viagra

## 2017-06-29 NOTE — Progress Notes (Signed)
Patient ID: Casey [name], male   DOB: 31-May-1956, 61 y.o.   MRN: [id number]            Referring physician: Olen Pel  Reason for consultation: Endocrinology follow-up    Chief complaint: Follow-up  History of Present Illness  Hypogonadismwas diagnosed in 03/2016  He  had complaints offatigue, decreased motivation and lack of energy when he was first evaluated The symptoms started 2-3 years previously  Also although he does have some decreased libido he has more issues with erectile dysfunction for some time  Prior lab results showtestosterone levels of: Baseline testosterone level 261, normal >300 Prolactin level was normal at 4.1 However LH levels not done.  Initially the patient was prescribed Androderm but this was not approved and eventually he was able to get AndroGel prior authorized He does not think his energy level improved much with AndroGel and his testosterone levels continue to be in the 200+ range His PCP switched him from the gel to the testosterone injections, this is probably prior to 9/17 but detailed records are not available With the testosterone injections he says he feels increased energy for the first few days but by the end of the second week he starts feeling markedly fatigued again.  Initially was getting injections monthly but since his level was as low as 143 the dose was increased to twice a month and from 100 up to 200 mg intermuscular  RECENT history: On his initial evaluation his testosterone level was relatively high with because of his inconsistent subjective improvement with injections With switching to clomiphene and AndroGel  he started feeling better and was having consistent energy level He was feeling tired on his last visit in May 2018 but he was also having more stressed Also his level had come down to 303  With continuing his clomiphene half tablet 3 times a week he is having more energy on daily basis His libido is also  improved Currently using 1 1/2 packets of AndroGel and he does not like this because of the sticky gel He is using this fairly regularly  Labs pending  Lab Results  Component Value Date   TESTOSTERONE 302.63 04/29/2017   TESTOSTERONE 607 01/20/2017    Prolactin level: 4.1  Lab Results  Component Value Date   LH 10.01 (H) 04/29/2017      OTHER active problems discussed today: See review of systems     Allergies as of 06/29/2017      Reactions   Penicillins Hives   When he was 9   Percocet [oxycodone-acetaminophen] Anxiety      Medication List       Accurate as of 06/29/17 11:05 AM. Always use your most recent med list.          aspirin 81 MG EC tablet Take 81 mg by mouth daily. Swallow whole.   atenolol 100 MG tablet Commonly known as:  TENORMIN Take 100 mg by mouth daily.   clomiPHENE 50 MG tablet Commonly known as:  CLOMID TAKE ONE-HALF TABLET BY MOUTH THREE times PER WEEK   glimepiride 2 MG tablet Commonly known as:  AMARYL Take 1 Tablet by mouth once daily for diabetes   HYDROcodone-acetaminophen 10-325 MG tablet Commonly known as:  NORCO TK 1 T PO Q 4 H PRN P DO NOT EXCEED 6 TS PER DAY   losartan 100 MG tablet Commonly known as:  COZAAR Take 100 mg by mouth daily.   metFORMIN 1000 MG tablet Commonly known as:  GLUCOPHAGE Take 500 mg by mouth 2 (two) times daily with a meal. Need to be cut: had lap band surgery   multivitamin tablet Take 1 tablet by mouth daily.   testosterone 50 MG/5GM (1%) Gel Commonly known as:  ANDROGEL Apply contents of 1 tube (5 grams) on 1 arm and 1/2 tube (2.5 grams) on the other arm once daily, as directed   VICTOZA 18 MG/3ML Sopn Generic drug:  liraglutide Inject 1.8 mg into the skin daily.       Allergies:  Allergies  Allergen Reactions  . Penicillins Hives    When he was 9  . Percocet [Oxycodone-Acetaminophen] Anxiety    Past Medical History:  Diagnosis Date  . Arthritis   . Complication of  anesthesia    diff. waking up  . Diabetes mellitus   . Headache(784.0)   . Hypertension    dr Marisue Humble   pcp    Past Surgical History:  Procedure Laterality Date  . BACK SURGERY     5  back surgeries  . CARPAL TUNNEL RELEASE     bil  . COLON SURGERY     2004 for colon rupture  . HERNIA REPAIR    . LAPAROSCOPIC GASTRIC BANDING    . MANDIBLE FRACTURE SURGERY     x 2  . TONSILLECTOMY      Family History  Problem Relation Age of Onset  . Diabetes Maternal Grandfather   . Heart disease Neg Hx     Social History:  reports that he quit smoking about 11 years ago. His smoking use included Cigarettes. He has a 13.50 pack-year smoking history. He has never used smokeless tobacco. He reports that he does not drink alcohol or use drugs.  Review of Systems    DIABETES: He has had this for about 14 years About 3 years ago his weight loss physician started him on Victoza to help with weight loss  Off Actos as we felt that it was not helping his control and difficulty losing weight may have been from this  On his last visit his blood sugars are higher from a steroid injection Most recent A1c was 7.5 in May  More recently has had better control as he is able to get more active Has lost 5 pounds He thinks his blood sugars are close to 100 and the mornings and highest 128 after meals His weight loss physician asked him to continue Victoza but needs to prescription from here. He thinks he had much better control of food cravings with starting Victoza 1.8 mg and wants to continue   Wt Readings from Last 3 Encounters:  06/29/17 268 lb 6.4 oz (121.7 kg)  04/29/17 273 lb 6.4 oz (124 kg)  01/20/17 278 lb (126.1 kg)   Lab Results  Component Value Date   HGBA1C 7.5 (H) 04/29/2017   Lab Results  Component Value Date   CREATININE 0.78 03/18/2012    ERECTILE dysfunction: He has not discussed this with his PCP and would like some treatment  General Examination:   BP 130/76   Pulse  89   Ht 5\' 8"  (1.727 m)   Wt 268 lb 6.4 oz (121.7 kg)   SpO2 97%   BMI 40.81 kg/m     Assessment/ Plan:  Hypogonadism, this is secondary to insulin resistance syndrome related to his marked obesity and history of diabetes Currently on low-dose clomiphene and AndroGel He does not like the gel and his insurance does not cover Androderm and he  will check with insurance about alternatives, prefer not to do the injections again Subjectively is feeling better more recently and is losing some weight also Testosterone level pending  Erectile dysfunction: Discussed that this is not related to his hypogonadism Prescription given for sildenafil 100 mg, to take half to one tablet and discussed how to use this and possible side effects   DIABETES: He is being managed by his PCP but he wants his prescription for Victoza from here He says he is doing well with his blood sugar control and is starting to lose weight Agreed that he can continue Victoza long-term to help with his diabetes control and obesity Encouraged him to stay regular with exercises and check blood sugars either fasting or postprandial by rotation Blood sugar targets discussed  Counseling time on subjects discussed in assessment and plan sections is over 50% of today's 25 minute visit   Willy Pinkerton 06/29/2017, 11:05 AM

## 2017-06-30 ENCOUNTER — Other Ambulatory Visit: Payer: Self-pay

## 2017-06-30 MED ORDER — TESTOSTERONE 50 MG/5GM (1%) TD GEL: TRANSDERMAL | 2 refills | Status: DC

## 2017-07-09 DIAGNOSIS — Z0000 Encounter for general adult medical examination without abnormal findings: Secondary | ICD-10-CM | POA: Diagnosis not present

## 2017-07-09 DIAGNOSIS — E7800 Pure hypercholesterolemia, unspecified: Secondary | ICD-10-CM | POA: Diagnosis not present

## 2017-07-09 DIAGNOSIS — Z125 Encounter for screening for malignant neoplasm of prostate: Secondary | ICD-10-CM | POA: Diagnosis not present

## 2017-07-09 DIAGNOSIS — Z1159 Encounter for screening for other viral diseases: Secondary | ICD-10-CM | POA: Diagnosis not present

## 2017-07-09 DIAGNOSIS — Z7984 Long term (current) use of oral hypoglycemic drugs: Secondary | ICD-10-CM | POA: Diagnosis not present

## 2017-07-09 DIAGNOSIS — I10 Essential (primary) hypertension: Secondary | ICD-10-CM | POA: Diagnosis not present

## 2017-07-09 DIAGNOSIS — Z6841 Body Mass Index (BMI) 40.0 and over, adult: Secondary | ICD-10-CM | POA: Diagnosis not present

## 2017-07-09 DIAGNOSIS — E669 Obesity, unspecified: Secondary | ICD-10-CM | POA: Diagnosis not present

## 2017-07-09 DIAGNOSIS — R350 Frequency of micturition: Secondary | ICD-10-CM | POA: Diagnosis not present

## 2017-07-09 DIAGNOSIS — E1121 Type 2 diabetes mellitus with diabetic nephropathy: Secondary | ICD-10-CM | POA: Diagnosis not present

## 2017-08-04 DIAGNOSIS — Z6838 Body mass index (BMI) 38.0-38.9, adult: Secondary | ICD-10-CM | POA: Diagnosis not present

## 2017-08-04 DIAGNOSIS — M961 Postlaminectomy syndrome, not elsewhere classified: Secondary | ICD-10-CM | POA: Diagnosis not present

## 2017-08-04 DIAGNOSIS — I10 Essential (primary) hypertension: Secondary | ICD-10-CM | POA: Diagnosis not present

## 2017-08-31 DIAGNOSIS — R070 Pain in throat: Secondary | ICD-10-CM | POA: Diagnosis not present

## 2017-09-02 DIAGNOSIS — J36 Peritonsillar abscess: Secondary | ICD-10-CM | POA: Diagnosis not present

## 2017-09-03 DIAGNOSIS — J392 Other diseases of pharynx: Secondary | ICD-10-CM | POA: Diagnosis not present

## 2017-09-03 DIAGNOSIS — Z87891 Personal history of nicotine dependence: Secondary | ICD-10-CM | POA: Diagnosis not present

## 2017-09-03 DIAGNOSIS — L03818 Cellulitis of other sites: Secondary | ICD-10-CM | POA: Diagnosis not present

## 2017-09-03 DIAGNOSIS — D110 Benign neoplasm of parotid gland: Secondary | ICD-10-CM | POA: Diagnosis not present

## 2017-09-03 DIAGNOSIS — Z8709 Personal history of other diseases of the respiratory system: Secondary | ICD-10-CM | POA: Diagnosis not present

## 2017-09-03 DIAGNOSIS — J343 Hypertrophy of nasal turbinates: Secondary | ICD-10-CM | POA: Diagnosis not present

## 2017-09-09 DIAGNOSIS — J390 Retropharyngeal and parapharyngeal abscess: Secondary | ICD-10-CM | POA: Diagnosis not present

## 2017-09-09 DIAGNOSIS — D110 Benign neoplasm of parotid gland: Secondary | ICD-10-CM | POA: Diagnosis not present

## 2017-10-02 ENCOUNTER — Other Ambulatory Visit: Payer: Self-pay | Admitting: Endocrinology

## 2017-10-29 DIAGNOSIS — M5124 Other intervertebral disc displacement, thoracic region: Secondary | ICD-10-CM | POA: Diagnosis not present

## 2017-10-29 DIAGNOSIS — Z6839 Body mass index (BMI) 39.0-39.9, adult: Secondary | ICD-10-CM | POA: Diagnosis not present

## 2017-10-29 DIAGNOSIS — M961 Postlaminectomy syndrome, not elsewhere classified: Secondary | ICD-10-CM | POA: Diagnosis not present

## 2017-10-29 DIAGNOSIS — I10 Essential (primary) hypertension: Secondary | ICD-10-CM | POA: Diagnosis not present

## 2017-10-30 ENCOUNTER — Ambulatory Visit: Payer: PRIVATE HEALTH INSURANCE | Admitting: Endocrinology

## 2017-12-17 DIAGNOSIS — L570 Actinic keratosis: Secondary | ICD-10-CM | POA: Diagnosis not present

## 2017-12-17 DIAGNOSIS — L821 Other seborrheic keratosis: Secondary | ICD-10-CM | POA: Diagnosis not present

## 2017-12-17 DIAGNOSIS — D225 Melanocytic nevi of trunk: Secondary | ICD-10-CM | POA: Diagnosis not present

## 2017-12-17 DIAGNOSIS — C44311 Basal cell carcinoma of skin of nose: Secondary | ICD-10-CM | POA: Diagnosis not present

## 2017-12-17 DIAGNOSIS — X32XXXA Exposure to sunlight, initial encounter: Secondary | ICD-10-CM | POA: Diagnosis not present

## 2018-01-07 DIAGNOSIS — E669 Obesity, unspecified: Secondary | ICD-10-CM | POA: Diagnosis not present

## 2018-01-07 DIAGNOSIS — Z6839 Body mass index (BMI) 39.0-39.9, adult: Secondary | ICD-10-CM | POA: Diagnosis not present

## 2018-01-07 DIAGNOSIS — E1121 Type 2 diabetes mellitus with diabetic nephropathy: Secondary | ICD-10-CM | POA: Diagnosis not present

## 2018-01-07 DIAGNOSIS — Z1211 Encounter for screening for malignant neoplasm of colon: Secondary | ICD-10-CM | POA: Diagnosis not present

## 2018-01-07 DIAGNOSIS — I10 Essential (primary) hypertension: Secondary | ICD-10-CM | POA: Diagnosis not present

## 2018-01-07 DIAGNOSIS — E291 Testicular hypofunction: Secondary | ICD-10-CM | POA: Diagnosis not present

## 2018-01-07 DIAGNOSIS — E7800 Pure hypercholesterolemia, unspecified: Secondary | ICD-10-CM | POA: Diagnosis not present

## 2018-01-14 DIAGNOSIS — C44311 Basal cell carcinoma of skin of nose: Secondary | ICD-10-CM | POA: Diagnosis not present

## 2018-01-17 ENCOUNTER — Other Ambulatory Visit: Payer: Self-pay | Admitting: Endocrinology

## 2018-01-19 DIAGNOSIS — I10 Essential (primary) hypertension: Secondary | ICD-10-CM | POA: Diagnosis not present

## 2018-01-19 DIAGNOSIS — Z6839 Body mass index (BMI) 39.0-39.9, adult: Secondary | ICD-10-CM | POA: Diagnosis not present

## 2018-02-16 ENCOUNTER — Other Ambulatory Visit: Payer: Self-pay | Admitting: Endocrinology

## 2018-02-18 DIAGNOSIS — D110 Benign neoplasm of parotid gland: Secondary | ICD-10-CM | POA: Diagnosis not present

## 2018-02-18 DIAGNOSIS — J329 Chronic sinusitis, unspecified: Secondary | ICD-10-CM | POA: Diagnosis not present

## 2018-02-25 DIAGNOSIS — C44311 Basal cell carcinoma of skin of nose: Secondary | ICD-10-CM | POA: Diagnosis not present

## 2018-04-05 DIAGNOSIS — D485 Neoplasm of uncertain behavior of skin: Secondary | ICD-10-CM | POA: Diagnosis not present

## 2018-04-13 DIAGNOSIS — M5124 Other intervertebral disc displacement, thoracic region: Secondary | ICD-10-CM | POA: Diagnosis not present

## 2018-04-13 DIAGNOSIS — M961 Postlaminectomy syndrome, not elsewhere classified: Secondary | ICD-10-CM | POA: Diagnosis not present

## 2018-04-19 ENCOUNTER — Other Ambulatory Visit: Payer: Self-pay | Admitting: Endocrinology

## 2018-05-17 DIAGNOSIS — Z08 Encounter for follow-up examination after completed treatment for malignant neoplasm: Secondary | ICD-10-CM | POA: Diagnosis not present

## 2018-05-17 DIAGNOSIS — Z85828 Personal history of other malignant neoplasm of skin: Secondary | ICD-10-CM | POA: Diagnosis not present

## 2018-06-01 DIAGNOSIS — J320 Chronic maxillary sinusitis: Secondary | ICD-10-CM | POA: Diagnosis not present

## 2018-06-01 DIAGNOSIS — J342 Deviated nasal septum: Secondary | ICD-10-CM | POA: Diagnosis not present

## 2018-06-16 ENCOUNTER — Other Ambulatory Visit: Payer: Self-pay | Admitting: Otolaryngology

## 2018-06-21 DIAGNOSIS — Z08 Encounter for follow-up examination after completed treatment for malignant neoplasm: Secondary | ICD-10-CM | POA: Diagnosis not present

## 2018-06-21 DIAGNOSIS — Z85828 Personal history of other malignant neoplasm of skin: Secondary | ICD-10-CM | POA: Diagnosis not present

## 2018-06-25 ENCOUNTER — Other Ambulatory Visit: Payer: Self-pay | Admitting: Endocrinology

## 2018-07-01 DIAGNOSIS — I4891 Unspecified atrial fibrillation: Secondary | ICD-10-CM

## 2018-07-01 DIAGNOSIS — I499 Cardiac arrhythmia, unspecified: Secondary | ICD-10-CM

## 2018-07-01 HISTORY — DX: Unspecified atrial fibrillation: I48.91

## 2018-07-01 HISTORY — DX: Cardiac arrhythmia, unspecified: I49.9

## 2018-07-07 DIAGNOSIS — E669 Obesity, unspecified: Secondary | ICD-10-CM | POA: Diagnosis not present

## 2018-07-07 DIAGNOSIS — E7800 Pure hypercholesterolemia, unspecified: Secondary | ICD-10-CM | POA: Diagnosis not present

## 2018-07-07 DIAGNOSIS — I10 Essential (primary) hypertension: Secondary | ICD-10-CM | POA: Diagnosis not present

## 2018-07-07 DIAGNOSIS — E1121 Type 2 diabetes mellitus with diabetic nephropathy: Secondary | ICD-10-CM | POA: Diagnosis not present

## 2018-07-07 DIAGNOSIS — E291 Testicular hypofunction: Secondary | ICD-10-CM | POA: Diagnosis not present

## 2018-07-07 DIAGNOSIS — Z125 Encounter for screening for malignant neoplasm of prostate: Secondary | ICD-10-CM | POA: Diagnosis not present

## 2018-07-07 NOTE — Pre-Procedure Instructions (Signed)
Casey [name]  07/07/2018      [facility] [place] [facility] [address], [place] [address] Phone: [phone] Fax: [phone]  [id number] - [place], [address] [place] [address] Phone: [phone] Fax: [phone]    Your procedure is scheduled on Fri., Aug, 16, 2019 from 10:04am-12:17pm  Report to [facility] Admitting Entrance "A" at 8:00AM  Call this number if you have problems the morning of surgery:  [phone]   Remember:  Do not eat or drink after midnight on Aug. 15th    Take these medicines the morning of surgery with A SIP OF WATER: Atenolol (TENORMIN)   If needed HYDROcodone-acetaminophen [facility])  Follow your surgeon's instructions on when to stop Aspirin.  If no instructions were given by your surgeon then you will need to call the office to get those instructions.    7 days before surgery (8/9), stop taking all Other Aspirin Products, Vitamins, Fish oils, and Herbal medications. Also stop all NSAIDS i.e. Advil, Ibuprofen, Motrin, Aleve, Anaprox, Naproxen, BC, Goody Powders, and all Supplements.   How to Manage Your Diabetes Before and After Surgery  Why is it important to control my blood sugar before and after surgery? . Improving blood sugar levels before and after surgery helps healing and can limit problems. . A way of improving blood sugar control is eating a healthy diet by: o  Eating less sugar and carbohydrates o  Increasing activity/exercise o  Talking with your doctor about reaching your blood sugar goals . High blood sugars (greater than 180 mg/dL) can raise your risk of infections and slow your recovery, so you will need to focus on controlling your diabetes during the weeks before surgery. . Make sure that the doctor who takes care of your diabetes knows about your planned surgery including the date and location.  How do I manage my blood sugar  before surgery? . Check your blood sugar at least 4 times a day, starting 2 days before surgery, to make sure that the level is not too high or low. o Check your blood sugar the morning of your surgery when you wake up and every 2 hours until you get to the Short Stay unit. . If your blood sugar is less than 70 mg/dL, you will need to treat for low blood sugar: o Do not take insulin. o Treat a low blood sugar (less than 70 mg/dL) with  cup of clear juice (cranberry or apple), 4 glucose tablets, OR glucose gel. Recheck blood sugar in 15 minutes after treatment (to make sure it is greater than 70 mg/dL). If your blood sugar is not greater than 70 mg/dL on recheck, call [phone] o  for further instructions. .  If your CBG is greater than 220 mg/dL, you may take  of your sliding scale (correction) dose of insulin.  . If you are admitted to the hospital after surgery: o Your blood sugar will be checked by the staff and you will probably be given insulin after surgery (instead of oral diabetes medicines) to make sure you have good blood sugar levels. o The goal for blood sugar control after surgery is 80-180 mg/dL.  WHAT DO I DO ABOUT MY DIABETES MEDICATION?  [name] Do not take Glimepiride (AMARYL) and MetFORMIN (GLUCOPHAGE) the morning of surgery.  . The day of surgery, do not take other diabetes injectable Victoza (liraglutide)  Reviewed  and Endorsed by [facility] Patient Education Committee, August 2015   Do not wear jewelry.  Do not wear lotions, powders, colognes, or deodorant.  Do not shave 48 hours prior to surgery.  Men may shave face.  Do not bring valuables to the hospital.  [facility] is not responsible for any belongings or valuables.  Contacts, dentures or bridgework may not be worn into surgery.  Leave your suitcase in the car.  After surgery it may be brought to your room.  For patients admitted to the hospital, discharge time will be determined by your treatment  team.  Patients discharged the day of surgery will not be allowed to drive home.   Special instructions:   [place]- Preparing For Surgery  Before surgery, you can play an important role. Because skin is not sterile, your skin needs to be as free of germs as possible. You can reduce the number of germs on your skin by washing with CHG (chlorahexidine gluconate) Soap before surgery.  CHG is an antiseptic cleaner which kills germs and bonds with the skin to continue killing germs even after washing.    Oral Hygiene is also important to reduce your risk of infection.  Remember - BRUSH YOUR TEETH THE MORNING OF SURGERY WITH YOUR REGULAR TOOTHPASTE  Please do not use if you have an allergy to CHG or antibacterial soaps. If your skin becomes reddened/irritated stop using the CHG.  Do not shave (including legs and underarms) for at least 48 hours prior to first CHG shower. It is OK to shave your face.  Please follow these instructions carefully.   1. Shower the NIGHT BEFORE SURGERY and the MORNING OF SURGERY with CHG.   2. If you chose to wash your hair, wash your hair first as usual with your normal shampoo.  3. After you shampoo, rinse your hair and body thoroughly to remove the shampoo.  4. Use CHG as you would any other liquid soap. You can apply CHG directly to the skin and wash gently with a scrungie or a clean washcloth.   5. Apply the CHG Soap to your body ONLY FROM THE NECK DOWN.  Do not use on open wounds or open sores. Avoid contact with your eyes, ears, mouth and genitals (private parts). Wash Face and genitals (private parts)  with your normal soap.  6. Wash thoroughly, paying special attention to the area where your surgery will be performed.  7. Thoroughly rinse your body with warm water from the neck down.  8. DO NOT shower/wash with your normal soap after using and rinsing off the CHG Soap.  9. Pat yourself dry with a CLEAN TOWEL.  10. Wear CLEAN PAJAMAS to bed the  night before surgery, wear comfortable clothes the morning of surgery  11. Place CLEAN SHEETS on your bed the night of your first shower and DO NOT SLEEP WITH PETS.  Day of Surgery:  Do not apply any deodorants/lotions.  Please wear clean clothes to the hospital/surgery center.   Remember to brush your teeth WITH YOUR REGULAR TOOTHPASTE.  Please read over the following fact sheets that you were given. Pain Booklet, Coughing and Deep Breathing and Surgical Site Infection Prevention

## 2018-07-08 ENCOUNTER — Other Ambulatory Visit: Payer: Self-pay

## 2018-07-08 ENCOUNTER — Ambulatory Visit (HOSPITAL_COMMUNITY)
Admission: RE | Admit: 2018-07-08 | Discharge: 2018-07-08 | Disposition: A | Discharge status: Home / Self Care | Payer: PRIVATE HEALTH INSURANCE | Source: Ambulatory Visit | Attending: Nurse Practitioner | Admitting: Nurse Practitioner

## 2018-07-08 ENCOUNTER — Encounter (HOSPITAL_COMMUNITY): Payer: Self-pay

## 2018-07-08 ENCOUNTER — Encounter (HOSPITAL_COMMUNITY): Payer: Self-pay | Admitting: Nurse Practitioner

## 2018-07-08 ENCOUNTER — Encounter (HOSPITAL_COMMUNITY): Payer: Self-pay | Admitting: Vascular Surgery

## 2018-07-08 ENCOUNTER — Encounter (HOSPITAL_COMMUNITY)
Admission: RE | Admit: 2018-07-08 | Discharge: 2018-07-08 | Disposition: A | Discharge status: Home / Self Care | Payer: PRIVATE HEALTH INSURANCE | Source: Ambulatory Visit | Attending: Otolaryngology | Admitting: Otolaryngology

## 2018-07-08 VITALS — BP 142/94 | HR 99 | Ht 70.0 in | Wt 271.0 lb

## 2018-07-08 DIAGNOSIS — Z7984 Long term (current) use of oral hypoglycemic drugs: Secondary | ICD-10-CM | POA: Insufficient documentation

## 2018-07-08 DIAGNOSIS — M1990 Unspecified osteoarthritis, unspecified site: Secondary | ICD-10-CM | POA: Diagnosis not present

## 2018-07-08 DIAGNOSIS — G4730 Sleep apnea, unspecified: Secondary | ICD-10-CM | POA: Diagnosis not present

## 2018-07-08 DIAGNOSIS — Z79891 Long term (current) use of opiate analgesic: Secondary | ICD-10-CM | POA: Insufficient documentation

## 2018-07-08 DIAGNOSIS — Z833 Family history of diabetes mellitus: Secondary | ICD-10-CM | POA: Diagnosis not present

## 2018-07-08 DIAGNOSIS — Z9889 Other specified postprocedural states: Secondary | ICD-10-CM | POA: Diagnosis not present

## 2018-07-08 DIAGNOSIS — Z87891 Personal history of nicotine dependence: Secondary | ICD-10-CM | POA: Diagnosis not present

## 2018-07-08 DIAGNOSIS — I10 Essential (primary) hypertension: Secondary | ICD-10-CM | POA: Diagnosis not present

## 2018-07-08 DIAGNOSIS — Z880 Allergy status to penicillin: Secondary | ICD-10-CM | POA: Insufficient documentation

## 2018-07-08 DIAGNOSIS — Z791 Long term (current) use of non-steroidal anti-inflammatories (NSAID): Secondary | ICD-10-CM | POA: Insufficient documentation

## 2018-07-08 DIAGNOSIS — I4891 Unspecified atrial fibrillation: Secondary | ICD-10-CM | POA: Insufficient documentation

## 2018-07-08 DIAGNOSIS — I481 Persistent atrial fibrillation: Secondary | ICD-10-CM

## 2018-07-08 DIAGNOSIS — Z79899 Other long term (current) drug therapy: Secondary | ICD-10-CM | POA: Diagnosis not present

## 2018-07-08 DIAGNOSIS — R9431 Abnormal electrocardiogram [ECG] [EKG]: Secondary | ICD-10-CM | POA: Insufficient documentation

## 2018-07-08 DIAGNOSIS — Z7982 Long term (current) use of aspirin: Secondary | ICD-10-CM | POA: Insufficient documentation

## 2018-07-08 DIAGNOSIS — I4819 Other persistent atrial fibrillation: Secondary | ICD-10-CM

## 2018-07-08 DIAGNOSIS — Z885 Allergy status to narcotic agent status: Secondary | ICD-10-CM | POA: Diagnosis not present

## 2018-07-08 DIAGNOSIS — E119 Type 2 diabetes mellitus without complications: Secondary | ICD-10-CM | POA: Insufficient documentation

## 2018-07-08 HISTORY — DX: Sleep apnea, unspecified: G47.30

## 2018-07-08 LAB — CBC
HCT: 48.9 % (ref 39.0–52.0)
HEMOGLOBIN: 15.9 g/dL (ref 13.0–17.0)
MCH: 31.4 pg (ref 26.0–34.0)
MCHC: 32.5 g/dL (ref 30.0–36.0)
MCV: 96.4 fL (ref 78.0–100.0)
Platelets: 182 10*3/uL (ref 150–400)
RBC: 5.07 MIL/uL (ref 4.22–5.81)
RDW: 13.7 % (ref 11.5–15.5)
WBC: 8.3 10*3/uL (ref 4.0–10.5)

## 2018-07-08 LAB — BASIC METABOLIC PANEL
Anion gap: 9 (ref 5–15)
BUN: 19 mg/dL (ref 6–20)
CHLORIDE: 104 mmol/L (ref 98–111)
CO2: 27 mmol/L (ref 22–32)
CREATININE: 1.19 mg/dL (ref 0.61–1.24)
Calcium: 9 mg/dL (ref 8.9–10.3)
GFR calc Af Amer: >60 mL/min (ref >60)
GFR calc non Af Amer: >60 mL/min (ref >60)
GLUCOSE: 124 mg/dL — AB (ref 70–99)
Potassium: 4 mmol/L (ref 3.5–5.1)
SODIUM: 140 mmol/L (ref 135–145)

## 2018-07-08 LAB — GLUCOSE, CAPILLARY: Glucose-Capillary: 119 mg/dL — ABNORMAL HIGH (ref 70–99)

## 2018-07-08 NOTE — Patient Instructions (Signed)
Take an extra 25mg  of atenolol the night before surgery. Take your normal dosing of 100mg  morning of surgery with a sip of water

## 2018-07-08 NOTE — Progress Notes (Signed)
Primary Care Physician: Orpah Melter, MD Referring Physician: Dr. Fransico Him   Casey [name]. is a 62 y.o. male with a h/o obesity, s/p gastric sleeve, HTN, DM that presented for pre op for rt parotidectomy today and was found to be in rate controlled afib. He was sent to the afib clinic for evaluation. He is pending this surgery next Friday and does not want to delay surgery as the parotid gland is getting bigger and starting to affect his hearing. He has felt some fatigue, less stamina for several months. Was just seen by PCP yesterday and nothing out of the ordinary was noted with his heart rhythm. He denies any exertional chest pain.   He does not drink alcohol, no tobacco use,no excessive caffeine. He has kept his weight stable for several years. Was almost 100 lbs heavier before gastric sleeve.  Today, he denies symptoms of palpitations, chest pain, shortness of breath, orthopnea, PND, lower extremity edema, dizziness, presyncope, syncope, or neurologic sequela.+ for fatigue. The patient is tolerating medications without difficulties and is otherwise without complaint today.   Past Medical History:  Diagnosis Date  . Arthritis   . Complication of anesthesia    diff. waking up  . Diabetes mellitus   . Headache(784.0)   . Hypertension    dr Marisue Humble   pcp  . Sleep apnea    prior to weight lose surgery-Dr. Don Perking not use CPAP   Past Surgical History:  Procedure Laterality Date  . BACK SURGERY     5  back surgeries  . CARPAL TUNNEL RELEASE     bil  . COLON SURGERY     2004 for colon rupture  . HERNIA REPAIR    . LAPAROSCOPIC GASTRIC BANDING    . MANDIBLE FRACTURE SURGERY     x 2  . TONSILLECTOMY      Current Outpatient Medications  Medication Sig Dispense Refill  . aspirin 81 MG EC tablet Take 81 mg by mouth at bedtime. Swallow whole.     [name] atenolol (TENORMIN) 100 MG tablet Take 100 mg by mouth daily.  5  . clomiPHENE (CLOMID) 50 MG tablet Take 25 mg  by mouth 3 (three) times a week.    . docusate sodium (COLACE) 100 MG capsule Take 200 mg by mouth daily.    [name] glimepiride (AMARYL) 2 MG tablet Take 2 mg by mouth daily with breakfast.    . HYDROcodone-acetaminophen (NORCO) 10-325 MG tablet Take 1 tablet by mouth every 4 (four) hours as needed (for severe back pain.).    [name] liraglutide (VICTOZA) 18 MG/3ML SOPN Inject 1.8 mg into the skin every evening.     [name] losartan (COZAAR) 100 MG tablet Take 100 mg by mouth daily.    . metFORMIN (GLUCOPHAGE) 500 MG tablet Take 1,000 mg by mouth 2 (two) times daily.    . Multiple Vitamin (MULTIVITAMIN WITH MINERALS) TABS tablet Take 1 tablet by mouth daily.    . naproxen sodium (ALEVE) 220 MG tablet Take 220-440 mg by mouth 2 (two) times daily as needed (for pain).     No current facility-administered medications for this encounter.     Allergies  Allergen Reactions  . Penicillins Hives    Has patient had a PCN reaction causing immediate rash, facial/tongue/throat swelling, SOB or lightheadedness with hypotension: Unknown Has patient had a PCN reaction causing severe rash involving mucus membranes or skin necrosis: Unknown Has patient had a PCN reaction that required hospitalization: No Has  patient had a PCN reaction occurring within the last 10 years: No Childhood reaction. If all of the above answers are "NO", then may proceed with Cephalosporin use.   [name] Percocet [Oxycodone-Acetaminophen] Anxiety    Hyperactivity & unhinged.    Social History   Socioeconomic History  . Marital status: Married    Spouse name: Not on file  . Number of children: Not on file  . Years of education: Not on file  . Highest education level: Not on file  Occupational History  . Not on file  Social Needs  . Financial resource strain: Not on file  . Food insecurity:    Worry: Not on file    Inability: Not on file  . Transportation needs:    Medical: Not on file    Non-medical: Not on file  Tobacco Use  . Smoking  status: Former Smoker    Packs/day: 0.90    Years: 15.00    Pack years: 13.50    Types: Cigarettes    Last attempt to quit: 01/19/2006    Years since quitting: 12.4  . Smokeless tobacco: Never Used  Substance and Sexual Activity  . Alcohol use: No  . Drug use: No  . Sexual activity: Not on file  Lifestyle  . Physical activity:    Days per week: Not on file    Minutes per session: Not on file  . Stress: Not on file  Relationships  . Social connections:    Talks on phone: Not on file    Gets together: Not on file    Attends religious service: Not on file    Active member of club or organization: Not on file    Attends meetings of clubs or organizations: Not on file    Relationship status: Not on file  . Intimate partner violence:    Fear of current or ex partner: Not on file    Emotionally abused: Not on file    Physically abused: Not on file    Forced sexual activity: Not on file  Other Topics Concern  . Not on file  Social History Narrative  . Not on file    Family History  Problem Relation Age of Onset  . Diabetes Maternal Grandfather   . Heart disease Neg Hx     ROS- All systems are reviewed and negative except as per the HPI above  Physical Exam: Vitals:   07/08/18 1318  BP: (!) 142/94  Pulse: 99  Weight: 122.9 kg  Height: 5\' 10"  (1.778 m)   Wt Readings from Last 3 Encounters:  07/08/18 122.9 kg  07/08/18 122.5 kg  06/29/17 121.7 kg    Labs: Lab Results  Component Value Date   NA 140 07/08/2018   K 4.0 07/08/2018   CL 104 07/08/2018   CO2 27 07/08/2018   GLUCOSE 124 (H) 07/08/2018   BUN 19 07/08/2018   CREATININE 1.19 07/08/2018   CALCIUM 9.0 07/08/2018   No results found for: INR No results found for: CHOL, HDL, LDLCALC, TRIG   GEN- The patient is well appearing, alert and oriented x 3 today.   Head- normocephalic, atraumatic Eyes-  Sclera clear, conjunctiva pink Ears- hearing intact Oropharynx- clear Neck- supple, no JVP Lymph- no  cervical lymphadenopathy Lungs- Clear to ausculation bilaterally, normal work of breathing Heart- irregular rate and rhythm, no murmurs, rubs or gallops, PMI not laterally displaced GI- soft, NT, ND, + BS Extremities- no clubbing, cyanosis, or edema MS- no significant deformity or atrophy  Skin- no rash or lesion Psych- euthymic mood, full affect Neuro- strength and sensation are intact  EKG- afib at 99 bpm, qrs int 92 ms, qtc 462 ms Epic records reviewed Labs for 8/8 reviewed and wnl   Assessment and Plan: 1. Afib General education re afib Hard at this point to know if paroxysmal or persistent but pt reports feeling fatigued for a couple of months Otherwise, seems to be tolerating ok Does not appear to have fluid weight gain, LEE, PND/orthopnea 2/2 afib Is rate controlled today,, will continue atenolol 100 mg daily Echo   2. Pre op Parotidectomy  Pt does not want to delay surgery Discussed with Dr. Rayann Heman He feels that pt can go ahead with surgery as planned this Friday,8/16, if no significant abnormal findings on echo, it is scheduled for 8/13  Would recommend that pt take an extra 25 mg of atenolol the pm before surgery to make sure he is well rate controlled for surgery and to take his usual  Atenolol 100 mg that am  If he is not rate controlled the am of surgery, Dr. Rayann Heman suggests he be given an extra 25 mg that am   3.CHA2DS2VASc score of 2(htn, DM)  Dr. Rayann Heman advised to wait until after surgery tio start [place] He has a f/u with surgeon 8/21 and I will see after that and start [place]. If he proves to be persistent, then likely 3 weeks of uninterrupted anticoagulation and cardioversion   Butch Penny C. Lucella Pommier, [facility] [address] [place], [place] [address] [phone]

## 2018-07-08 NOTE — Progress Notes (Addendum)
PCP:  Christella Noa, MD  Cardiologist: pt denies  EKG: obtained today  Stress test: 5+ years ago  ECHO: pt reports 20+ years ago-done to determine effects of Weight Loss Medication  Cardiac Cath: pt denies   Chest x-ray: pt denies past year, no recent respiratory infections complications

## 2018-07-08 NOTE — Progress Notes (Signed)
Anesthesia PAT Evaluation:   Case:  [id number] Date/Time:  07/16/18 0949   Procedure:  PAROTIDECTOMY (Left )   Anesthesia type:  General   Pre-op diagnosis:  Right parotid neoplasm   Location:  [place] OR ROOM 09 / [place] OR   Surgeon:  Melida Quitter, MD      DISCUSSION: Casey [name] is a 62 year old male scheduled for the above procedure. Casey [name] reports that parotid mass felt to be likely benign, but will be sent for pathology to confirm.  History includes former smoker (quit '07), DM2, HTN, OSA (improved after weight loss; no CPAP), laparoscopic gastric banding '12, mandibular fracture surgery, perforated colon/abscess (s/p colon resection with temporary colostomy '04). BMI is consistent with obesity. He reports difficultly waking up after anesthesia.   This morning he had his PAT visit for upcoming surgery. His EKG showed rate controlled afib (confirmed by repeat EKG). Poor r wave progression looked improved on the repeat tracing. Casey [name] denied chest pain, SOB, presyncope/syncope, edema. He did report that for the past ~ 2-3 months he has had intermittent episodes where his heart would feel like it was racing for a few minutes. There were no associated symptoms, other than he has felt intermittently fatigued.  He is in the process of selling his mother's (deceased last year) estate and has been under more stress, so attributed his symptoms to this. He denied known history of CAD, afib, CHF. He was hospitalized at [facility] for chest pain ~ 03-13-2009 and underwent testing, but was told pain was referred from his hernia. He later had hernia repair along with lap band surgery in 2011-03-14. His mother died at age 29 and had a history of CABG/AVR '04/'12 and afib. Casey [name] denied ETOH use or energy drinks. Last echo may have been 20 years ago. Says he lost 160 lb with gastric banding surgery, but has gained ~ 60 lb back over the last several years. 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 Denied history of CVA. His daughter is a Marine scientist.  Reported he saw Orpah Melter, MD for his PCP yesterday with A1c of 7.3 (result requested). He was instructed to contact Dr. Redmond Baseman' office regarding preoperative ASA instructions.   I called and spoke with [facility] covering cardiologist Fransico Him, MD regarding Casey [name] with rate controlled afib of undetermined duration. Currently asymptomatic. She has arranged for Casey [name] to be seen by Roderic Palau, NP this afternoon for further evaluation. Casey [name] advised he will likely undergo testing and may be considered for anticoagulation, but formal recommendations and timing of those recommendations will be deferred to cardiology. Chart will be left for follow-up. I will plan to update surgeon once more is known following cardiology visit.    VS: BP (!) 137/91   Pulse 89   Temp 36.6 C   Resp 20   Ht 5\' 10"  (1.778 m)   Wt 122.5 kg   SpO2 98%   BMI 38.74 kg/m  Casey [name] is a pleasant white male in NAD. No conversational dyspnea. He has a short neck. Truncal obesity. Heart sounds are somewhat distant, but no murmur noted. Rhythm is irregularly irregular. No carotid bruit noted. Lung clear. Minimal ankle edema (at socks).    PROVIDERS: Orpah Melter, MD is PCP [facility]) Ernesto Rutherford, MD is endocrinologist. Last visit 06/29/17.    LABS: Labs reviewed: Acceptable for surgery. (all labs ordered are listed, but only abnormal results are displayed)  Labs Reviewed  GLUCOSE,  CAPILLARY - Abnormal; Notable for the following components:      Result Value   Glucose-Capillary 119 (*)    All other components within normal limits  BASIC METABOLIC PANEL - Abnormal; Notable for the following components:   Glucose, Bld 124 (*)    All other components within normal limits  CBC    IMAGES: CT paranasal sinuses 06/01/18 ([place]): IMPRESSION: 1. Previous bilateral maxillary ORIF with mild mucoperiosteal thickening.  Preserved 0MC patency, and patent bilateral accessory maxillary sinus drainage. 2. Mild right frontoethmoidal recess mucosal thickening, otherwise normally aerated paranasal sinuses and patent sinus drainage pathways. 3. Note hyperplastic sphenoid sinus including uncovering of the right carotid canal. Hyperplastic frontal sinuses. 4. Nasal septal deviation and spurring.  EKG: 07/08/18 10:25:30: Afib at 82 bpm. Previous tracing at 10:19 also showed poor r wave progression, cannot rule out anterior infarct. Afib new when compared to 03/25/12 tracing.    CV: Currently, no recent CV testing. Will follow-up cardiology recommendations after 07/08/18 visit.    Past Medical History:  Diagnosis Date  . Arthritis   . Complication of anesthesia    diff. waking up  . Diabetes mellitus   . Headache(784.0)   . Hypertension    dr Marisue Humble   pcp  . Sleep apnea    prior to weight lose surgery-Dr. Don Perking not use CPAP    Past Surgical History:  Procedure Laterality Date  . BACK SURGERY     5  back surgeries  . CARPAL TUNNEL RELEASE     bil  . COLON SURGERY     2004 for colon rupture  . HERNIA REPAIR    . LAPAROSCOPIC GASTRIC BANDING    . MANDIBLE FRACTURE SURGERY     x 2  . TONSILLECTOMY      MEDICATIONS: . aspirin 81 MG EC tablet  . atenolol (TENORMIN) 100 MG tablet  . clomiPHENE (CLOMID) 50 MG tablet  . docusate sodium (COLACE) 100 MG capsule  . glimepiride (AMARYL) 2 MG tablet  . HYDROcodone-acetaminophen (NORCO) 10-325 MG tablet  . liraglutide (VICTOZA) 18 MG/3ML SOPN  . losartan (COZAAR) 100 MG tablet  . metFORMIN (GLUCOPHAGE) 500 MG tablet  . Multiple Vitamin (MULTIVITAMIN WITH MINERALS) TABS tablet  . naproxen sodium (ALEVE) 220 MG tablet   No current facility-administered medications for this encounter.   (Clomid is prescribed by endocrinology due to low T and fatigue.)   George Hugh [facility] Short Stay Center/Anesthesiology Phone [phone] 07/08/2018  12:26 PM

## 2018-07-13 ENCOUNTER — Ambulatory Visit (HOSPITAL_COMMUNITY)
Admission: RE | Admit: 2018-07-13 | Discharge: 2018-07-13 | Disposition: A | Discharge status: Home / Self Care | Payer: PRIVATE HEALTH INSURANCE | Source: Ambulatory Visit | Attending: Nurse Practitioner | Admitting: Nurse Practitioner

## 2018-07-13 DIAGNOSIS — I4819 Other persistent atrial fibrillation: Secondary | ICD-10-CM

## 2018-07-13 DIAGNOSIS — I340 Nonrheumatic mitral (valve) insufficiency: Secondary | ICD-10-CM | POA: Insufficient documentation

## 2018-07-13 DIAGNOSIS — I481 Persistent atrial fibrillation: Secondary | ICD-10-CM

## 2018-07-13 DIAGNOSIS — Z6838 Body mass index (BMI) 38.0-38.9, adult: Secondary | ICD-10-CM | POA: Diagnosis not present

## 2018-07-13 DIAGNOSIS — M5124 Other intervertebral disc displacement, thoracic region: Secondary | ICD-10-CM | POA: Diagnosis not present

## 2018-07-13 DIAGNOSIS — I10 Essential (primary) hypertension: Secondary | ICD-10-CM | POA: Diagnosis not present

## 2018-07-13 DIAGNOSIS — M961 Postlaminectomy syndrome, not elsewhere classified: Secondary | ICD-10-CM | POA: Diagnosis not present

## 2018-07-13 NOTE — Progress Notes (Signed)
  Echocardiogram 2D Echocardiogram has been performed.  Johny Chess 07/13/2018, 8:57 AM

## 2018-07-16 ENCOUNTER — Ambulatory Visit (HOSPITAL_COMMUNITY)
Admission: RE | Admit: 2018-07-16 | Discharge: 2018-07-16 | Disposition: A | Discharge status: Home / Self Care | Payer: PRIVATE HEALTH INSURANCE | Source: Ambulatory Visit | Attending: Nurse Practitioner | Admitting: Nurse Practitioner

## 2018-07-16 ENCOUNTER — Encounter (HOSPITAL_COMMUNITY): Payer: Self-pay | Admitting: Nurse Practitioner

## 2018-07-16 ENCOUNTER — Encounter (HOSPITAL_COMMUNITY): Admission: RE | Payer: Self-pay | Source: Ambulatory Visit

## 2018-07-16 ENCOUNTER — Ambulatory Visit (HOSPITAL_COMMUNITY)
Admission: RE | Admit: 2018-07-16 | Payer: PRIVATE HEALTH INSURANCE | Source: Ambulatory Visit | Admitting: Otolaryngology

## 2018-07-16 VITALS — BP 150/96 | HR 81 | Ht 70.0 in | Wt 267.0 lb

## 2018-07-16 DIAGNOSIS — Z7984 Long term (current) use of oral hypoglycemic drugs: Secondary | ICD-10-CM | POA: Insufficient documentation

## 2018-07-16 DIAGNOSIS — E119 Type 2 diabetes mellitus without complications: Secondary | ICD-10-CM | POA: Insufficient documentation

## 2018-07-16 DIAGNOSIS — I4891 Unspecified atrial fibrillation: Secondary | ICD-10-CM | POA: Diagnosis not present

## 2018-07-16 DIAGNOSIS — Z87891 Personal history of nicotine dependence: Secondary | ICD-10-CM | POA: Insufficient documentation

## 2018-07-16 DIAGNOSIS — G4730 Sleep apnea, unspecified: Secondary | ICD-10-CM | POA: Diagnosis not present

## 2018-07-16 DIAGNOSIS — R931 Abnormal findings on diagnostic imaging of heart and coronary circulation: Secondary | ICD-10-CM

## 2018-07-16 DIAGNOSIS — I10 Essential (primary) hypertension: Secondary | ICD-10-CM | POA: Diagnosis not present

## 2018-07-16 DIAGNOSIS — Z79899 Other long term (current) drug therapy: Secondary | ICD-10-CM | POA: Diagnosis not present

## 2018-07-16 DIAGNOSIS — Z9884 Bariatric surgery status: Secondary | ICD-10-CM | POA: Insufficient documentation

## 2018-07-16 DIAGNOSIS — Z7982 Long term (current) use of aspirin: Secondary | ICD-10-CM | POA: Diagnosis not present

## 2018-07-16 LAB — BASIC METABOLIC PANEL
ANION GAP: 7 (ref 5–15)
BUN: 19 mg/dL (ref 6–20)
CHLORIDE: 104 mmol/L (ref 98–111)
CO2: 27 mmol/L (ref 22–32)
Calcium: 9.2 mg/dL (ref 8.9–10.3)
Creatinine, Ser: 0.97 mg/dL (ref 0.61–1.24)
GFR calc Af Amer: >60 mL/min (ref >60)
GFR calc non Af Amer: >60 mL/min (ref >60)
GLUCOSE: 181 mg/dL — AB (ref 70–99)
POTASSIUM: 4.2 mmol/L (ref 3.5–5.1)
Sodium: 138 mmol/L (ref 135–145)

## 2018-07-16 LAB — CBC
HEMATOCRIT: 51.3 % (ref 39.0–52.0)
Hemoglobin: 16.6 g/dL (ref 13.0–17.0)
MCH: 31 pg (ref 26.0–34.0)
MCHC: 32.4 g/dL (ref 30.0–36.0)
MCV: 95.7 fL (ref 78.0–100.0)
Platelets: 213 10*3/uL (ref 150–400)
RBC: 5.36 MIL/uL (ref 4.22–5.81)
RDW: 13.4 % (ref 11.5–15.5)
WBC: 7.9 10*3/uL (ref 4.0–10.5)

## 2018-07-16 SURGERY — EXCISION, PAROTID GLAND
Anesthesia: General | Laterality: Left

## 2018-07-16 NOTE — Progress Notes (Signed)
Primary Care Physician: Orpah Melter, MD Referring Physician: Dr. Fransico Him   Casey [name]. is a 62 y.o. male with a h/o obesity, s/p gastric sleeve, HTN, DM that presented for pre op for rt parotidectomy 8/8 and was found to be in rate controlled afib, from which he was asymptomatic. He was sent to the afib clinic for evaluation. 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 He has felt some fatigue, less stamina for several months. Was just seen by PCP yesterday and nothing out of the ordinary was noted with his heart rhythm. He denies any exertional chest pain.   He does not drink alcohol, no tobacco use,no excessive caffeine. He has kept his weight stable for several years. Was almost 100 lbs heavier before gastric sleeve.  I discussed with Dr. Bubba Hales he recommended an echo and if ok, he would be considered at low risk for surgery and could start anticoagulation after surgery. However, pt is now back in the office as his echo did show a reduced EF at 30-35%. Dr. Rayann Heman discussed with Dr. Redmond Baseman and it is felt most appropriate  to delay surgery in order to obtain  LHC to further determine his risk for surgery. He does have cardiac risk factors for CAD , ie , obesity,  HTN, DM. He denies any exertional chest pain but c/o fatigue/ low stamina  for several months.  Today, he denies symptoms of palpitations, chest pain, shortness of breath, orthopnea, PND, lower extremity edema, dizziness, presyncope, syncope, or neurologic sequela.+ for fatigue. The patient is tolerating medications without difficulties and is otherwise without complaint today.   Past Medical History:  Diagnosis Date  . Arthritis   . Complication of anesthesia    diff. waking up  . Diabetes mellitus   . Headache(784.0)   . Hypertension    dr Marisue Humble   pcp  . Sleep apnea    prior to weight lose surgery-Dr. Don Perking not use CPAP   Past Surgical History:  Procedure Laterality Date  . BACK SURGERY     5  back surgeries  . CARPAL TUNNEL RELEASE     bil  . COLON SURGERY     2004 for colon rupture  . HERNIA REPAIR    . LAPAROSCOPIC GASTRIC BANDING    . MANDIBLE FRACTURE SURGERY     x 2  . TONSILLECTOMY      Current Outpatient Medications  Medication Sig Dispense Refill  . aspirin 81 MG EC tablet Take 81 mg by mouth at bedtime. Swallow whole.     [name] atenolol (TENORMIN) 100 MG tablet Take 100 mg by mouth daily.  5  . clomiPHENE (CLOMID) 50 MG tablet Take 25 mg by mouth 3 (three) times a week.    . docusate sodium (COLACE) 100 MG capsule Take 200 mg by mouth daily.    [name] glimepiride (AMARYL) 2 MG tablet Take 2 mg by mouth daily with breakfast.    . HYDROcodone-acetaminophen (NORCO) 10-325 MG tablet Take 1 tablet by mouth every 4 (four) hours as needed (for severe back pain.).    [name] liraglutide (VICTOZA) 18 MG/3ML SOPN Inject 1.8 mg into the skin every evening.     [name] losartan (COZAAR) 100 MG tablet Take 100 mg by mouth daily.    . metFORMIN (GLUCOPHAGE) 500 MG tablet Take 1,000 mg by mouth 2 (two) times daily.    [name]  Multiple Vitamin (MULTIVITAMIN WITH MINERALS) TABS tablet Take 1 tablet by mouth daily.    . naproxen sodium (ALEVE) 220 MG tablet Take 220-440 mg by mouth 2 (two) times daily as needed (for pain).     No current facility-administered medications for this encounter.     Allergies  Allergen Reactions  . Penicillins Hives    Has patient had a PCN reaction causing immediate rash, facial/tongue/throat swelling, SOB or lightheadedness with hypotension: Unknown Has patient had a PCN reaction causing severe rash involving mucus membranes or skin necrosis: Unknown Has patient had a PCN reaction that required hospitalization: No Has patient had a PCN reaction occurring within the last 10 years: No Childhood reaction. If all of the above answers are "NO", then may proceed with  Cephalosporin use.   [name] Percocet [Oxycodone-Acetaminophen] Anxiety    Hyperactivity & unhinged.    Social History   Socioeconomic History  . Marital status: Married    Spouse name: Not on file  . Number of children: Not on file  . Years of education: Not on file  . Highest education level: Not on file  Occupational History  . Not on file  Social Needs  . Financial resource strain: Not on file  . Food insecurity:    Worry: Not on file    Inability: Not on file  . Transportation needs:    Medical: Not on file    Non-medical: Not on file  Tobacco Use  . Smoking status: Former Smoker    Packs/day: 0.90    Years: 15.00    Pack years: 13.50    Types: Cigarettes    Last attempt to quit: 01/19/2006    Years since quitting: 12.4  . Smokeless tobacco: Never Used  Substance and Sexual Activity  . Alcohol use: No  . Drug use: No  . Sexual activity: Not on file  Lifestyle  . Physical activity:    Days per week: Not on file    Minutes per session: Not on file  . Stress: Not on file  Relationships  . Social connections:    Talks on phone: Not on file    Gets together: Not on file    Attends religious service: Not on file    Active member of club or organization: Not on file    Attends meetings of clubs or organizations: Not on file    Relationship status: Not on file  . Intimate partner violence:    Fear of current or ex partner: Not on file    Emotionally abused: Not on file    Physically abused: Not on file    Forced sexual activity: Not on file  Other Topics Concern  . Not on file  Social History Narrative  . Not on file    Family History  Problem Relation Age of Onset  . Diabetes Maternal Grandfather   . Heart disease Neg Hx     ROS- All systems are reviewed and negative except as per the HPI above  Physical Exam: Vitals:   07/16/18 0920  BP: (!) 150/96  Pulse: 81  Weight: 121.1 kg  Height: 5\' 10"  (1.778 m)   Wt Readings from Last 3 Encounters:    07/16/18 121.1 kg  07/08/18 122.9 kg  07/08/18 122.5 kg    Labs: Lab Results  Component Value Date   NA 138 07/16/2018   K 4.2 07/16/2018   CL 104 07/16/2018   CO2 27 07/16/2018   GLUCOSE 181 (H) 07/16/2018  BUN 19 07/16/2018   CREATININE 0.97 07/16/2018   CALCIUM 9.2 07/16/2018   No results found for: INR No results found for: CHOL, HDL, LDLCALC, TRIG   GEN- The patient is well appearing, alert and oriented x 3 today.   Head- normocephalic, atraumatic Eyes-  Sclera clear, conjunctiva pink Ears- hearing intact Oropharynx- clear Neck- supple, no JVP Lymph- no cervical lymphadenopathy Lungs- Clear to ausculation bilaterally, normal work of breathing Heart- irregular rate and rhythm, no murmurs, rubs or gallops, PMI not laterally displaced GI- soft, NT, ND, + BS Extremities- no clubbing, cyanosis, or edema MS- no significant deformity or atrophy Skin- no rash or lesion Psych- euthymic mood, full affect Neuro- strength and sensation are intact  EKG- afib at 81 bpm, qrs int 96 ms, qtc 459ms Epic records reviewed Labs for 8/8 reviewed and wnl Echo-Study Conclusions  - Left ventricle: The cavity size was mildly dilated. Wall   thickness was increased in a pattern of mild LVH. Systolic   function was moderately to severely reduced. The estimated   ejection fraction was in the range of 30% to 35%. Diffuse   hypokinesis. - Aortic valve: There was trivial regurgitation. - Mitral valve: There was mild regurgitation.  Impressions:  - Moderate to severe global reduction in LV systolic function; mild   LVE; mild LVH; trace AI; mild MR.   Assessment and Plan: 1. Afib Hard at this point to know if paroxysmal or persistent but pt reports feeling fatigued for a couple of months Otherwise, seems to be tolerating ok Does not appear to have fluid weight gain, LEE, PND/orthopnea 2/2 afib Is rate controlled today, will continue atenolol 100 mg daily Echo showed mod to  severe LV dysfunction and on the recommendation of Dr. Rayann Heman will schedule pt for LHC This was discussed in detail with the patient  and his wife today and they want to proceed The outcome of the cath will determine when pt can have surgery, Dr. Rayann Heman is hopeful that if he is found to have a high risk lesion, pt may be able to proceed to surgery after 3 months of antiplatelet therapy IF a low-mod risk lesion is found, he feels that he may be able to go ahead with surgery and then plan elective intervention for stent placement at a later date There is also a possibility that pt may have been in afib for some period of time and this may have  contributed to a TMC, explaining LV dysfunction Pre procedure labs drawn  2.CHA2DS2VASc score of 2(htn, DM)  Dr. Rayann Heman advised to wait until after cath/ surgery to start [place] On hold for now until this can be figured out He will stop ASA for [facility]  LHC is scheduled with Dr. Tamala Julian Wednesday, 8/21   Geroge Baseman. Suezette Lafave, [facility] [address] [place], [place] [address] [phone]

## 2018-07-16 NOTE — H&P (View-Only) (Signed)
Primary Care Physician: Orpah Melter, MD Referring Physician: Dr. Fransico Him   Casey [name]. is a 62 y.o. male with a h/o obesity, s/p gastric sleeve, HTN, DM that presented for pre op for rt parotidectomy 8/8 and was found to be in rate controlled afib, from which he was asymptomatic. He was sent to the afib clinic for evaluation. 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 He has felt some fatigue, less stamina for several months. Was just seen by PCP yesterday and nothing out of the ordinary was noted with his heart rhythm. He denies any exertional chest pain.   He does not drink alcohol, no tobacco use,no excessive caffeine. He has kept his weight stable for several years. Was almost 100 lbs heavier before gastric sleeve.  I discussed with Dr. Bubba Hales he recommended an echo and if ok, he would be considered at low risk for surgery and could start anticoagulation after surgery. However, pt is now back in the office as his echo did show a reduced EF at 30-35%. Dr. Rayann Heman discussed with Dr. Redmond Baseman and it is felt most appropriate  to delay surgery in order to obtain  LHC to further determine his risk for surgery. He does have cardiac risk factors for CAD , ie , obesity,  HTN, DM. He denies any exertional chest pain but c/o fatigue/ low stamina  for several months.  Today, he denies symptoms of palpitations, chest pain, shortness of breath, orthopnea, PND, lower extremity edema, dizziness, presyncope, syncope, or neurologic sequela.+ for fatigue. The patient is tolerating medications without difficulties and is otherwise without complaint today.   Past Medical History:  Diagnosis Date  . Arthritis   . Complication of anesthesia    diff. waking up  . Diabetes mellitus   . Headache(784.0)   . Hypertension    dr Marisue Humble   pcp  . Sleep apnea    prior to weight lose surgery-Dr. Don Perking not use CPAP   Past Surgical History:  Procedure Laterality Date  . BACK SURGERY     5  back surgeries  . CARPAL TUNNEL RELEASE     bil  . COLON SURGERY     2004 for colon rupture  . HERNIA REPAIR    . LAPAROSCOPIC GASTRIC BANDING    . MANDIBLE FRACTURE SURGERY     x 2  . TONSILLECTOMY      Current Outpatient Medications  Medication Sig Dispense Refill  . aspirin 81 MG EC tablet Take 81 mg by mouth at bedtime. Swallow whole.     [name] atenolol (TENORMIN) 100 MG tablet Take 100 mg by mouth daily.  5  . clomiPHENE (CLOMID) 50 MG tablet Take 25 mg by mouth 3 (three) times a week.    . docusate sodium (COLACE) 100 MG capsule Take 200 mg by mouth daily.    [name] glimepiride (AMARYL) 2 MG tablet Take 2 mg by mouth daily with breakfast.    . HYDROcodone-acetaminophen (NORCO) 10-325 MG tablet Take 1 tablet by mouth every 4 (four) hours as needed (for severe back pain.).    [name] liraglutide (VICTOZA) 18 MG/3ML SOPN Inject 1.8 mg into the skin every evening.     [name] losartan (COZAAR) 100 MG tablet Take 100 mg by mouth daily.    . metFORMIN (GLUCOPHAGE) 500 MG tablet Take 1,000 mg by mouth 2 (two) times daily.    [name]  Multiple Vitamin (MULTIVITAMIN WITH MINERALS) TABS tablet Take 1 tablet by mouth daily.    . naproxen sodium (ALEVE) 220 MG tablet Take 220-440 mg by mouth 2 (two) times daily as needed (for pain).     No current facility-administered medications for this encounter.     Allergies  Allergen Reactions  . Penicillins Hives    Has patient had a PCN reaction causing immediate rash, facial/tongue/throat swelling, SOB or lightheadedness with hypotension: Unknown Has patient had a PCN reaction causing severe rash involving mucus membranes or skin necrosis: Unknown Has patient had a PCN reaction that required hospitalization: No Has patient had a PCN reaction occurring within the last 10 years: No Childhood reaction. If all of the above answers are "NO", then may proceed with  Cephalosporin use.   [name] Percocet [Oxycodone-Acetaminophen] Anxiety    Hyperactivity & unhinged.    Social History   Socioeconomic History  . Marital status: Married    Spouse name: Not on file  . Number of children: Not on file  . Years of education: Not on file  . Highest education level: Not on file  Occupational History  . Not on file  Social Needs  . Financial resource strain: Not on file  . Food insecurity:    Worry: Not on file    Inability: Not on file  . Transportation needs:    Medical: Not on file    Non-medical: Not on file  Tobacco Use  . Smoking status: Former Smoker    Packs/day: 0.90    Years: 15.00    Pack years: 13.50    Types: Cigarettes    Last attempt to quit: 01/19/2006    Years since quitting: 12.4  . Smokeless tobacco: Never Used  Substance and Sexual Activity  . Alcohol use: No  . Drug use: No  . Sexual activity: Not on file  Lifestyle  . Physical activity:    Days per week: Not on file    Minutes per session: Not on file  . Stress: Not on file  Relationships  . Social connections:    Talks on phone: Not on file    Gets together: Not on file    Attends religious service: Not on file    Active member of club or organization: Not on file    Attends meetings of clubs or organizations: Not on file    Relationship status: Not on file  . Intimate partner violence:    Fear of current or ex partner: Not on file    Emotionally abused: Not on file    Physically abused: Not on file    Forced sexual activity: Not on file  Other Topics Concern  . Not on file  Social History Narrative  . Not on file    Family History  Problem Relation Age of Onset  . Diabetes Maternal Grandfather   . Heart disease Neg Hx     ROS- All systems are reviewed and negative except as per the HPI above  Physical Exam: Vitals:   07/16/18 0920  BP: (!) 150/96  Pulse: 81  Weight: 121.1 kg  Height: 5\' 10"  (1.778 m)   Wt Readings from Last 3 Encounters:    07/16/18 121.1 kg  07/08/18 122.9 kg  07/08/18 122.5 kg    Labs: Lab Results  Component Value Date   NA 138 07/16/2018   K 4.2 07/16/2018   CL 104 07/16/2018   CO2 27 07/16/2018   GLUCOSE 181 (H) 07/16/2018  BUN 19 07/16/2018   CREATININE 0.97 07/16/2018   CALCIUM 9.2 07/16/2018   No results found for: INR No results found for: CHOL, HDL, LDLCALC, TRIG   GEN- The patient is well appearing, alert and oriented x 3 today.   Head- normocephalic, atraumatic Eyes-  Sclera clear, conjunctiva pink Ears- hearing intact Oropharynx- clear Neck- supple, no JVP Lymph- no cervical lymphadenopathy Lungs- Clear to ausculation bilaterally, normal work of breathing Heart- irregular rate and rhythm, no murmurs, rubs or gallops, PMI not laterally displaced GI- soft, NT, ND, + BS Extremities- no clubbing, cyanosis, or edema MS- no significant deformity or atrophy Skin- no rash or lesion Psych- euthymic mood, full affect Neuro- strength and sensation are intact  EKG- afib at 81 bpm, qrs int 96 ms, qtc 470ms Epic records reviewed Labs for 8/8 reviewed and wnl Echo-Study Conclusions  - Left ventricle: The cavity size was mildly dilated. Wall   thickness was increased in a pattern of mild LVH. Systolic   function was moderately to severely reduced. The estimated   ejection fraction was in the range of 30% to 35%. Diffuse   hypokinesis. - Aortic valve: There was trivial regurgitation. - Mitral valve: There was mild regurgitation.  Impressions:  - Moderate to severe global reduction in LV systolic function; mild   LVE; mild LVH; trace AI; mild MR.   Assessment and Plan: 1. Afib Hard at this point to know if paroxysmal or persistent but pt reports feeling fatigued for a couple of months Otherwise, seems to be tolerating ok Does not appear to have fluid weight gain, LEE, PND/orthopnea 2/2 afib Is rate controlled today, will continue atenolol 100 mg daily Echo showed mod to  severe LV dysfunction and on the recommendation of Dr. Rayann Heman will schedule pt for LHC This was discussed in detail with the patient  and his wife today and they want to proceed The outcome of the cath will determine when pt can have surgery, Dr. Rayann Heman is hopeful that if he is found to have a high risk lesion, pt may be able to proceed to surgery after 3 months of antiplatelet therapy IF a low-mod risk lesion is found, he feels that he may be able to go ahead with surgery and then plan elective intervention for stent placement at a later date There is also a possibility that pt may have been in afib for some period of time and this may have  contributed to a TMC, explaining LV dysfunction Pre procedure labs drawn  2.CHA2DS2VASc score of 2(htn, DM)  Dr. Rayann Heman advised to wait until after cath/ surgery to start [place] On hold for now until this can be figured out He will stop ASA for [facility]  LHC is scheduled with Dr. Tamala Julian Wednesday, 8/21   Casey [name], [facility] [address] [place], [place] [address] [phone]

## 2018-07-16 NOTE — Patient Instructions (Addendum)
[facility] OFFICE  [address], [address]  [place] [address]  Dept: [place]: [phone]  07/21/2017  You are scheduled for a Cardiac Catheterization on Wednesday, August 21 with Dr. Daneen Schick.  1. Please arrive at the [facility] (Main Entrance A) at [facility]: [address] [place], [place] [address] at 6:30 AM (two hours before your procedure to ensure your preparation). Free valet parking service is available.  Special note: Every effort is made to have your procedure done on time. Please understand that emergencies sometimes delay scheduled procedures.  PATIENTS SCHEDULED FOR 10:30 AM PLEASE HAVE ARRIVE @ 8 AM.  2. Diet: Pt should be NPO after midnight.4. Medication instructions in preparation for your procedure:  On the morning of your procedure, take your aspirin and any morning medicines NOT listed above. You may use sips of water.  1. Every patient must take a baby ASA (81 mg ASA) before a catheterization. This is required of every patient, whether they normally take an ASA or not.   4. METFORMIN interacts with the contrast material used during a catheterization and can cause kidney damage. Metformin must be held 24 hours prior to procedure and 48 hours post procedure.  5. Insulin/other diabetic meds: if patient takes Lantus at night, have patient take  the dose the night before procedure. Hold all diabetic medications the morning of procedure.  6. Hold all "fluid pills" including combination pills with HCTZ the morning of procedure unless otherwise instructed. This is for Pt comfort as they will not be able to use the restroom at times during procedure day.  7. Lisinopril may be held depending on ordering physician preference and kidney function.  5. Plan for one night stay--bring personal belongings.  6. Bring a current list of your medications and current insurance cards.  7.  You MUST have a responsible person to drive you home.  8. Someone MUST be with you the first 24 hours after you arrive home or your discharge will be delayed.  9. Please wear clothes that are easy to get on and off and wear slip-on shoes.

## 2018-07-20 ENCOUNTER — Telehealth: Payer: Self-pay | Admitting: *Deleted

## 2018-07-20 NOTE — Telephone Encounter (Signed)
Pt contacted pre-catheterization scheduled at [facility] for: Wednesday August 21,2019 8:30 AM Verified arrival time and place: [place] Entrance A at: 6:30 AM  No solid food after midnight prior to cath, clear liquids until 5 AM day of procedure. Verified allergies in Epic  Hold: Glimepiride -AM of procedure. Metformin -day of procedure and 48 hours post procedure.  Except hold medications AM meds can be  taken pre-cath with sip of water including: ASA 81 mg  Confirmed patient has responsible person to drive home post procedure and for 24 hours after you arrive home.: yes

## 2018-07-21 ENCOUNTER — Encounter (HOSPITAL_COMMUNITY): Payer: Self-pay | Admitting: Interventional Cardiology

## 2018-07-21 ENCOUNTER — Ambulatory Visit (HOSPITAL_COMMUNITY): Payer: PRIVATE HEALTH INSURANCE | Admitting: Nurse Practitioner

## 2018-07-21 ENCOUNTER — Ambulatory Visit (HOSPITAL_COMMUNITY)
Admission: RE | Admit: 2018-07-21 | Discharge: 2018-07-21 | Disposition: A | Discharge status: Home / Self Care | Payer: PRIVATE HEALTH INSURANCE | Source: Ambulatory Visit | Attending: Interventional Cardiology | Admitting: Interventional Cardiology

## 2018-07-21 ENCOUNTER — Encounter (HOSPITAL_COMMUNITY)
Admission: RE | Disposition: A | Discharge status: Home / Self Care | Payer: Self-pay | Source: Ambulatory Visit | Attending: Interventional Cardiology

## 2018-07-21 DIAGNOSIS — I2582 Chronic total occlusion of coronary artery: Secondary | ICD-10-CM | POA: Diagnosis not present

## 2018-07-21 DIAGNOSIS — Z9884 Bariatric surgery status: Secondary | ICD-10-CM | POA: Diagnosis not present

## 2018-07-21 DIAGNOSIS — I481 Persistent atrial fibrillation: Secondary | ICD-10-CM | POA: Diagnosis not present

## 2018-07-21 DIAGNOSIS — Z7984 Long term (current) use of oral hypoglycemic drugs: Secondary | ICD-10-CM | POA: Diagnosis not present

## 2018-07-21 DIAGNOSIS — Z833 Family history of diabetes mellitus: Secondary | ICD-10-CM | POA: Insufficient documentation

## 2018-07-21 DIAGNOSIS — Z79899 Other long term (current) drug therapy: Secondary | ICD-10-CM | POA: Insufficient documentation

## 2018-07-21 DIAGNOSIS — Z7982 Long term (current) use of aspirin: Secondary | ICD-10-CM | POA: Diagnosis not present

## 2018-07-21 DIAGNOSIS — E119 Type 2 diabetes mellitus without complications: Secondary | ICD-10-CM | POA: Insufficient documentation

## 2018-07-21 DIAGNOSIS — M1990 Unspecified osteoarthritis, unspecified site: Secondary | ICD-10-CM | POA: Diagnosis not present

## 2018-07-21 DIAGNOSIS — I4819 Other persistent atrial fibrillation: Secondary | ICD-10-CM

## 2018-07-21 DIAGNOSIS — I110 Hypertensive heart disease with heart failure: Secondary | ICD-10-CM | POA: Diagnosis not present

## 2018-07-21 DIAGNOSIS — I2510 Atherosclerotic heart disease of native coronary artery without angina pectoris: Secondary | ICD-10-CM | POA: Insufficient documentation

## 2018-07-21 DIAGNOSIS — Z885 Allergy status to narcotic agent status: Secondary | ICD-10-CM | POA: Insufficient documentation

## 2018-07-21 DIAGNOSIS — E669 Obesity, unspecified: Secondary | ICD-10-CM | POA: Insufficient documentation

## 2018-07-21 DIAGNOSIS — I25118 Atherosclerotic heart disease of native coronary artery with other forms of angina pectoris: Secondary | ICD-10-CM

## 2018-07-21 DIAGNOSIS — G4730 Sleep apnea, unspecified: Secondary | ICD-10-CM | POA: Insufficient documentation

## 2018-07-21 DIAGNOSIS — Z6838 Body mass index (BMI) 38.0-38.9, adult: Secondary | ICD-10-CM | POA: Insufficient documentation

## 2018-07-21 DIAGNOSIS — I5023 Acute on chronic systolic (congestive) heart failure: Secondary | ICD-10-CM | POA: Diagnosis not present

## 2018-07-21 DIAGNOSIS — E1165 Type 2 diabetes mellitus with hyperglycemia: Secondary | ICD-10-CM | POA: Diagnosis present

## 2018-07-21 DIAGNOSIS — Z9889 Other specified postprocedural states: Secondary | ICD-10-CM | POA: Insufficient documentation

## 2018-07-21 DIAGNOSIS — Z880 Allergy status to penicillin: Secondary | ICD-10-CM | POA: Diagnosis not present

## 2018-07-21 DIAGNOSIS — Z87891 Personal history of nicotine dependence: Secondary | ICD-10-CM | POA: Diagnosis not present

## 2018-07-21 DIAGNOSIS — I5022 Chronic systolic (congestive) heart failure: Secondary | ICD-10-CM

## 2018-07-21 HISTORY — PX: LEFT HEART CATH AND CORONARY ANGIOGRAPHY: CATH118249

## 2018-07-21 LAB — GLUCOSE, CAPILLARY: Glucose-Capillary: 156 mg/dL — ABNORMAL HIGH (ref 70–99)

## 2018-07-21 SURGERY — LEFT HEART CATH AND CORONARY ANGIOGRAPHY
Anesthesia: LOCAL

## 2018-07-21 MED ORDER — ONDANSETRON HCL 4 MG/2ML IJ SOLN: 4.0000 mg | Freq: Four times a day (QID) | INTRAMUSCULAR | Status: DC | PRN

## 2018-07-21 MED ORDER — VERAPAMIL HCL 2.5 MG/ML IV SOLN
INTRAVENOUS | Status: DC | PRN
  Administered 2018-07-21: 10 mL via INTRA_ARTERIAL

## 2018-07-21 MED ORDER — SODIUM CHLORIDE 0.9% FLUSH: 3.0000 mL | INTRAVENOUS | Status: DC | PRN

## 2018-07-21 MED ORDER — LIDOCAINE HCL (PF) 1 % IJ SOLN
INTRAMUSCULAR | Status: DC | PRN
  Administered 2018-07-21: 2 mL

## 2018-07-21 MED ORDER — SODIUM CHLORIDE 0.9 % WEIGHT BASED INFUSION
3.0000 mL/kg/h | INTRAVENOUS | Status: AC
  Administered 2018-07-21: 3 mL/kg/h via INTRAVENOUS

## 2018-07-21 MED ORDER — ASPIRIN 81 MG PO CHEW: 81.0000 mg | CHEWABLE_TABLET | ORAL | Status: DC

## 2018-07-21 MED ORDER — SODIUM CHLORIDE 0.9 % WEIGHT BASED INFUSION: 1.0000 mL/kg/h | INTRAVENOUS | Status: DC

## 2018-07-21 MED ORDER — HEPARIN SODIUM (PORCINE) 1000 UNIT/ML IJ SOLN
INTRAMUSCULAR | Status: AC
  Filled 2018-07-21: qty 1

## 2018-07-21 MED ORDER — ACETAMINOPHEN 325 MG PO TABS: 650.0000 mg | ORAL_TABLET | ORAL | Status: DC | PRN

## 2018-07-21 MED ORDER — MIDAZOLAM HCL 2 MG/2ML IJ SOLN
INTRAMUSCULAR | Status: AC
  Filled 2018-07-21: qty 2

## 2018-07-21 MED ORDER — SODIUM CHLORIDE 0.9 % IV SOLN: INTRAVENOUS | Status: DC

## 2018-07-21 MED ORDER — HEPARIN (PORCINE) IN NACL 1000-0.9 UT/500ML-% IV SOLN
INTRAVENOUS | Status: AC
  Filled 2018-07-21: qty 1000

## 2018-07-21 MED ORDER — VERAPAMIL HCL 2.5 MG/ML IV SOLN
INTRAVENOUS | Status: AC
  Filled 2018-07-21: qty 2

## 2018-07-21 MED ORDER — IOHEXOL 350 MG/ML SOLN
INTRAVENOUS | Status: DC | PRN
  Administered 2018-07-21: 95 mL

## 2018-07-21 MED ORDER — HEPARIN SODIUM (PORCINE) 1000 UNIT/ML IJ SOLN
INTRAMUSCULAR | Status: DC | PRN
  Administered 2018-07-21: 5000 [IU] via INTRAVENOUS

## 2018-07-21 MED ORDER — SODIUM CHLORIDE 0.9 % IV SOLN: 250.0000 mL | INTRAVENOUS | Status: DC | PRN

## 2018-07-21 MED ORDER — LIDOCAINE HCL (PF) 1 % IJ SOLN
INTRAMUSCULAR | Status: AC
  Filled 2018-07-21: qty 30

## 2018-07-21 MED ORDER — FENTANYL CITRATE (PF) 100 MCG/2ML IJ SOLN
INTRAMUSCULAR | Status: AC
  Filled 2018-07-21: qty 2

## 2018-07-21 MED ORDER — SODIUM CHLORIDE 0.9% FLUSH: 3.0000 mL | Freq: Two times a day (BID) | INTRAVENOUS | Status: DC

## 2018-07-21 MED ORDER — HEPARIN (PORCINE) IN NACL 1000-0.9 UT/500ML-% IV SOLN
INTRAVENOUS | Status: DC | PRN
  Administered 2018-07-21 (×2): 500 mL

## 2018-07-21 MED ORDER — MIDAZOLAM HCL 2 MG/2ML IJ SOLN
INTRAMUSCULAR | Status: DC | PRN
  Administered 2018-07-21: 1 mg via INTRAVENOUS

## 2018-07-21 MED ORDER — FENTANYL CITRATE (PF) 100 MCG/2ML IJ SOLN
INTRAMUSCULAR | Status: DC | PRN
  Administered 2018-07-21: 25 ug via INTRAVENOUS
  Administered 2018-07-21: 50 ug via INTRAVENOUS

## 2018-07-21 SURGICAL SUPPLY — 11 items
CATH INFINITI 5 FR JL3.5 (CATHETERS) ×1 IMPLANT
CATH INFINITI JR4 5F (CATHETERS) ×1 IMPLANT
DEVICE RAD COMP TR BAND LRG (VASCULAR PRODUCTS) ×1 IMPLANT
GLIDESHEATH SLEND A-KIT 6F 22G (SHEATH) ×1 IMPLANT
GUIDEWIRE INQWIRE 1.5J.035X260 (WIRE) IMPLANT
INQWIRE 1.5J .035X260CM (WIRE) ×2
KIT HEART LEFT (KITS) ×2 IMPLANT
PACK CARDIAC CATHETERIZATION (CUSTOM PROCEDURE TRAY) ×2 IMPLANT
SHEATH PROBE COVER 6X72 (BAG) ×1 IMPLANT
TRANSDUCER W/STOPCOCK (MISCELLANEOUS) ×2 IMPLANT
TUBING CIL FLEX 10 FLL-RA (TUBING) ×2 IMPLANT

## 2018-07-21 NOTE — Interval H&P Note (Signed)
Cath Lab Visit (complete for each Cath Lab visit)  Clinical Evaluation Leading to the Procedure:   ACS: No.  Non-ACS:    Anginal Classification: CCS III  Anti-ischemic medical therapy: Minimal Therapy (1 class of medications)  Non-Invasive Test Results: High-risk stress test findings: cardiac mortality >3%/year  Prior CABG: No previous CABG      History and Physical Interval Note:  07/21/2018 7:32 AM  Jayvier D Jamse Arn.  has presented today for surgery, with the diagnosis of ischemic workup low ef  The various methods of treatment have been discussed with the patient and family. After consideration of risks, benefits and other options for treatment, the patient has consented to  Procedure(s): LEFT HEART CATH AND CORONARY ANGIOGRAPHY (N/A) as a surgical intervention .  The patient's history has been reviewed, patient examined, no change in status, stable for surgery.  I have reviewed the patient's chart and labs.  Questions were answered to the patient's satisfaction.     Belva Crome III

## 2018-07-21 NOTE — Discharge Instructions (Signed)

## 2018-07-22 ENCOUNTER — Other Ambulatory Visit (HOSPITAL_COMMUNITY): Payer: Self-pay | Admitting: *Deleted

## 2018-07-22 ENCOUNTER — Telehealth: Payer: Self-pay | Admitting: *Deleted

## 2018-07-22 MED ORDER — APIXABAN 5 MG PO TABS: 5.0000 mg | ORAL_TABLET | Freq: Two times a day (BID) | ORAL | 0 refills | Status: DC

## 2018-07-22 MED ORDER — SACUBITRIL-VALSARTAN 24-26 MG PO TABS: 1.0000 | ORAL_TABLET | Freq: Two times a day (BID) | ORAL | 11 refills | Status: DC

## 2018-07-22 NOTE — Telephone Encounter (Signed)
Pt states that [employer] office called earlier today and they have now started him on Eliquis 5mg  BID.

## 2018-07-22 NOTE — Telephone Encounter (Signed)
-----   Message from Belva Crome, MD sent at 07/21/2018  6:53 PM EDT ----- Regarding: New patient He should stop Losartan.  Start Entresto 24/26 mg BID 24 hours later.  Please set up OP with me next week when I am a rounder. Also have a BMET done then as well.  When I see him then we ill likely change atenolol to carvedilol.  Ask him if Allred/Donna Kayleen Memos have started Eliquis?  HS

## 2018-07-22 NOTE — Telephone Encounter (Signed)
Spoke with pt and went over recommendations per Dr. Tamala Julian.  Pt verbalized understanding and was in agreement with this plan.  Pt aware to not start Entresto until Saturday morning.  Pt will come Tuesday for appt.

## 2018-07-23 ENCOUNTER — Telehealth: Payer: Self-pay | Admitting: Interventional Cardiology

## 2018-07-23 NOTE — Telephone Encounter (Signed)
Follow up:    Patient haws more question about his medicatiion

## 2018-07-23 NOTE — Telephone Encounter (Signed)
Spoke with pt and he states that he went to pharmacy and they said Casey [name] would be $100.  Called pharmacy and they needed ID number to make 30 day free card work.  Provided number and they were able to get that to go through.  Spoke with pt and made him aware.  Pt appreciative for assistance.

## 2018-07-23 NOTE — Progress Notes (Signed)
Pt in to pick up eliquis  5 mg bid 30 day free rx to get started on anticoagulation with a chadsvasc score of 3. Pt wants to delay parotid surgery until he gets the afib and his CAD addressed. He is seeing Dr. Pernell Dupre next Tuesday to get started on medical management for  LV dysfunction, multivessel disease by recent cath. He will ask Dr. Tamala Julian if he wants him to continue the ASA 81 mg daily.   I advised no use of antiinflammatories with eliquis, and discussed bleeding precautions. I will see back in 2 weeks when we will discuss getting scheduled for cardioversion.

## 2018-07-27 ENCOUNTER — Ambulatory Visit (INDEPENDENT_AMBULATORY_CARE_PROVIDER_SITE_OTHER): Payer: PRIVATE HEALTH INSURANCE | Admitting: Interventional Cardiology

## 2018-07-27 ENCOUNTER — Encounter: Payer: Self-pay | Admitting: Interventional Cardiology

## 2018-07-27 VITALS — BP 120/84 | HR 90 | Ht 70.0 in | Wt 266.0 lb

## 2018-07-27 DIAGNOSIS — M48062 Spinal stenosis, lumbar region with neurogenic claudication: Secondary | ICD-10-CM | POA: Diagnosis not present

## 2018-07-27 DIAGNOSIS — Z7901 Long term (current) use of anticoagulants: Secondary | ICD-10-CM | POA: Diagnosis not present

## 2018-07-27 DIAGNOSIS — I5022 Chronic systolic (congestive) heart failure: Secondary | ICD-10-CM | POA: Diagnosis not present

## 2018-07-27 DIAGNOSIS — I481 Persistent atrial fibrillation: Secondary | ICD-10-CM

## 2018-07-27 DIAGNOSIS — I4819 Other persistent atrial fibrillation: Secondary | ICD-10-CM

## 2018-07-27 MED ORDER — CARVEDILOL 25 MG PO TABS: 12.5000 mg | ORAL_TABLET | Freq: Two times a day (BID) | ORAL | 3 refills | Status: DC

## 2018-07-27 NOTE — Progress Notes (Signed)
Cardiology Office Note:    Date:  07/27/2018   ID:  Casey Garbe., DOB March 10, 1956, MRN [id number]  PCP:  Orpah Melter, MD  Cardiologist:  No primary care provider on file.   Referring MD: Orpah Melter, MD   Chief Complaint  Patient presents with  . Atrial Fibrillation  . Congestive Heart Failure    History of Present Illness:    Casey D Shirley Bolle. is a 62 y.o. male with a hx of recently diagnosed atrial fibrillation and coincidental finding of systolic left ventricular dysfunction with EF less than 35%.  Recent cardiac catheterization did not reveal significant CAD.  He is here today for medication titration based upon guidelines for systolic dysfunction.  Losartan has been discontinued and Entresto started without complications.  Currently on 24/26 mg twice daily.  Past Medical History:  Diagnosis Date  . Arthritis   . Complication of anesthesia    diff. waking up  . Diabetes mellitus   . Headache(784.0)   . Hypertension    dr Marisue Humble   pcp  . Sleep apnea    prior to weight lose surgery-Dr. Don Perking not use CPAP    Past Surgical History:  Procedure Laterality Date  . BACK SURGERY     5  back surgeries  . CARPAL TUNNEL RELEASE     bil  . COLON SURGERY     2004 for colon rupture  . HERNIA REPAIR    . LAPAROSCOPIC GASTRIC BANDING    . LEFT HEART CATH AND CORONARY ANGIOGRAPHY N/A 07/21/2018   Procedure: LEFT HEART CATH AND CORONARY ANGIOGRAPHY;  Surgeon: Belva Crome, MD;  Location: [place];  Service: Cardiovascular;  Laterality: N/A;  . MANDIBLE FRACTURE SURGERY     x 2  . TONSILLECTOMY      Current Medications: Current Meds  Medication Sig  . apixaban (ELIQUIS) 5 MG TABS tablet Take 1 tablet (5 mg total) by mouth 2 (two) times daily.  [name] aspirin 81 MG EC tablet Take 81 mg by mouth at bedtime. Swallow whole.   . clomiPHENE (CLOMID) 50 MG tablet Take 25 mg by mouth 3 (three) times a week.  . docusate sodium (COLACE) 100 MG  capsule Take 200 mg by mouth daily.  [name] glimepiride (AMARYL) 2 MG tablet Take 2 mg by mouth daily with breakfast.  . HYDROcodone-acetaminophen (NORCO) 10-325 MG tablet Take 1 tablet by mouth every 4 (four) hours as needed (for severe back pain.).  [name] liraglutide (VICTOZA) 18 MG/3ML SOPN Inject 1.8 mg into the skin every evening.   . metFORMIN (GLUCOPHAGE) 500 MG tablet Take 1,000 mg by mouth 2 (two) times daily.  . Multiple Vitamin (MULTIVITAMIN WITH MINERALS) TABS tablet Take 1 tablet by mouth daily.  . sacubitril-valsartan (ENTRESTO) 24-26 MG Take 1 tablet by mouth 2 (two) times daily.  . [DISCONTINUED] atenolol (TENORMIN) 100 MG tablet Take 100 mg by mouth daily.     Allergies:   Penicillins and Percocet [oxycodone-acetaminophen]   Social History   Socioeconomic History  . Marital status: Married    Spouse name: Not on file  . Number of children: Not on file  . Years of education: Not on file  . Highest education level: Not on file  Occupational History  . Not on file  Social Needs  . Financial resource strain: Not on file  . Food insecurity:    Worry: Not on file    Inability: Not on file  . Transportation needs:  Medical: Not on file    Non-medical: Not on file  Tobacco Use  . Smoking status: Former Smoker    Packs/day: 0.90    Years: 15.00    Pack years: 13.50    Types: Cigarettes    Last attempt to quit: 01/19/2006    Years since quitting: 12.5  . Smokeless tobacco: Never Used  Substance and Sexual Activity  . Alcohol use: No  . Drug use: No  . Sexual activity: Not on file  Lifestyle  . Physical activity:    Days per week: Not on file    Minutes per session: Not on file  . Stress: Not on file  Relationships  . Social connections:    Talks on phone: Not on file    Gets together: Not on file    Attends religious service: Not on file    Active member of club or organization: Not on file    Attends meetings of clubs or organizations: Not on file     Relationship status: Not on file  Other Topics Concern  . Not on file  Social History Narrative  . Not on file     Family History: The patient's family history includes Diabetes in his maternal grandfather. There is no history of Heart disease.  ROS:   Please see the history of present illness.    Anxiety, headaches, excessive fatigue, atypical chest discomfort, and palpitations.  All other systems reviewed and are negative.  EKGs/Labs/Other Studies Reviewed:    The following studies were reviewed today: None  EKG:  EKG is repeated ordered today.   Recent Labs: 07/16/2018: BUN 19; Creatinine, Ser 0.97; Hemoglobin 16.6; Platelets 213; Potassium 4.2; Sodium 138  Recent Lipid Panel No results found for: CHOL, TRIG, HDL, CHOLHDL, VLDL, LDLCALC, LDLDIRECT  Physical Exam:    VS:  BP 120/84   Pulse 90   Ht 5\' 10"  (1.778 m)   Wt 266 lb (120.7 kg)   SpO2 97%   BMI 38.17 kg/m     Wt Readings from Last 3 Encounters:  07/27/18 266 lb (120.7 kg)  07/21/18 267 lb (121.1 kg)  07/16/18 267 lb (121.1 kg)     GEN: Obese, well developed in no acute distress HEENT: Normal NECK: No JVD. LYMPHATICS: No lymphadenopathy CARDIAC: IIRR, no murmur, no gallop, no edema. VASCULAR: 2+ bilateral radial pulses.  No bruits. RESPIRATORY:  Clear to auscultation without rales, wheezing or rhonchi  ABDOMEN: Soft, non-tender, non-distended, No pulsatile mass, MUSCULOSKELETAL: No deformity  SKIN: Warm and dry NEUROLOGIC:  Alert and oriented x 3 PSYCHIATRIC:  Normal affect   ASSESSMENT:    1. Chronic systolic heart failure (HCC)   2. Persistent atrial fibrillation ([place])   3. Chronic anticoagulation   4. Lumbar stenosis with neurogenic claudication    PLAN:    In order of problems listed above:  1. Here today for up titration of heart failure therapy.  Entresto 24/26 mg twice daily has been started and is being tolerated.  Continue same dose.  Switch atenolol to carvedilol 12.5 mg p.o. twice  daily starting in a.m. (Tenormin last dose this morning).  We are giving 25 mg tablets as the intended dose will eventually be 25 mg twice daily.  He will currently take 1/2 tablet p.o. twice daily.  He will have a basic metabolic panel performed on September fifth when he goes to the atrial fib clinic. 2. On Eliquis.  2 have consideration of electrical cardioversion/timing when seen in the A. fib  clinic next week. 3. Continue Eliquis indefinitely.  Clinical follow-up with me for heart failure management 4 to 6 weeks.   Medication Adjustments/Labs and Tests Ordered: Current medicines are reviewed at length with the patient today.  Concerns regarding medicines are outlined above.  Orders Placed This Encounter  Procedures  . Basic metabolic panel   Meds ordered this encounter  Medications  . carvedilol (COREG) 25 MG tablet    Sig: Take 0.5 tablets (12.5 mg total) by mouth 2 (two) times daily.    Dispense:  60 tablet    Refill:  3    Patient Instructions  Your physician has recommended you make the following change in your medication:  1) stop atenolol 2) start carvedilol (Coreg) 25 mg tablets--take 1/2 tablet by mouth two times a day  Your physician recommends that you return for lab work (BMET) when you have next appointment with Roderic Palau, NP at [facility]  Your physician recommends that you keep your follow up appointment with Roderic Palau, NP at [facility] clinic  Your physician recommends that you schedule a follow-up appointment in: 4-6 weeks with Dr. Tamala Julian.      Signed, Sinclair Grooms, MD  07/27/2018 12:52 PM    [place]

## 2018-07-27 NOTE — Patient Instructions (Signed)
Your physician has recommended you make the following change in your medication:  1) stop atenolol 2) start carvedilol (Coreg) 25 mg tablets--take 1/2 tablet by mouth two times a day  Your physician recommends that you return for lab work (BMET) when you have next appointment with Roderic Palau, NP at [facility]  Your physician recommends that you keep your follow up appointment with Roderic Palau, NP at [facility] clinic  Your physician recommends that you schedule a follow-up appointment in: 4-6 weeks with Dr. Tamala Julian.

## 2018-08-05 ENCOUNTER — Ambulatory Visit (HOSPITAL_COMMUNITY): Payer: PRIVATE HEALTH INSURANCE | Admitting: Nurse Practitioner

## 2018-08-05 ENCOUNTER — Encounter (HOSPITAL_COMMUNITY): Payer: Self-pay

## 2018-08-05 ENCOUNTER — Encounter (HOSPITAL_COMMUNITY): Payer: Self-pay | Admitting: Nurse Practitioner

## 2018-08-05 ENCOUNTER — Ambulatory Visit (HOSPITAL_COMMUNITY)
Admission: RE | Admit: 2018-08-05 | Discharge: 2018-08-05 | Disposition: A | Discharge status: Home / Self Care | Payer: PRIVATE HEALTH INSURANCE | Source: Ambulatory Visit | Attending: Nurse Practitioner | Admitting: Nurse Practitioner

## 2018-08-05 VITALS — BP 126/86 | HR 100 | Ht 70.0 in | Wt 266.0 lb

## 2018-08-05 DIAGNOSIS — Z7982 Long term (current) use of aspirin: Secondary | ICD-10-CM | POA: Diagnosis not present

## 2018-08-05 DIAGNOSIS — G4730 Sleep apnea, unspecified: Secondary | ICD-10-CM | POA: Insufficient documentation

## 2018-08-05 DIAGNOSIS — Z885 Allergy status to narcotic agent status: Secondary | ICD-10-CM | POA: Diagnosis not present

## 2018-08-05 DIAGNOSIS — I2510 Atherosclerotic heart disease of native coronary artery without angina pectoris: Secondary | ICD-10-CM | POA: Diagnosis not present

## 2018-08-05 DIAGNOSIS — I2582 Chronic total occlusion of coronary artery: Secondary | ICD-10-CM | POA: Diagnosis not present

## 2018-08-05 DIAGNOSIS — Z87891 Personal history of nicotine dependence: Secondary | ICD-10-CM | POA: Diagnosis not present

## 2018-08-05 DIAGNOSIS — Z880 Allergy status to penicillin: Secondary | ICD-10-CM | POA: Diagnosis not present

## 2018-08-05 DIAGNOSIS — M1990 Unspecified osteoarthritis, unspecified site: Secondary | ICD-10-CM | POA: Diagnosis not present

## 2018-08-05 DIAGNOSIS — I4819 Other persistent atrial fibrillation: Secondary | ICD-10-CM

## 2018-08-05 DIAGNOSIS — Z7984 Long term (current) use of oral hypoglycemic drugs: Secondary | ICD-10-CM | POA: Diagnosis not present

## 2018-08-05 DIAGNOSIS — Z833 Family history of diabetes mellitus: Secondary | ICD-10-CM | POA: Insufficient documentation

## 2018-08-05 DIAGNOSIS — Z79891 Long term (current) use of opiate analgesic: Secondary | ICD-10-CM | POA: Insufficient documentation

## 2018-08-05 DIAGNOSIS — I10 Essential (primary) hypertension: Secondary | ICD-10-CM | POA: Diagnosis not present

## 2018-08-05 DIAGNOSIS — E119 Type 2 diabetes mellitus without complications: Secondary | ICD-10-CM | POA: Diagnosis not present

## 2018-08-05 DIAGNOSIS — Z9884 Bariatric surgery status: Secondary | ICD-10-CM | POA: Insufficient documentation

## 2018-08-05 DIAGNOSIS — Z9889 Other specified postprocedural states: Secondary | ICD-10-CM | POA: Diagnosis not present

## 2018-08-05 DIAGNOSIS — Z79899 Other long term (current) drug therapy: Secondary | ICD-10-CM | POA: Insufficient documentation

## 2018-08-05 DIAGNOSIS — Z7901 Long term (current) use of anticoagulants: Secondary | ICD-10-CM | POA: Insufficient documentation

## 2018-08-05 DIAGNOSIS — I481 Persistent atrial fibrillation: Secondary | ICD-10-CM

## 2018-08-05 LAB — BASIC METABOLIC PANEL
Anion gap: 11 (ref 5–15)
BUN: 16 mg/dL (ref 6–20)
CHLORIDE: 107 mmol/L (ref 98–111)
CO2: 22 mmol/L (ref 22–32)
CREATININE: 0.94 mg/dL (ref 0.61–1.24)
Calcium: 9.4 mg/dL (ref 8.9–10.3)
GFR calc Af Amer: >60 mL/min (ref >60)
GFR calc non Af Amer: >60 mL/min (ref >60)
Glucose, Bld: 153 mg/dL — ABNORMAL HIGH (ref 70–99)
POTASSIUM: 4.3 mmol/L (ref 3.5–5.1)
Sodium: 140 mmol/L (ref 135–145)

## 2018-08-05 LAB — CBC
HEMATOCRIT: 52.1 % — AB (ref 39.0–52.0)
HEMOGLOBIN: 17 g/dL (ref 13.0–17.0)
MCH: 30.4 pg (ref 26.0–34.0)
MCHC: 32.6 g/dL (ref 30.0–36.0)
MCV: 93.2 fL (ref 78.0–100.0)
Platelets: 206 10*3/uL (ref 150–400)
RBC: 5.59 MIL/uL (ref 4.22–5.81)
RDW: 12.9 % (ref 11.5–15.5)
WBC: 7.8 10*3/uL (ref 4.0–10.5)

## 2018-08-05 NOTE — Progress Notes (Signed)
Primary Care Physician: Orpah Melter, MD Referring Physician: Dr. Fransico Him   Casey [name]. is a 62 y.o. male with a h/o obesity, s/p gastric sleeve, HTN, DM that presented for pre op for rt parotidectomy 8/8 and was found to be in rate controlled afib, from which he was asymptomatic. He was sent to the afib clinic for evaluation. 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 He has felt some fatigue, less stamina for several months. Was just seen by PCP yesterday and nothing out of the ordinary was noted with his heart rhythm. He denies any exertional chest pain.   He does not drink alcohol, no tobacco use,no excessive caffeine. He has kept his weight stable for several years. Was almost 100 lbs heavier before gastric sleeve.  I discussed with Dr. Rayann Heman and he recommended an echo and if ok, he would be considered at low risk for surgery and could start anticoagulation after surgery. However, pt is now back in the office as his echo did show a reduced EF at 30-35%. Dr. Rayann Heman discussed with Dr. Redmond Baseman and it is felt most appropriate  to delay surgery in order to obtain  LHC to further determine his risk for surgery. He does have cardiac risk factors for CAD , ie , obesity,  HTN, DM. He denies any exertional chest pain but c/o fatigue/ low stamina  for several months/early summer. LHC did show 3 vessel  moderate  CAD  F/u in afib clinic, he is now been on anticoagulation x 2 weeks without interruption and can now schedule cardioversion after another week, he is in agreement.  Today, he denies symptoms of palpitations, chest pain, shortness of breath, orthopnea, PND, lower extremity edema, dizziness, presyncope, syncope, or neurologic sequela.+ for fatigue. The patient is tolerating medications without difficulties and is otherwise without complaint today.   Past Medical History:  Diagnosis Date    . Arthritis   . Complication of anesthesia    diff. waking up  . Diabetes mellitus   . Headache(784.0)   . Hypertension    dr Marisue Humble   pcp  . Sleep apnea    prior to weight lose surgery-Dr. Don Perking not use CPAP   Past Surgical History:  Procedure Laterality Date  . BACK SURGERY     5  back surgeries  . CARPAL TUNNEL RELEASE     bil  . COLON SURGERY     2004 for colon rupture  . HERNIA REPAIR    . LAPAROSCOPIC GASTRIC BANDING    . LEFT HEART CATH AND CORONARY ANGIOGRAPHY N/A 07/21/2018   Procedure: LEFT HEART CATH AND CORONARY ANGIOGRAPHY;  Surgeon: Belva Crome, MD;  Location: [facility];  Service: Cardiovascular;  Laterality: N/A;  . MANDIBLE FRACTURE SURGERY     x 2  . TONSILLECTOMY      Current Outpatient Medications  Medication Sig Dispense Refill  . apixaban (ELIQUIS) 5 MG TABS tablet Take 1 tablet (5 mg total) by mouth 2 (two) times daily. 60 tablet 0  . aspirin 81 MG EC tablet Take 81 mg by mouth at bedtime. Swallow whole.     . carvedilol (COREG) 25 MG tablet Take 0.5 tablets (12.5 mg total) by mouth 2 (two) times daily. 60 tablet 3  . clomiPHENE (CLOMID) 50 MG tablet Take 25 mg by mouth 3 (three) times a week.    [name]  docusate sodium (COLACE) 100 MG capsule Take 200 mg by mouth daily.    [name] glimepiride (AMARYL) 2 MG tablet Take 2 mg by mouth daily with breakfast.    . HYDROcodone-acetaminophen (NORCO) 10-325 MG tablet Take 1 tablet by mouth every 4 (four) hours as needed (for severe back pain.).    [name] liraglutide (VICTOZA) 18 MG/3ML SOPN Inject 1.8 mg into the skin every evening.     . metFORMIN (GLUCOPHAGE) 500 MG tablet Take 1,000 mg by mouth 2 (two) times daily.    . Multiple Vitamin (MULTIVITAMIN WITH MINERALS) TABS tablet Take 1 tablet by mouth daily.    . sacubitril-valsartan (ENTRESTO) 24-26 MG Take 1 tablet by mouth 2 (two) times daily. 60 tablet 11   No current facility-administered medications for this encounter.     Allergies  Allergen  Reactions  . Penicillins Hives    Has patient had a PCN reaction causing immediate rash, facial/tongue/throat swelling, SOB or lightheadedness with hypotension: Unknown Has patient had a PCN reaction causing severe rash involving mucus membranes or skin necrosis: Unknown Has patient had a PCN reaction that required hospitalization: No Has patient had a PCN reaction occurring within the last 10 years: No Childhood reaction. If all of the above answers are "NO", then may proceed with Cephalosporin use.   [name] Percocet [Oxycodone-Acetaminophen] Anxiety    Hyperactivity & unhinged.    Social History   Socioeconomic History  . Marital status: Married    Spouse name: Not on file  . Number of children: Not on file  . Years of education: Not on file  . Highest education level: Not on file  Occupational History  . Not on file  Social Needs  . Financial resource strain: Not on file  . Food insecurity:    Worry: Not on file    Inability: Not on file  . Transportation needs:    Medical: Not on file    Non-medical: Not on file  Tobacco Use  . Smoking status: Former Smoker    Packs/day: 0.90    Years: 15.00    Pack years: 13.50    Types: Cigarettes    Last attempt to quit: 01/19/2006    Years since quitting: 12.5  . Smokeless tobacco: Never Used  Substance and Sexual Activity  . Alcohol use: No  . Drug use: No  . Sexual activity: Not on file  Lifestyle  . Physical activity:    Days per week: Not on file    Minutes per session: Not on file  . Stress: Not on file  Relationships  . Social connections:    Talks on phone: Not on file    Gets together: Not on file    Attends religious service: Not on file    Active member of club or organization: Not on file    Attends meetings of clubs or organizations: Not on file    Relationship status: Not on file  . Intimate partner violence:    Fear of current or ex partner: Not on file    Emotionally abused: Not on file    Physically  abused: Not on file    Forced sexual activity: Not on file  Other Topics Concern  . Not on file  Social History Narrative  . Not on file    Family History  Problem Relation Age of Onset  . Diabetes Maternal Grandfather   . Heart disease Neg Hx     ROS- All systems are reviewed and negative except as  per the HPI above  Physical Exam: Vitals:   08/05/18 0927  BP: 126/86  Pulse: 100  Weight: 120.7 kg  Height: 5\' 10"  (1.778 m)   Wt Readings from Last 3 Encounters:  08/05/18 120.7 kg  07/27/18 120.7 kg  07/21/18 121.1 kg    Labs: Lab Results  Component Value Date   NA 140 08/05/2018   K 4.3 08/05/2018   CL 107 08/05/2018   CO2 22 08/05/2018   GLUCOSE 153 (H) 08/05/2018   BUN 16 08/05/2018   CREATININE 0.94 08/05/2018   CALCIUM 9.4 08/05/2018   No results found for: INR No results found for: CHOL, HDL, LDLCALC, TRIG   GEN- The patient is well appearing, alert and oriented x 3 today.   Head- normocephalic, atraumatic Eyes-  Sclera clear, conjunctiva pink Ears- hearing intact Oropharynx- clear Neck- supple, no JVP Lymph- no cervical lymphadenopathy Lungs- Clear to ausculation bilaterally, normal work of breathing Heart- irregular rate and rhythm, no murmurs, rubs or gallops, PMI not laterally displaced GI- soft, NT, ND, + BS Extremities- no clubbing, cyanosis, or edema MS- no significant deformity or atrophy Skin- no rash or lesion Psych- euthymic mood, full affect Neuro- strength and sensation are intact  EKG- afib at 100 bpm, qrs int 90 ms, qtc 446 ms Epic records reviewed Echo-Study Conclusions  - Left ventricle: The cavity size was mildly dilated. Wall   thickness was increased in a pattern of mild LVH. Systolic   function was moderately to severely reduced. The estimated   ejection fraction was in the range of 30% to 35%. Diffuse   hypokinesis. - Aortic valve: There was trivial regurgitation. - Mitral valve: There was mild  regurgitation.  Impressions:  - Moderate to severe global reduction in LV systolic function; mild   LVE; mild LVH; trace AI; mild MR.  LHC- 8/21-Moderate three-vessel coronary artery disease.  60 to 70% mid RCA and 95% stenosis of the distal right coronary beyond the origin of the PDA.  Normal left main  Mid LAD 60 to 70% stenosis beyond the origin of the dominant first diagonal.  Large ramus intermedius with luminal irregularity.  Relatively small distribution circumflex with 70% obstruction in the first marginal and total occlusion of the distal circumflex before the origin of the small second obtuse marginal.  There are faint left to left collaterals to the distal circumflex.  Moderately severe left ventricular dysfunction with global hypokinesis, EF 35%.  Normal LVEDP.    RECOMMENDATIONS:   In absence of significant/limiting anginal symptoms, would recommend anti-ischemic therapy with beta-blockade and long-acting nitrates.  If symptoms develop or become refractory PCI of the very distal RCA, and LAD would be possible.  Lesions were not angiographically critical, and therefore based on data from the Courage trial, medical therapy would seem to be adequate.  Recommend guideline directed therapy for systolic heart failure: Transition to Entresto from ARB, transition Tenormin to carvedilol, add mineralocorticoid receptor antagonist (Aldactone or eplerenone).  With reference to heart failure, diabetes management should include an SGLT 2 agent to decrease incidence of heart failure symptoms.  Finally it may be helpful to have restoration of sinus rhythm.  LV dysfunction is out of proportion to the degree of coronary disease.   Recommend Aspirin 81mg  daily for moderate CAD.  Assessment and Plan: 1. Persistent  Afib Does not appear to have fluid weight gain, LEE, PND/orthopnea 2/2 afib He is now on eliquis 5 mg bid x 2 weeks, eligible for cardioversion after 9/12. He is  scheduled for  DCCV for 9/13. Risk vrs benefit discussed with pt Parotid gland surgery is on hold until pt is settled from afib and CAD standpoint Cbc/bmet today  2.CHA2DS2VASc score of 2(htn, DM)  Continue eliquis 5 mg bid,states no missed doses  3. CAD  Recently started on entresto and atenolol changed to  carvedilol ASA added Per Dr. Tamala Julian  F/u in afib clinic one week after cardioversion   Butch Penny C. Cynia Abruzzo, [facility] [address] [place], [place] [address] [phone]

## 2018-08-05 NOTE — H&P (View-Only) (Signed)
Primary Care Physician: Orpah Melter, MD Referring Physician: Dr. Fransico Him   Casey [name]. is a 62 y.o. male with a h/o obesity, s/p gastric sleeve, HTN, DM that presented for pre op for rt parotidectomy 8/8 and was found to be in rate controlled afib, from which he was asymptomatic. He was sent to the afib clinic for evaluation. 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 He has felt some fatigue, less stamina for several months. Was just seen by PCP yesterday and nothing out of the ordinary was noted with his heart rhythm. He denies any exertional chest pain.   He does not drink alcohol, no tobacco use,no excessive caffeine. He has kept his weight stable for several years. Was almost 100 lbs heavier before gastric sleeve.  I discussed with Dr. Rayann Heman and he recommended an echo and if ok, he would be considered at low risk for surgery and could start anticoagulation after surgery. However, pt is now back in the office as his echo did show a reduced EF at 30-35%. Dr. Rayann Heman discussed with Dr. Redmond Baseman and it is felt most appropriate  to delay surgery in order to obtain  LHC to further determine his risk for surgery. He does have cardiac risk factors for CAD , ie , obesity,  HTN, DM. He denies any exertional chest pain but c/o fatigue/ low stamina  for several months/early summer. LHC did show 3 vessel  moderate  CAD  F/u in afib clinic, he is now been on anticoagulation x 2 weeks without interruption and can now schedule cardioversion after another week, he is in agreement.  Today, he denies symptoms of palpitations, chest pain, shortness of breath, orthopnea, PND, lower extremity edema, dizziness, presyncope, syncope, or neurologic sequela.+ for fatigue. The patient is tolerating medications without difficulties and is otherwise without complaint today.   Past Medical History:  Diagnosis Date    . Arthritis   . Complication of anesthesia    diff. waking up  . Diabetes mellitus   . Headache(784.0)   . Hypertension    dr Marisue Humble   pcp  . Sleep apnea    prior to weight lose surgery-Dr. Don Perking not use CPAP   Past Surgical History:  Procedure Laterality Date  . BACK SURGERY     5  back surgeries  . CARPAL TUNNEL RELEASE     bil  . COLON SURGERY     2004 for colon rupture  . HERNIA REPAIR    . LAPAROSCOPIC GASTRIC BANDING    . LEFT HEART CATH AND CORONARY ANGIOGRAPHY N/A 07/21/2018   Procedure: LEFT HEART CATH AND CORONARY ANGIOGRAPHY;  Surgeon: Belva Crome, MD;  Location: [facility];  Service: Cardiovascular;  Laterality: N/A;  . MANDIBLE FRACTURE SURGERY     x 2  . TONSILLECTOMY      Current Outpatient Medications  Medication Sig Dispense Refill  . apixaban (ELIQUIS) 5 MG TABS tablet Take 1 tablet (5 mg total) by mouth 2 (two) times daily. 60 tablet 0  . aspirin 81 MG EC tablet Take 81 mg by mouth at bedtime. Swallow whole.     . carvedilol (COREG) 25 MG tablet Take 0.5 tablets (12.5 mg total) by mouth 2 (two) times daily. 60 tablet 3  . clomiPHENE (CLOMID) 50 MG tablet Take 25 mg by mouth 3 (three) times a week.    [name]  docusate sodium (COLACE) 100 MG capsule Take 200 mg by mouth daily.    [name] glimepiride (AMARYL) 2 MG tablet Take 2 mg by mouth daily with breakfast.    . HYDROcodone-acetaminophen (NORCO) 10-325 MG tablet Take 1 tablet by mouth every 4 (four) hours as needed (for severe back pain.).    [name] liraglutide (VICTOZA) 18 MG/3ML SOPN Inject 1.8 mg into the skin every evening.     . metFORMIN (GLUCOPHAGE) 500 MG tablet Take 1,000 mg by mouth 2 (two) times daily.    . Multiple Vitamin (MULTIVITAMIN WITH MINERALS) TABS tablet Take 1 tablet by mouth daily.    . sacubitril-valsartan (ENTRESTO) 24-26 MG Take 1 tablet by mouth 2 (two) times daily. 60 tablet 11   No current facility-administered medications for this encounter.     Allergies  Allergen  Reactions  . Penicillins Hives    Has patient had a PCN reaction causing immediate rash, facial/tongue/throat swelling, SOB or lightheadedness with hypotension: Unknown Has patient had a PCN reaction causing severe rash involving mucus membranes or skin necrosis: Unknown Has patient had a PCN reaction that required hospitalization: No Has patient had a PCN reaction occurring within the last 10 years: No Childhood reaction. If all of the above answers are "NO", then may proceed with Cephalosporin use.   [name] Percocet [Oxycodone-Acetaminophen] Anxiety    Hyperactivity & unhinged.    Social History   Socioeconomic History  . Marital status: Married    Spouse name: Not on file  . Number of children: Not on file  . Years of education: Not on file  . Highest education level: Not on file  Occupational History  . Not on file  Social Needs  . Financial resource strain: Not on file  . Food insecurity:    Worry: Not on file    Inability: Not on file  . Transportation needs:    Medical: Not on file    Non-medical: Not on file  Tobacco Use  . Smoking status: Former Smoker    Packs/day: 0.90    Years: 15.00    Pack years: 13.50    Types: Cigarettes    Last attempt to quit: 01/19/2006    Years since quitting: 12.5  . Smokeless tobacco: Never Used  Substance and Sexual Activity  . Alcohol use: No  . Drug use: No  . Sexual activity: Not on file  Lifestyle  . Physical activity:    Days per week: Not on file    Minutes per session: Not on file  . Stress: Not on file  Relationships  . Social connections:    Talks on phone: Not on file    Gets together: Not on file    Attends religious service: Not on file    Active member of club or organization: Not on file    Attends meetings of clubs or organizations: Not on file    Relationship status: Not on file  . Intimate partner violence:    Fear of current or ex partner: Not on file    Emotionally abused: Not on file    Physically  abused: Not on file    Forced sexual activity: Not on file  Other Topics Concern  . Not on file  Social History Narrative  . Not on file    Family History  Problem Relation Age of Onset  . Diabetes Maternal Grandfather   . Heart disease Neg Hx     ROS- All systems are reviewed and negative except as  per the HPI above  Physical Exam: Vitals:   08/05/18 0927  BP: 126/86  Pulse: 100  Weight: 120.7 kg  Height: 5\' 10"  (1.778 m)   Wt Readings from Last 3 Encounters:  08/05/18 120.7 kg  07/27/18 120.7 kg  07/21/18 121.1 kg    Labs: Lab Results  Component Value Date   NA 140 08/05/2018   K 4.3 08/05/2018   CL 107 08/05/2018   CO2 22 08/05/2018   GLUCOSE 153 (H) 08/05/2018   BUN 16 08/05/2018   CREATININE 0.94 08/05/2018   CALCIUM 9.4 08/05/2018   No results found for: INR No results found for: CHOL, HDL, LDLCALC, TRIG   GEN- The patient is well appearing, alert and oriented x 3 today.   Head- normocephalic, atraumatic Eyes-  Sclera clear, conjunctiva pink Ears- hearing intact Oropharynx- clear Neck- supple, no JVP Lymph- no cervical lymphadenopathy Lungs- Clear to ausculation bilaterally, normal work of breathing Heart- irregular rate and rhythm, no murmurs, rubs or gallops, PMI not laterally displaced GI- soft, NT, ND, + BS Extremities- no clubbing, cyanosis, or edema MS- no significant deformity or atrophy Skin- no rash or lesion Psych- euthymic mood, full affect Neuro- strength and sensation are intact  EKG- afib at 100 bpm, qrs int 90 ms, qtc 446 ms Epic records reviewed Echo-Study Conclusions  - Left ventricle: The cavity size was mildly dilated. Wall   thickness was increased in a pattern of mild LVH. Systolic   function was moderately to severely reduced. The estimated   ejection fraction was in the range of 30% to 35%. Diffuse   hypokinesis. - Aortic valve: There was trivial regurgitation. - Mitral valve: There was mild  regurgitation.  Impressions:  - Moderate to severe global reduction in LV systolic function; mild   LVE; mild LVH; trace AI; mild MR.  LHC- 8/21-Moderate three-vessel coronary artery disease.  60 to 70% mid RCA and 95% stenosis of the distal right coronary beyond the origin of the PDA.  Normal left main  Mid LAD 60 to 70% stenosis beyond the origin of the dominant first diagonal.  Large ramus intermedius with luminal irregularity.  Relatively small distribution circumflex with 70% obstruction in the first marginal and total occlusion of the distal circumflex before the origin of the small second obtuse marginal.  There are faint left to left collaterals to the distal circumflex.  Moderately severe left ventricular dysfunction with global hypokinesis, EF 35%.  Normal LVEDP.    RECOMMENDATIONS:   In absence of significant/limiting anginal symptoms, would recommend anti-ischemic therapy with beta-blockade and long-acting nitrates.  If symptoms develop or become refractory PCI of the very distal RCA, and LAD would be possible.  Lesions were not angiographically critical, and therefore based on data from the Courage trial, medical therapy would seem to be adequate.  Recommend guideline directed therapy for systolic heart failure: Transition to Entresto from ARB, transition Tenormin to carvedilol, add mineralocorticoid receptor antagonist (Aldactone or eplerenone).  With reference to heart failure, diabetes management should include an SGLT 2 agent to decrease incidence of heart failure symptoms.  Finally it may be helpful to have restoration of sinus rhythm.  LV dysfunction is out of proportion to the degree of coronary disease.   Recommend Aspirin 81mg  daily for moderate CAD.  Assessment and Plan: 1. Persistent  Afib Does not appear to have fluid weight gain, LEE, PND/orthopnea 2/2 afib He is now on eliquis 5 mg bid x 2 weeks, eligible for cardioversion after 9/12. He is  scheduled for  DCCV for 9/13. Risk vrs benefit discussed with pt Parotid gland surgery is on hold until pt is settled from afib and CAD standpoint Cbc/bmet today  2.CHA2DS2VASc score of 2(htn, DM)  Continue eliquis 5 mg bid,states no missed doses  3. CAD  Recently started on entresto and atenolol changed to  carvedilol ASA added Per Dr. Tamala Julian  F/u in afib clinic one week after cardioversion   Butch Penny C. Gillian Kluever, [facility] [address] [place], [place] [address] [phone]

## 2018-08-05 NOTE — Patient Instructions (Signed)
Cardioversion scheduled for Friday, September 13th  - Arrive at the [facility] and go to admitting at 8:30AM  -Do not eat or drink anything after midnight the night prior to your procedure.  - Take all your morning medication except diabetes medication with a sip of water prior to arrival.  - You will not be able to drive home after your procedure.

## 2018-08-09 ENCOUNTER — Encounter (HOSPITAL_COMMUNITY): Payer: Self-pay

## 2018-08-13 ENCOUNTER — Ambulatory Visit (HOSPITAL_COMMUNITY): Payer: PRIVATE HEALTH INSURANCE | Admitting: Anesthesiology

## 2018-08-13 ENCOUNTER — Encounter (HOSPITAL_COMMUNITY): Payer: Self-pay | Admitting: Anesthesiology

## 2018-08-13 ENCOUNTER — Encounter (HOSPITAL_COMMUNITY)
Admission: RE | Disposition: A | Discharge status: Home / Self Care | Payer: Self-pay | Source: Ambulatory Visit | Attending: Cardiology

## 2018-08-13 ENCOUNTER — Ambulatory Visit (HOSPITAL_COMMUNITY)
Admission: RE | Admit: 2018-08-13 | Discharge: 2018-08-13 | Disposition: A | Discharge status: Home / Self Care | Payer: PRIVATE HEALTH INSURANCE | Source: Ambulatory Visit | Attending: Cardiology | Admitting: Cardiology

## 2018-08-13 ENCOUNTER — Other Ambulatory Visit: Payer: Self-pay

## 2018-08-13 DIAGNOSIS — Z9884 Bariatric surgery status: Secondary | ICD-10-CM | POA: Diagnosis not present

## 2018-08-13 DIAGNOSIS — E119 Type 2 diabetes mellitus without complications: Secondary | ICD-10-CM | POA: Insufficient documentation

## 2018-08-13 DIAGNOSIS — Z7984 Long term (current) use of oral hypoglycemic drugs: Secondary | ICD-10-CM | POA: Diagnosis not present

## 2018-08-13 DIAGNOSIS — Z7982 Long term (current) use of aspirin: Secondary | ICD-10-CM | POA: Insufficient documentation

## 2018-08-13 DIAGNOSIS — I10 Essential (primary) hypertension: Secondary | ICD-10-CM | POA: Insufficient documentation

## 2018-08-13 DIAGNOSIS — I2510 Atherosclerotic heart disease of native coronary artery without angina pectoris: Secondary | ICD-10-CM | POA: Insufficient documentation

## 2018-08-13 DIAGNOSIS — I2582 Chronic total occlusion of coronary artery: Secondary | ICD-10-CM | POA: Insufficient documentation

## 2018-08-13 DIAGNOSIS — Z7901 Long term (current) use of anticoagulants: Secondary | ICD-10-CM | POA: Diagnosis not present

## 2018-08-13 DIAGNOSIS — Z880 Allergy status to penicillin: Secondary | ICD-10-CM | POA: Diagnosis not present

## 2018-08-13 DIAGNOSIS — Z87891 Personal history of nicotine dependence: Secondary | ICD-10-CM | POA: Diagnosis not present

## 2018-08-13 DIAGNOSIS — I4891 Unspecified atrial fibrillation: Secondary | ICD-10-CM

## 2018-08-13 DIAGNOSIS — G4730 Sleep apnea, unspecified: Secondary | ICD-10-CM | POA: Diagnosis not present

## 2018-08-13 DIAGNOSIS — M1990 Unspecified osteoarthritis, unspecified site: Secondary | ICD-10-CM | POA: Insufficient documentation

## 2018-08-13 DIAGNOSIS — Z885 Allergy status to narcotic agent status: Secondary | ICD-10-CM | POA: Diagnosis not present

## 2018-08-13 DIAGNOSIS — I481 Persistent atrial fibrillation: Secondary | ICD-10-CM | POA: Diagnosis not present

## 2018-08-13 HISTORY — PX: CARDIOVERSION: SHX1299

## 2018-08-13 LAB — GLUCOSE, CAPILLARY: GLUCOSE-CAPILLARY: 145 mg/dL — AB (ref 70–99)

## 2018-08-13 SURGERY — CARDIOVERSION
Anesthesia: General

## 2018-08-13 MED ORDER — SODIUM CHLORIDE 0.9 % IV SOLN
INTRAVENOUS | Status: DC
  Administered 2018-08-13: 09:00:00 via INTRAVENOUS

## 2018-08-13 MED ORDER — PROPOFOL 10 MG/ML IV BOLUS
INTRAVENOUS | Status: DC | PRN
  Administered 2018-08-13: 80 mg via INTRAVENOUS

## 2018-08-13 MED ORDER — LIDOCAINE 2% (20 MG/ML) 5 ML SYRINGE
INTRAMUSCULAR | Status: DC | PRN
  Administered 2018-08-13: 60 mg via INTRAVENOUS

## 2018-08-13 NOTE — Anesthesia Postprocedure Evaluation (Signed)
Anesthesia Post Note  Patient: Casey [name].  Procedure(s) Performed: CARDIOVERSION (N/A )     Patient location during evaluation: Endoscopy Anesthesia Type: General Level of consciousness: awake Pain management: pain level controlled Vital Signs Assessment: post-procedure vital signs reviewed and stable Respiratory status: spontaneous breathing Cardiovascular status: stable Anesthetic complications: no    Last Vitals:  Vitals:   08/13/18 1000 08/13/18 1010  BP: 123/89 120/76  Pulse: 84 76  Resp: 13 16  Temp:    SpO2: 95% 96%    Last Pain:  Vitals:   08/13/18 1010  TempSrc:   PainSc: 0-No pain                 Kimara Bencomo

## 2018-08-13 NOTE — Anesthesia Procedure Notes (Signed)
Procedure Name: MAC Date/Time: 08/13/2018 9:35 AM Performed by: Marsa Aris, CRNA Pre-anesthesia Checklist: Patient identified, Emergency Drugs available, Suction available, Patient being monitored and Timeout performed Patient Re-evaluated:Patient Re-evaluated prior to induction Oxygen Delivery Method: Ambu bag Preoxygenation: Pre-oxygenation with 100% oxygen Induction Type: IV induction Placement Confirmation: positive ETCO2 Dental Injury: Teeth and Oropharynx as per pre-operative assessment

## 2018-08-13 NOTE — Transfer of Care (Signed)
Immediate Anesthesia Transfer of Care Note  Patient: Casey [name].  Procedure(s) Performed: CARDIOVERSION (N/A )  Patient Location: Endoscopy Unit  Anesthesia Type:MAC  Level of Consciousness: awake, alert  and oriented  Airway & Oxygen Therapy: Patient Spontanous Breathing  Post-op Assessment: Report given to RN and Post -op Vital signs reviewed and stable  Post vital signs: Reviewed and stable  Last Vitals:  Vitals Value Taken Time  BP 159/106 08/13/2018  9:44 AM  Temp    Pulse 91 08/13/2018  9:45 AM  Resp 20 08/13/2018  9:45 AM  SpO2 96 % 08/13/2018  9:45 AM  Vitals shown include unvalidated device data.  Last Pain:  Vitals:   08/13/18 0856  TempSrc: Oral  PainSc:          Complications: No apparent anesthesia complications

## 2018-08-13 NOTE — Anesthesia Postprocedure Evaluation (Signed)
Anesthesia Post Note  Patient: Casey [name].  Procedure(s) Performed: CARDIOVERSION (N/A )     Anesthesia Post Evaluation  Last Vitals:  Vitals:   08/13/18 0948 08/13/18 0950  BP: 126/64 101/68  Pulse: 96 82  Resp: (!) 26 19  Temp: 36.6 C   SpO2: 93% 96%    Last Pain:  Vitals:   08/13/18 0948  TempSrc: Oral  PainSc: 0-No pain                 Jazmynn Pho

## 2018-08-13 NOTE — Interval H&P Note (Signed)
History and Physical Interval Note:  08/13/2018 9:36 AM  Casey [name].  has presented today for surgery, with the diagnosis of AFIB  The various methods of treatment have been discussed with the patient and family. After consideration of risks, benefits and other options for treatment, the patient has consented to  Procedure(s): CARDIOVERSION (N/A) as a surgical intervention .  The patient's history has been reviewed, patient examined, no change in status, stable for surgery.  I have reviewed the patient's chart and labs.  Questions were answered to the patient's satisfaction.     [name] [employer]

## 2018-08-13 NOTE — Anesthesia Preprocedure Evaluation (Signed)
Anesthesia Evaluation  Patient identified by MRN, date of birth, ID band Patient awake    Reviewed: Allergy & Precautions, NPO status , Patient's Chart, lab work & pertinent test results  Airway Mallampati: II  TM Distance: >3 FB     Dental   Pulmonary sleep apnea , former smoker,    breath sounds clear to auscultation       Cardiovascular hypertension,  Rhythm:Regular Rate:Normal     Neuro/Psych    GI/Hepatic History noted. CG   Endo/Other  diabetes  Renal/GU      Musculoskeletal  (+) Arthritis ,   Abdominal   Peds  Hematology   Anesthesia Other Findings   Reproductive/Obstetrics                             Anesthesia Physical Anesthesia Plan  ASA: III  Anesthesia Plan: General   Post-op Pain Management:    Induction: Intravenous  PONV Risk Score and Plan: Treatment may vary due to age or medical condition  Airway Management Planned: Simple Face Mask, Nasal Cannula and Mask  Additional Equipment:   Intra-op Plan:   Post-operative Plan:   Informed Consent: I have reviewed the patients History and Physical, chart, labs and discussed the procedure including the risks, benefits and alternatives for the proposed anesthesia with the patient or authorized representative who has indicated his/her understanding and acceptance.   Dental advisory given  Plan Discussed with: CRNA and Anesthesiologist  Anesthesia Plan Comments:         Anesthesia Quick Evaluation

## 2018-08-13 NOTE — Procedures (Signed)
Electrical Cardioversion Procedure Note Casey [name] [id number] July 30, 1956  Procedure: Electrical Cardioversion Indications:  Atrial Fibrillation  Procedure Details Consent: Risks of procedure as well as the alternatives and risks of each were explained to the (patient/caregiver).  Consent for procedure obtained. Time Out: Verified patient identification, verified procedure, site/side was marked, verified correct patient position, special equipment/implants available, medications/allergies/relevent history reviewed, required imaging and test results available.  Performed  Patient placed on cardiac monitor, pulse oximetry, supplemental oxygen as necessary.  Sedation given: Propofol per anesthesiology Pacer pads placed anterior and posterior chest.  Cardioverted 3 times, the 2nd two times with sternal pressure.  Cardioverted at [place].  Evaluation Findings: Post procedure EKG shows: Atrial Fibrillation Complications: None Patient did tolerate procedure well.  Failed DCCV.  Would consider Tikosyn.  Has appt next Friday at atrial fibrillation clinic.    Casey [name] 08/13/2018, 9:46 AM

## 2018-08-13 NOTE — Discharge Instructions (Signed)
Electrical Cardioversion, Care After °This sheet gives you information about how to care for yourself after your procedure. Your health care provider may also give you more specific instructions. If you have problems or questions, contact your health care provider. °What can I expect after the procedure? °After the procedure, it is common to have: °· Some redness on the skin where the shocks were given. ° °Follow these instructions at home: °· Do not drive for 24 hours if you were given a medicine to help you relax (sedative). °· Take over-the-counter and prescription medicines only as told by your health care provider. °· Ask your health care provider how to check your pulse. Check it often. °· Rest for 48 hours after the procedure or as told by your health care provider. °· Avoid or limit your caffeine use as told by your health care provider. °Contact a health care provider if: °· You feel like your heart is beating too quickly or your pulse is not regular. °· You have a serious muscle cramp that does not go away. °Get help right away if: °· You have discomfort in your chest. °· You are dizzy or you feel faint. °· You have trouble breathing or you are short of breath. °· Your speech is slurred. °· You have trouble moving an arm or leg on one side of your body. °· Your fingers or toes turn cold or blue. °This information is not intended to replace advice given to you by your health care provider. Make sure you discuss any questions you have with your health care provider. °Document Released: 09/07/2013 Document Revised: 06/20/2016 Document Reviewed: 05/23/2016 °Elsevier Interactive Patient Education © 2018 [employer] ° °

## 2018-08-15 ENCOUNTER — Encounter (HOSPITAL_COMMUNITY): Payer: Self-pay | Admitting: Cardiology

## 2018-08-17 ENCOUNTER — Other Ambulatory Visit (HOSPITAL_COMMUNITY): Payer: Self-pay | Admitting: Nurse Practitioner

## 2018-08-19 ENCOUNTER — Encounter: Payer: Self-pay | Admitting: Interventional Cardiology

## 2018-08-20 ENCOUNTER — Ambulatory Visit (HOSPITAL_COMMUNITY)
Admission: RE | Admit: 2018-08-20 | Discharge: 2018-08-20 | Disposition: A | Discharge status: Home / Self Care | Payer: PRIVATE HEALTH INSURANCE | Source: Ambulatory Visit | Attending: Nurse Practitioner | Admitting: Nurse Practitioner

## 2018-08-20 ENCOUNTER — Encounter (HOSPITAL_COMMUNITY): Payer: Self-pay | Admitting: Nurse Practitioner

## 2018-08-20 ENCOUNTER — Telehealth: Payer: Self-pay | Admitting: Pharmacist

## 2018-08-20 VITALS — BP 138/90 | HR 88 | Ht 70.0 in | Wt 265.0 lb

## 2018-08-20 DIAGNOSIS — I481 Persistent atrial fibrillation: Secondary | ICD-10-CM

## 2018-08-20 DIAGNOSIS — Z7982 Long term (current) use of aspirin: Secondary | ICD-10-CM | POA: Diagnosis not present

## 2018-08-20 DIAGNOSIS — Z7901 Long term (current) use of anticoagulants: Secondary | ICD-10-CM | POA: Insufficient documentation

## 2018-08-20 DIAGNOSIS — G4730 Sleep apnea, unspecified: Secondary | ICD-10-CM | POA: Diagnosis not present

## 2018-08-20 DIAGNOSIS — M1990 Unspecified osteoarthritis, unspecified site: Secondary | ICD-10-CM | POA: Diagnosis not present

## 2018-08-20 DIAGNOSIS — R9431 Abnormal electrocardiogram [ECG] [EKG]: Secondary | ICD-10-CM | POA: Diagnosis not present

## 2018-08-20 DIAGNOSIS — E119 Type 2 diabetes mellitus without complications: Secondary | ICD-10-CM | POA: Diagnosis not present

## 2018-08-20 DIAGNOSIS — Z885 Allergy status to narcotic agent status: Secondary | ICD-10-CM | POA: Insufficient documentation

## 2018-08-20 DIAGNOSIS — I10 Essential (primary) hypertension: Secondary | ICD-10-CM | POA: Insufficient documentation

## 2018-08-20 DIAGNOSIS — Z79899 Other long term (current) drug therapy: Secondary | ICD-10-CM | POA: Diagnosis not present

## 2018-08-20 DIAGNOSIS — I4819 Other persistent atrial fibrillation: Secondary | ICD-10-CM

## 2018-08-20 DIAGNOSIS — I2510 Atherosclerotic heart disease of native coronary artery without angina pectoris: Secondary | ICD-10-CM | POA: Insufficient documentation

## 2018-08-20 DIAGNOSIS — Z87891 Personal history of nicotine dependence: Secondary | ICD-10-CM | POA: Diagnosis not present

## 2018-08-20 DIAGNOSIS — I2582 Chronic total occlusion of coronary artery: Secondary | ICD-10-CM | POA: Diagnosis not present

## 2018-08-20 DIAGNOSIS — Z7984 Long term (current) use of oral hypoglycemic drugs: Secondary | ICD-10-CM | POA: Insufficient documentation

## 2018-08-20 DIAGNOSIS — Z880 Allergy status to penicillin: Secondary | ICD-10-CM | POA: Diagnosis not present

## 2018-08-20 MED ORDER — APIXABAN 5 MG PO TABS: 5.0000 mg | ORAL_TABLET | Freq: Two times a day (BID) | ORAL | 3 refills | Status: DC

## 2018-08-20 NOTE — Progress Notes (Signed)
Primary Care Physician: Orpah Melter, MD Referring Physician: Dr. Fransico Him   Casey [name]. is a 62 y.o. male with a h/o obesity, s/p gastric sleeve, HTN, DM that presented for pre op for rt parotidectomy 8/8 and was found to be in rate controlled afib, from which he was asymptomatic. He was sent to the afib clinic for evaluation. 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 He has felt some fatigue, less stamina for several months. Was just seen by PCP yesterday and nothing out of the ordinary was noted with his heart rhythm. He denies any exertional chest pain.   He does not drink alcohol, no tobacco use,no excessive caffeine. He has kept his weight stable for several years. Was almost 100 lbs heavier before gastric sleeve.  I discussed with Dr. Rayann Heman and he recommended an echo and if ok, he would be considered at low risk for surgery and could start anticoagulation after surgery. However, pt is now back in the office as his echo did show a reduced EF at 30-35%. Dr. Rayann Heman discussed with Dr. Redmond Baseman and it is felt most appropriate  to delay surgery in order to obtain  LHC to further determine his risk for surgery. He does have cardiac risk factors for CAD , ie , obesity,  HTN, DM. He denies any exertional chest pain but c/o fatigue/ low stamina  for several months/early summer. LHC did show 3 vessel  moderate  CAD  F/u in afib clinic, he is now been on anticoagulation x 2 weeks without interruption and can now schedule cardioversion after another week, he is in agreement.  F/u in afib clinic,9/20, he unfortunately had unsuccessful cardioversion and is back in the clinic to discuss antiarrythmic's.  Today, he denies symptoms of palpitations, chest pain, shortness of breath, orthopnea, PND, lower extremity edema, dizziness, presyncope, syncope, or neurologic sequela.+ for fatigue. The patient is  tolerating medications without difficulties and is otherwise without complaint today.   Past Medical History:  Diagnosis Date  . Arthritis   . Complication of anesthesia    diff. waking up  . Diabetes mellitus   . Headache(784.0)   . Hypertension    dr Marisue Humble   pcp  . Sleep apnea    prior to weight lose surgery-Dr. Don Perking not use CPAP   Past Surgical History:  Procedure Laterality Date  . BACK SURGERY     5  back surgeries  . CARDIOVERSION N/A 08/13/2018   Procedure: CARDIOVERSION;  Surgeon: Larey Dresser, MD;  Location: [facility];  Service: Cardiovascular;  Laterality: N/A;  . CARPAL TUNNEL RELEASE     bil  . COLON SURGERY     2004 for colon rupture  . HERNIA REPAIR    . LAPAROSCOPIC GASTRIC BANDING    . LEFT HEART CATH AND CORONARY ANGIOGRAPHY N/A 07/21/2018   Procedure: LEFT HEART CATH AND CORONARY ANGIOGRAPHY;  Surgeon: Belva Crome, MD;  Location: [place] CV LAB;  Service: Cardiovascular;  Laterality: N/A;  . MANDIBLE FRACTURE SURGERY     x 2  . TONSILLECTOMY      Current Outpatient Medications  Medication Sig Dispense Refill  . apixaban (ELIQUIS) 5 MG TABS tablet Take 1 tablet (5 mg total) by mouth 2 (two) times daily. 60 tablet 3  . aspirin 81 MG EC tablet Take 81 mg by mouth at bedtime. Swallow whole.     [name]  carvedilol (COREG) 25 MG tablet Take 0.5 tablets (12.5 mg total) by mouth 2 (two) times daily. 60 tablet 3  . clomiPHENE (CLOMID) 50 MG tablet Take 25 mg by mouth 3 (three) times a week. M W F    . docusate sodium (COLACE) 100 MG capsule Take 200 mg by mouth 2 (two) times daily.     [name] glimepiride (AMARYL) 2 MG tablet Take 2 mg by mouth daily with breakfast.    . HYDROcodone-acetaminophen (NORCO) 10-325 MG tablet Take 1 tablet by mouth every 4 (four) hours as needed (for severe back pain.).    [name] liraglutide (VICTOZA) 18 MG/3ML SOPN Inject 1.8 mg into the skin every evening.     . metFORMIN (GLUCOPHAGE) 500 MG tablet Take 1,000 mg by mouth 2  (two) times daily.    . Multiple Vitamin (MULTIVITAMIN WITH MINERALS) TABS tablet Take 1 tablet by mouth daily.    . sacubitril-valsartan (ENTRESTO) 24-26 MG Take 1 tablet by mouth 2 (two) times daily. 60 tablet 11   No current facility-administered medications for this encounter.     Allergies  Allergen Reactions  . Penicillins Hives    Has patient had a PCN reaction causing immediate rash, facial/tongue/throat swelling, SOB or lightheadedness with hypotension: Unknown Has patient had a PCN reaction causing severe rash involving mucus membranes or skin necrosis: Unknown Has patient had a PCN reaction that required hospitalization: No Has patient had a PCN reaction occurring within the last 10 years: No Childhood reaction. If all of the above answers are "NO", then may proceed with Cephalosporin use.   [name] Percocet [Oxycodone-Acetaminophen] Anxiety    Hyperactivity & unhinged.    Social History   Socioeconomic History  . Marital status: Married    Spouse name: Not on file  . Number of children: Not on file  . Years of education: Not on file  . Highest education level: Not on file  Occupational History  . Not on file  Social Needs  . Financial resource strain: Not on file  . Food insecurity:    Worry: Not on file    Inability: Not on file  . Transportation needs:    Medical: Not on file    Non-medical: Not on file  Tobacco Use  . Smoking status: Former Smoker    Packs/day: 0.90    Years: 15.00    Pack years: 13.50    Types: Cigarettes    Last attempt to quit: 01/19/2006    Years since quitting: 12.5  . Smokeless tobacco: Never Used  Substance and Sexual Activity  . Alcohol use: No  . Drug use: No  . Sexual activity: Not on file  Lifestyle  . Physical activity:    Days per week: Not on file    Minutes per session: Not on file  . Stress: Not on file  Relationships  . Social connections:    Talks on phone: Not on file    Gets together: Not on file    Attends  religious service: Not on file    Active member of club or organization: Not on file    Attends meetings of clubs or organizations: Not on file    Relationship status: Not on file  . Intimate partner violence:    Fear of current or ex partner: Not on file    Emotionally abused: Not on file    Physically abused: Not on file    Forced sexual activity: Not on file  Other Topics Concern  .  Not on file  Social History Narrative  . Not on file    Family History  Problem Relation Age of Onset  . Diabetes Maternal Grandfather   . Heart disease Neg Hx     ROS- All systems are reviewed and negative except as per the HPI above  Physical Exam: Vitals:   08/20/18 0930  BP: 138/90  Pulse: 88  Weight: 120.2 kg  Height: 5\' 10"  (1.778 m)   Wt Readings from Last 3 Encounters:  08/20/18 120.2 kg  08/05/18 120.7 kg  07/27/18 120.7 kg    Labs: Lab Results  Component Value Date   NA 140 08/05/2018   K 4.3 08/05/2018   CL 107 08/05/2018   CO2 22 08/05/2018   GLUCOSE 153 (H) 08/05/2018   BUN 16 08/05/2018   CREATININE 0.94 08/05/2018   CALCIUM 9.4 08/05/2018   No results found for: INR No results found for: CHOL, HDL, LDLCALC, TRIG   GEN- The patient is well appearing, alert and oriented x 3 today.   Head- normocephalic, atraumatic Eyes-  Sclera clear, conjunctiva pink Ears- hearing intact Oropharynx- clear Neck- supple, no JVP Lymph- no cervical lymphadenopathy Lungs- Clear to ausculation bilaterally, normal work of breathing Heart- irregular rate and rhythm, no murmurs, rubs or gallops, PMI not laterally displaced GI- soft, NT, ND, + BS Extremities- no clubbing, cyanosis, or edema MS- no significant deformity or atrophy Skin- no rash or lesion Psych- euthymic mood, full affect Neuro- strength and sensation are intact  EKG- afib at 100 bpm, qrs int 90 ms, qtc 446 ms Epic records reviewed Echo-Study Conclusions  - Left ventricle: The cavity size was mildly dilated.  Wall   thickness was increased in a pattern of mild LVH. Systolic   function was moderately to severely reduced. The estimated   ejection fraction was in the range of 30% to 35%. Diffuse   hypokinesis. - Aortic valve: There was trivial regurgitation. - Mitral valve: There was mild regurgitation.  Impressions:  - Moderate to severe global reduction in LV systolic function; mild   LVE; mild LVH; trace AI; mild MR.  LHC- 8/21-Moderate three-vessel coronary artery disease.  60 to 70% mid RCA and 95% stenosis of the distal right coronary beyond the origin of the PDA.  Normal left main  Mid LAD 60 to 70% stenosis beyond the origin of the dominant first diagonal.  Large ramus intermedius with luminal irregularity.  Relatively small distribution circumflex with 70% obstruction in the first marginal and total occlusion of the distal circumflex before the origin of the small second obtuse marginal.  There are faint left to left collaterals to the distal circumflex.  Moderately severe left ventricular dysfunction with global hypokinesis, EF 35%.  Normal LVEDP.    RECOMMENDATIONS:   In absence of significant/limiting anginal symptoms, would recommend anti-ischemic therapy with beta-blockade and long-acting nitrates.  If symptoms develop or become refractory PCI of the very distal RCA, and LAD would be possible.  Lesions were not angiographically critical, and therefore based on data from the Courage trial, medical therapy would seem to be adequate.  Recommend guideline directed therapy for systolic heart failure: Transition to Entresto from ARB, transition Tenormin to carvedilol, add mineralocorticoid receptor antagonist (Aldactone or eplerenone).  With reference to heart failure, diabetes management should include an SGLT 2 agent to decrease incidence of heart failure symptoms.  Finally it may be helpful to have restoration of sinus rhythm.  LV dysfunction is out of proportion to the  degree of  coronary disease.   Recommend Aspirin 81mg  daily for moderate CAD.  Assessment and Plan: 1. Persistent  Afib Unfortunately has unsuccessful cardioversion Discussed means of trying to rstore rhythm, H is not an candidate for multaq with redcued EF, 1C's are not an option for CAD He is young for amiodarone  for potential side effects He is pending  carvedilol uptitration for CAD/ LV dysfunction and addition of sotalol may contribute to bradycardia Therefore, I feel Tikosyn is the best option Risk vrs benefit of tikosyn discussed  He will check on price of drug but he does meet financial assistance Pt would like to come into the hospital 10/1. No benadryl use PharmD to screened drugs and no issues  Parotid gland surgery is on hold until pt is settled from afib and CAD standpoint  2.CHA2DS2VASc score of 2(htn, DM)  Continue eliquis 5 mg bid,states no missed doses  3. CAD  Recently started on entresto and atenolol changed to  carvedilol ASA added Per Dr. Tamala Julian  F/u in [place] clinic 10/1   Geroge Baseman. [place] [address] [place],  [address] [phone]

## 2018-08-20 NOTE — Telephone Encounter (Signed)
Medication list reviewed in anticipation of upcoming Tikosyn initiation. Patient is not taking any contraindicated or QTc prolonging medications.   Patient is anticoagulated on Eliquis 5mg BID on the appropriate dose. Please ensure that patient has not missed any anticoagulation doses in the 3 weeks prior to Tikosyn initiation.   Patient will need to be counseled to avoid use of Benadryl while on Tikosyn and in the 2-3 days prior to Tikosyn initiation. 

## 2018-08-21 ENCOUNTER — Other Ambulatory Visit: Payer: Self-pay | Admitting: Endocrinology

## 2018-08-26 ENCOUNTER — Encounter (HOSPITAL_COMMUNITY): Payer: Self-pay

## 2018-08-31 ENCOUNTER — Inpatient Hospital Stay (HOSPITAL_COMMUNITY)
Admission: AD | Admit: 2018-08-31 | Discharge: 2018-09-03 | DRG: 310 | Disposition: A | Discharge status: Home / Self Care | Payer: PRIVATE HEALTH INSURANCE | Attending: Internal Medicine | Admitting: Internal Medicine

## 2018-08-31 ENCOUNTER — Ambulatory Visit (HOSPITAL_COMMUNITY)
Admission: RE | Admit: 2018-08-31 | Discharge: 2018-08-31 | Disposition: A | Discharge status: Home / Self Care | Payer: PRIVATE HEALTH INSURANCE | Source: Ambulatory Visit | Attending: Nurse Practitioner | Admitting: Nurse Practitioner

## 2018-08-31 ENCOUNTER — Other Ambulatory Visit: Payer: Self-pay

## 2018-08-31 ENCOUNTER — Encounter (HOSPITAL_COMMUNITY): Payer: Self-pay | Admitting: Nurse Practitioner

## 2018-08-31 VITALS — BP 128/82 | HR 105 | Ht 70.0 in | Wt 265.0 lb

## 2018-08-31 DIAGNOSIS — Z7901 Long term (current) use of anticoagulants: Secondary | ICD-10-CM

## 2018-08-31 DIAGNOSIS — Z7984 Long term (current) use of oral hypoglycemic drugs: Secondary | ICD-10-CM

## 2018-08-31 DIAGNOSIS — Z833 Family history of diabetes mellitus: Secondary | ICD-10-CM | POA: Diagnosis not present

## 2018-08-31 DIAGNOSIS — E669 Obesity, unspecified: Secondary | ICD-10-CM | POA: Diagnosis present

## 2018-08-31 DIAGNOSIS — E119 Type 2 diabetes mellitus without complications: Secondary | ICD-10-CM | POA: Diagnosis present

## 2018-08-31 DIAGNOSIS — Z6837 Body mass index (BMI) 37.0-37.9, adult: Secondary | ICD-10-CM

## 2018-08-31 DIAGNOSIS — M1990 Unspecified osteoarthritis, unspecified site: Secondary | ICD-10-CM | POA: Diagnosis present

## 2018-08-31 DIAGNOSIS — Z79899 Other long term (current) drug therapy: Secondary | ICD-10-CM | POA: Diagnosis not present

## 2018-08-31 DIAGNOSIS — Z87891 Personal history of nicotine dependence: Secondary | ICD-10-CM | POA: Diagnosis not present

## 2018-08-31 DIAGNOSIS — Z5181 Encounter for therapeutic drug level monitoring: Secondary | ICD-10-CM

## 2018-08-31 DIAGNOSIS — I4819 Other persistent atrial fibrillation: Secondary | ICD-10-CM

## 2018-08-31 DIAGNOSIS — Z880 Allergy status to penicillin: Secondary | ICD-10-CM | POA: Diagnosis not present

## 2018-08-31 DIAGNOSIS — I2510 Atherosclerotic heart disease of native coronary artery without angina pectoris: Secondary | ICD-10-CM | POA: Diagnosis present

## 2018-08-31 DIAGNOSIS — Z7982 Long term (current) use of aspirin: Secondary | ICD-10-CM

## 2018-08-31 DIAGNOSIS — Z885 Allergy status to narcotic agent status: Secondary | ICD-10-CM

## 2018-08-31 DIAGNOSIS — Z9884 Bariatric surgery status: Secondary | ICD-10-CM

## 2018-08-31 DIAGNOSIS — I428 Other cardiomyopathies: Secondary | ICD-10-CM | POA: Diagnosis not present

## 2018-08-31 DIAGNOSIS — I10 Essential (primary) hypertension: Secondary | ICD-10-CM | POA: Diagnosis present

## 2018-08-31 LAB — BASIC METABOLIC PANEL
Anion gap: 10 (ref 5–15)
BUN: 12 mg/dL (ref 6–20)
CO2: 28 mmol/L (ref 22–32)
CREATININE: 1.02 mg/dL (ref 0.61–1.24)
Calcium: 9.5 mg/dL (ref 8.9–10.3)
Chloride: 100 mmol/L (ref 98–111)
GFR calc non Af Amer: >60 mL/min (ref >60)
Glucose, Bld: 193 mg/dL — ABNORMAL HIGH (ref 70–99)
Potassium: 3.9 mmol/L (ref 3.5–5.1)
Sodium: 138 mmol/L (ref 135–145)

## 2018-08-31 LAB — GLUCOSE, CAPILLARY
GLUCOSE-CAPILLARY: 140 mg/dL — AB (ref 70–99)
GLUCOSE-CAPILLARY: 168 mg/dL — AB (ref 70–99)
GLUCOSE-CAPILLARY: 204 mg/dL — AB (ref 70–99)

## 2018-08-31 LAB — POTASSIUM: POTASSIUM: 5 mmol/L (ref 3.5–5.1)

## 2018-08-31 LAB — MAGNESIUM: Magnesium: 1.9 mg/dL (ref 1.7–2.4)

## 2018-08-31 MED ORDER — ADULT MULTIVITAMIN W/MINERALS CH
1.0000 | ORAL_TABLET | Freq: Every day | ORAL | Status: DC
  Administered 2018-09-01 – 2018-09-03 (×3): 1 via ORAL
  Filled 2018-08-31 (×3): qty 1

## 2018-08-31 MED ORDER — DOCUSATE SODIUM 100 MG PO CAPS
200.0000 mg | ORAL_CAPSULE | Freq: Two times a day (BID) | ORAL | Status: DC
  Administered 2018-08-31 – 2018-09-03 (×6): 200 mg via ORAL
  Filled 2018-08-31 (×6): qty 2

## 2018-08-31 MED ORDER — GLIMEPIRIDE 1 MG PO TABS
2.0000 mg | ORAL_TABLET | Freq: Every day | ORAL | Status: DC
  Administered 2018-09-01 – 2018-09-03 (×2): 2 mg via ORAL
  Filled 2018-08-31 (×2): qty 2

## 2018-08-31 MED ORDER — SACUBITRIL-VALSARTAN 24-26 MG PO TABS
1.0000 | ORAL_TABLET | Freq: Two times a day (BID) | ORAL | Status: DC
  Administered 2018-08-31 – 2018-09-03 (×6): 1 via ORAL
  Filled 2018-08-31 (×6): qty 1

## 2018-08-31 MED ORDER — LIRAGLUTIDE 18 MG/3ML ~~LOC~~ SOPN: 1.8000 mg | PEN_INJECTOR | Freq: Every evening | SUBCUTANEOUS | Status: DC

## 2018-08-31 MED ORDER — HYDROCODONE-ACETAMINOPHEN 10-325 MG PO TABS
1.0000 | ORAL_TABLET | ORAL | Status: DC | PRN
  Administered 2018-08-31 – 2018-09-03 (×6): 1 via ORAL
  Filled 2018-08-31 (×6): qty 1

## 2018-08-31 MED ORDER — APIXABAN 5 MG PO TABS
5.0000 mg | ORAL_TABLET | Freq: Two times a day (BID) | ORAL | Status: DC
  Administered 2018-08-31 – 2018-09-03 (×6): 5 mg via ORAL
  Filled 2018-08-31 (×6): qty 1

## 2018-08-31 MED ORDER — CARVEDILOL 12.5 MG PO TABS
12.5000 mg | ORAL_TABLET | Freq: Two times a day (BID) | ORAL | Status: DC
  Administered 2018-08-31 – 2018-09-03 (×6): 12.5 mg via ORAL
  Filled 2018-08-31 (×6): qty 1

## 2018-08-31 MED ORDER — SODIUM CHLORIDE 0.9% FLUSH: 3.0000 mL | INTRAVENOUS | Status: DC | PRN

## 2018-08-31 MED ORDER — DOFETILIDE 500 MCG PO CAPS
500.0000 ug | ORAL_CAPSULE | Freq: Two times a day (BID) | ORAL | Status: DC
  Administered 2018-08-31 – 2018-09-03 (×6): 500 ug via ORAL
  Filled 2018-08-31 (×6): qty 1

## 2018-08-31 MED ORDER — POTASSIUM CHLORIDE CRYS ER 20 MEQ PO TBCR
30.0000 meq | EXTENDED_RELEASE_TABLET | Freq: Once | ORAL | Status: AC
  Administered 2018-08-31: 30 meq via ORAL
  Filled 2018-08-31: qty 1

## 2018-08-31 MED ORDER — ASPIRIN EC 81 MG PO TBEC
81.0000 mg | DELAYED_RELEASE_TABLET | Freq: Every day | ORAL | Status: DC
  Administered 2018-09-01 – 2018-09-03 (×3): 81 mg via ORAL
  Filled 2018-08-31 (×3): qty 1

## 2018-08-31 MED ORDER — METFORMIN HCL ER 500 MG PO TB24
1000.0000 mg | ORAL_TABLET | Freq: Two times a day (BID) | ORAL | Status: DC
  Administered 2018-08-31 – 2018-09-03 (×5): 1000 mg via ORAL
  Filled 2018-08-31 (×5): qty 2

## 2018-08-31 MED ORDER — SODIUM CHLORIDE 0.9 % IV SOLN
250.0000 mL | INTRAVENOUS | Status: DC | PRN
  Administered 2018-09-01: 250 mL via INTRAVENOUS
  Administered 2018-09-02: 500 mL via INTRAVENOUS

## 2018-08-31 MED ORDER — SODIUM CHLORIDE 0.9% FLUSH
3.0000 mL | Freq: Two times a day (BID) | INTRAVENOUS | Status: DC
  Administered 2018-08-31 – 2018-09-01 (×2): 3 mL via INTRAVENOUS

## 2018-08-31 NOTE — Progress Notes (Signed)
Pharmacy Review for Dofetilide (Tikosyn) Initiation  Admit Complaint: 62 y.o. male admitted 08/31/2018 with atrial fibrillation to be initiated on dofetilide.   Assessment:  Patient Exclusion Criteria: If any screening criteria checked as "Yes", then  patient  should NOT receive dofetilide until criteria item is corrected. If "Yes" please indicate correction plan.  YES  NO Patient  Exclusion Criteria Correction Plan  [x]  [x]  Baseline QTc interval is greater than or equal to 440 msec. IF above YES box checked dofetilide contraindicated unless patient has ICD; then may proceed if QTc 500-550 msec or with known ventricular conduction abnormalities may proceed with QTc 550-600 msec. QTc =   QTc 441, ok to initiate  []  [x]  Magnesium level is less than 1.8 mEq/l : Last magnesium:  Lab Results  Component Value Date   MG 1.9 08/31/2018         [x]  []  Potassium level is less than 4 mEq/l : Last potassium:  Lab Results  Component Value Date   K 3.9 08/31/2018       Supplement just given  []  [x]  Patient is known or suspected to have a digoxin level greater than 2 ng/ml: No results found for: DIGOXIN    []  [x]  Creatinine clearance less than 20 ml/min (calculated using Cockcroft-Gault, actual body weight and serum creatinine): Estimated Creatinine Clearance: 100.1 mL/min (by C-G formula based on SCr of 1.02 mg/dL).    []  [x]  Patient has received drugs known to prolong the QT intervals within the last 48 hours (phenothiazines, tricyclics or tetracyclic antidepressants, erythromycin, H-1 antihistamines, cisapride, fluoroquinolones, azithromycin). Drugs not listed above may have an, as yet, undetected potential to prolong the QT interval, updated information on QT prolonging agents is available at this website:QT prolonging agents   []  [x]  Patient received a dose of hydrochlorothiazide (Oretic) alone or in any combination including triamterene (Dyazide, Maxzide) in the last 48 hours.   []  [x]   Patient received a medication known to increase dofetilide plasma concentrations prior to initial dofetilide dose:  . Trimethoprim (Primsol, Proloprim) in the last 36 hours . Verapamil (Calan, Verelan) in the last 36 hours or a sustained release dose in the last 72 hours . Megestrol (Megace) in the last 5 days  . Cimetidine (Tagamet) in the last 6 hours . Ketoconazole (Nizoral) in the last 24 hours . Itraconazole (Sporanox) in the last 48 hours  . Prochlorperazine (Compazine) in the last 36 hours    []  [x]  Patient is known to have a history of torsades de pointes; congenital or acquired long QT syndromes.   []  [x]  Patient has received a Class 1 antiarrhythmic with less than 2 half-lives since last dose. (Disopyramide, Quinidine, Procainamide, Lidocaine, Mexiletine, Flecainide, Propafenone)   []  [x]  Patient has received amiodarone therapy in the past 3 months or amiodarone level is greater than 0.3 ng/ml.    Patient has been appropriately anticoagulated with Eliquis.  Ordering provider was confirmed at [email or web address] if they are not listed on the [facility] Prescribers list.  Goal of Therapy: Follow renal function, electrolytes, potential drug interactions, and dose adjustment. Provide education and 1 week supply at discharge.  Plan:  [x]   Physician selected initial dose within range recommended for patients level of renal function - will monitor for response.  []   Physician selected initial dose outside of range recommended for patients level of renal function - will discuss if the dose should be altered at this time.   Select One Calculated CrCl  Dose q12h  [x]  >  60 ml/min 500 mcg  []  40-60 ml/min 250 mcg  []  20-40 ml/min 125 mcg   2. Follow up QTc after the first 5 doses, renal function, electrolytes (K & Mg) daily x 3 days, dose adjustment, success of initiation and facilitate 1 week discharge supply as clinically indicated.  3. Initiate Tikosyn education video (Call  [phone] and ask for video # [phone]).  4. Tikosyn 500 mcg Q12h to begin at 8pm tonight.  5.  Victoza order cancelled.  Not stocked at [facility] and patient reports that he sometimes forgets to use it, but will resume after discharge, rather than having someone bring in home supply.  On Metformin XR and Glimeprimide.  Arty Baumgartner, [place] Pager: [phone] or phone: [phone]   2:37 PM 08/31/2018

## 2018-08-31 NOTE — Progress Notes (Signed)
Primary Care Physician: Orpah Melter, MD Referring Physician: Dr. Fransico Him   Casey [name]. is a 62 y.o. male with a h/o obesity, s/p gastric sleeve, HTN, DM that presented for pre op for rt parotidectomy 8/8 and was found to be in rate controlled afib, from which he was asymptomatic. He was sent to the afib clinic for evaluation. 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 He has felt some fatigue, less stamina for several months. Was just seen by PCP yesterday and nothing out of the ordinary was noted with his heart rhythm. He denies any exertional chest pain.   He does not drink alcohol, no tobacco use,no excessive caffeine. He has kept his weight stable for several years. Was almost 100 lbs heavier before gastric sleeve.  I discussed with Dr. Rayann Heman and he recommended an echo and if ok, he would be considered at low risk for surgery and could start anticoagulation after surgery. However, pt is now back in the office as his echo did show a reduced EF at 30-35%. Dr. Rayann Heman discussed with Dr. Redmond Baseman and it is felt most appropriate  to delay surgery in order to obtain  LHC to further determine his risk for surgery. He does have cardiac risk factors for CAD , ie , obesity,  HTN, DM. He denies any exertional chest pain but c/o fatigue/ low stamina  for several months/early summer. LHC did show 3 vessel  moderate  CAD  F/u in afib clinic, he is now been on anticoagulation x 2 weeks without interruption and can now schedule cardioversion after another week, he is in agreement.  F/u in afib clinic,9/20, he unfortunately had unsuccessful cardioversion and is back in the clinic to discuss antiarrythmic's.It was decided that he would come into the hospital for Tikosyn after he checked on the price of the drug.  F/u in afib clinic, 10/1, for admission for Tikosyn.  He will get the drug free for the rest  of the year as he had met his out of pocket deductible.   Today, he denies symptoms of palpitations, chest pain, shortness of breath, orthopnea, PND, lower extremity edema, dizziness, presyncope, syncope, or neurologic sequela.+ for fatigue. The patient is tolerating medications without difficulties and is otherwise without complaint today.   Past Medical History:  Diagnosis Date  . Arthritis   . Complication of anesthesia    diff. waking up  . Diabetes mellitus   . Headache(784.0)   . Hypertension    dr Marisue Humble   pcp  . Sleep apnea    prior to weight lose surgery-Dr. Don Perking not use CPAP   Past Surgical History:  Procedure Laterality Date  . BACK SURGERY     5  back surgeries  . CARDIOVERSION N/A 08/13/2018   Procedure: CARDIOVERSION;  Surgeon: Larey Dresser, MD;  Location: [facility] ENDOSCOPY;  Service: Cardiovascular;  Laterality: N/A;  . CARPAL TUNNEL RELEASE     bil  . COLON SURGERY     2004 for colon rupture  . HERNIA REPAIR    . LAPAROSCOPIC GASTRIC BANDING    . LEFT HEART CATH AND CORONARY ANGIOGRAPHY N/A 07/21/2018   Procedure: LEFT HEART CATH AND CORONARY ANGIOGRAPHY;  Surgeon: Belva Crome, MD;  Location: [facility];  Service: Cardiovascular;  Laterality: N/A;  . MANDIBLE FRACTURE SURGERY     x 2  . TONSILLECTOMY  Current Outpatient Medications  Medication Sig Dispense Refill  . apixaban (ELIQUIS) 5 MG TABS tablet Take 1 tablet (5 mg total) by mouth 2 (two) times daily. 60 tablet 3  . aspirin 81 MG EC tablet Take 81 mg by mouth at bedtime. Swallow whole.     . carvedilol (COREG) 25 MG tablet Take 0.5 tablets (12.5 mg total) by mouth 2 (two) times daily. 60 tablet 3  . docusate sodium (COLACE) 100 MG capsule Take 200 mg by mouth 2 (two) times daily.     [name] glimepiride (AMARYL) 2 MG tablet Take 2 mg by mouth daily with breakfast.    . HYDROcodone-acetaminophen (NORCO) 10-325 MG tablet Take 1 tablet by mouth every 4 (four) hours as needed (for severe  back pain.).    [name] liraglutide (VICTOZA) 18 MG/3ML SOPN Inject 1.8 mg into the skin every evening.     . metFORMIN (GLUCOPHAGE-XR) 500 MG 24 hr tablet Take 1,000 mg by mouth 2 (two) times daily.  1  . Multiple Vitamin (MULTIVITAMIN WITH MINERALS) TABS tablet Take 1 tablet by mouth daily.    . sacubitril-valsartan (ENTRESTO) 24-26 MG Take 1 tablet by mouth 2 (two) times daily. 60 tablet 11   No current facility-administered medications for this encounter.     Allergies  Allergen Reactions  . Penicillins Hives    Has patient had a PCN reaction causing immediate rash, facial/tongue/throat swelling, SOB or lightheadedness with hypotension: Unknown Has patient had a PCN reaction causing severe rash involving mucus membranes or skin necrosis: Unknown Has patient had a PCN reaction that required hospitalization: No Has patient had a PCN reaction occurring within the last 10 years: No Childhood reaction. If all of the above answers are "NO", then may proceed with Cephalosporin use.   [name] Percocet [Oxycodone-Acetaminophen] Anxiety    Hyperactivity & unhinged.    Social History   Socioeconomic History  . Marital status: Married    Spouse name: Not on file  . Number of children: Not on file  . Years of education: Not on file  . Highest education level: Not on file  Occupational History  . Not on file  Social Needs  . Financial resource strain: Not on file  . Food insecurity:    Worry: Not on file    Inability: Not on file  . Transportation needs:    Medical: Not on file    Non-medical: Not on file  Tobacco Use  . Smoking status: Former Smoker    Packs/day: 0.90    Years: 15.00    Pack years: 13.50    Types: Cigarettes    Last attempt to quit: 01/19/2006    Years since quitting: 12.6  . Smokeless tobacco: Never Used  Substance and Sexual Activity  . Alcohol use: No  . Drug use: No  . Sexual activity: Not on file  Lifestyle  . Physical activity:    Days per week: Not on file     Minutes per session: Not on file  . Stress: Not on file  Relationships  . Social connections:    Talks on phone: Not on file    Gets together: Not on file    Attends religious service: Not on file    Active member of club or organization: Not on file    Attends meetings of clubs or organizations: Not on file    Relationship status: Not on file  . Intimate partner violence:    Fear of current or ex partner: Not on  file    Emotionally abused: Not on file    Physically abused: Not on file    Forced sexual activity: Not on file  Other Topics Concern  . Not on file  Social History Narrative  . Not on file    Family History  Problem Relation Age of Onset  . Diabetes Maternal Grandfather   . Heart disease Neg Hx     ROS- All systems are reviewed and negative except as per the HPI above  Physical Exam: Vitals:   08/31/18 1127  BP: 128/82  Pulse: (!) 105  Weight: 120.2 kg  Height: '5\' 10"'$  (1.778 m)   Wt Readings from Last 3 Encounters:  08/31/18 120.2 kg  08/20/18 120.2 kg  08/05/18 120.7 kg    Labs: Lab Results  Component Value Date   NA 140 08/05/2018   K 4.3 08/05/2018   CL 107 08/05/2018   CO2 22 08/05/2018   GLUCOSE 153 (H) 08/05/2018   BUN 16 08/05/2018   CREATININE 0.94 08/05/2018   CALCIUM 9.4 08/05/2018   No results found for: INR No results found for: CHOL, HDL, LDLCALC, TRIG   GEN- The patient is well appearing, alert and oriented x 3 today.   Head- normocephalic, atraumatic Eyes-  Sclera clear, conjunctiva pink Ears- hearing intact Oropharynx- clear Neck- supple, no JVP Lymph- no cervical lymphadenopathy Lungs- Clear to ausculation bilaterally, normal work of breathing Heart- irregular rate and rhythm, no murmurs, rubs or gallops, PMI not laterally displaced GI- soft, NT, ND, + BS Extremities- no clubbing, cyanosis, or edema MS- no significant deformity or atrophy Skin- no rash or lesion Psych- euthymic mood, full affect Neuro- strength and  sensation are intact  EKG- afib at 105 bpm, qrs int 90 ms, qtc 441 ms (stable) Epic records reviewed Echo-Study Conclusions  - Left ventricle: The cavity size was mildly dilated. Wall   thickness was increased in a pattern of mild LVH. Systolic   function was moderately to severely reduced. The estimated   ejection fraction was in the range of 30% to 35%. Diffuse   hypokinesis. - Aortic valve: There was trivial regurgitation. - Mitral valve: There was mild regurgitation.  Impressions:  - Moderate to severe global reduction in LV systolic function; mild   LVE; mild LVH; trace AI; mild MR.  LHC- 8/21-Moderate three-vessel coronary artery disease.  60 to 70% mid RCA and 95% stenosis of the distal right coronary beyond the origin of the PDA.  Normal left main  Mid LAD 60 to 70% stenosis beyond the origin of the dominant first diagonal.  Large ramus intermedius with luminal irregularity.  Relatively small distribution circumflex with 70% obstruction in the first marginal and total occlusion of the distal circumflex before the origin of the small second obtuse marginal.  There are faint left to left collaterals to the distal circumflex.  Moderately severe left ventricular dysfunction with global hypokinesis, EF 35%.  Normal LVEDP.    RECOMMENDATIONS:   In absence of significant/limiting anginal symptoms, would recommend anti-ischemic therapy with beta-blockade and long-acting nitrates.  If symptoms develop or become refractory PCI of the very distal RCA, and LAD would be possible.  Lesions were not angiographically critical, and therefore based on data from the Courage trial, medical therapy would seem to be adequate.  Recommend guideline directed therapy for systolic heart failure: Transition to Entresto from ARB, transition Tenormin to carvedilol, add mineralocorticoid receptor antagonist (Aldactone or eplerenone).  With reference to heart failure, diabetes management should  include  an SGLT 2 agent to decrease incidence of heart failure symptoms.  Finally it may be helpful to have restoration of sinus rhythm.  LV dysfunction is out of proportion to the degree of coronary disease.   Recommend Aspirin 66m daily for moderate CAD.  Assessment and Plan: 1. Persistent  Afib Unfortunately had unsuccessful cardioversion Discussed means of trying to restore rhythm, He is not an candidate for multaq with redcued EF, 1C's are not an option for CAD He is young for amiodarone  for potential side effects He is pending  carvedilol uptitration for CAD/ LV dysfunction and addition of sotalol may contribute to bradycardia Therefore, I feel Tikosyn is the best option Risk vrs benefit of tikosyn discussed  He will get drug free for the rest of the year,  but he does meet financial assistance to get drug free after that No benadryl use PharmD  screened drugs and no issues  Parotid gland surgery is on hold until pt is settled from afib and CAD standpoint Bmet/mag today  2.CHA2DS2VASc score of 2(htn, DM)  Continue eliquis 5 mg bid,states no missed doses  3. CAD  Recently started on entresto and atenolol changed to  carvedilol ASA added Per Dr. STamala Julian he does have a f/u appointment with Dr. STamala Julian10/3, this will be moved out couple of weeks, as he will still be in the hospital then  To [address], [facility] [id number]

## 2018-08-31 NOTE — H&P (Addendum)
Cardiology Admission History and Physical:   Patient ID: Casey [name]. MRN: [id number]; DOB: [birth year]   Admission date: 08/31/2018  Primary Care Provider: Orpah Melter, MD Primary Cardiologist: Dr. Tamala Julian Primary Electrophysiologist:  none  Chief Complaint:  Tikosyn initiation  Patient Profile:   Casey [name]. is a 62 y.o. male with PMHx of obesity, s/p gastric sleeve, HTN, DM that presented for pre op for rt parotidectomy 8/8 and was found to be in rate controlled afib, from which he was asymptomatic.  He was referred to the afib clinic for evaluation. 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 He reported some fatigue, less stamina for several months. Had just been seen by PCP the day prior and nothing out of the ordinary was noted with his heart rhythm. He denied any exertional chest pain.   In d/w Dr. Rayann Heman planned for an echo and if ok, coule be considered at low risk for surgery and could start anticoagulation after surgery. However, his echo noted a reduced EF at 30-35%. Dr. Rayann Heman discussed with Dr. Redmond Baseman and it is felt most appropriate  to delay surgery in order to obtain  LHC to further determine his risk for surgery. He does have cardiac risk factors for CAD , ie , obesity,  HTN, DM. He denied any exertional chest pain but c/o fatigue/ low stamina  for several months/early summer. LHC did show 3 vessel  moderate CAD, with recommendation for medical management, planned for DCCV that was unsuccessful, and planned for AAD tx w/Tikosyn  History of Present Illness:   Casey [name] presents today for admission for Tikosyn initiation.  We reviewed Tikosyn protocol, he reports D. Kayleen Memos, NP discussed potential risks/benefits with him, and what to expect here, discussed planned DCCV Thursday if not in SR.  He is agreeable to proceed with load, DCCV if needed.   Past Medical  History:  Diagnosis Date  . Arthritis   . Complication of anesthesia    diff. waking up  . Diabetes mellitus   . Headache(784.0)   . Hypertension    dr Marisue Humble   pcp  . Sleep apnea    prior to weight lose surgery-Dr. Don Perking not use CPAP    Past Surgical History:  Procedure Laterality Date  . BACK SURGERY     5  back surgeries  . CARDIOVERSION N/A 08/13/2018   Procedure: CARDIOVERSION;  Surgeon: Larey Dresser, MD;  Location: [facility] ENDOSCOPY;  Service: Cardiovascular;  Laterality: N/A;  . CARPAL TUNNEL RELEASE     bil  . COLON SURGERY     2004 for colon rupture  . HERNIA REPAIR    . LAPAROSCOPIC GASTRIC BANDING    . LEFT HEART CATH AND CORONARY ANGIOGRAPHY N/A 07/21/2018   Procedure: LEFT HEART CATH AND CORONARY ANGIOGRAPHY;  Surgeon: Belva Crome, MD;  Location: [facility];  Service: Cardiovascular;  Laterality: N/A;  . MANDIBLE FRACTURE SURGERY     x 2  . TONSILLECTOMY       Medications Prior to Admission: Prior to Admission medications   Medication Sig Start Date End Date Taking? Authorizing Provider  apixaban (ELIQUIS) 5 MG TABS tablet Take 1 tablet (5 mg total) by mouth 2 (two) times daily. 08/20/18   Sherran Needs, NP  aspirin 81 MG EC tablet Take 81 mg by mouth at bedtime. Swallow whole.  [provider]  carvedilol (COREG) 25 MG tablet Take 0.5 tablets (12.5 mg total) by mouth 2 (two) times daily. 07/27/18   Belva Crome, MD  docusate sodium (COLACE) 100 MG capsule Take 200 mg by mouth 2 (two) times daily.     [provider]  glimepiride (AMARYL) 2 MG tablet Take 2 mg by mouth daily with breakfast.    [provider]  HYDROcodone-acetaminophen (NORCO) 10-325 MG tablet Take 1 tablet by mouth every 4 (four) hours as needed (for severe back pain.).    [provider]  liraglutide (VICTOZA) 18 MG/3ML SOPN Inject 1.8 mg into the skin every evening.  11/26/16   [provider]  metFORMIN (GLUCOPHAGE-XR)  500 MG 24 hr tablet Take 1,000 mg by mouth 2 (two) times daily. 08/19/18   [provider]  Multiple Vitamin (MULTIVITAMIN WITH MINERALS) TABS tablet Take 1 tablet by mouth daily.    [provider]  sacubitril-valsartan (ENTRESTO) 24-26 MG Take 1 tablet by mouth 2 (two) times daily. 07/22/18   Belva Crome, MD     Allergies:    Allergies  Allergen Reactions  . Penicillins Hives    Has patient had a PCN reaction causing immediate rash, facial/tongue/throat swelling, SOB or lightheadedness with hypotension: Unknown Has patient had a PCN reaction causing severe rash involving mucus membranes or skin necrosis: Unknown Has patient had a PCN reaction that required hospitalization: No Has patient had a PCN reaction occurring within the last 10 years: No Childhood reaction. If all of the above answers are "NO", then may proceed with Cephalosporin use.   [name] Percocet [Oxycodone-Acetaminophen] Anxiety    Hyperactivity & unhinged.    Social History:   Social History   Socioeconomic History  . Marital status: Married    Spouse name: Not on file  . Number of children: Not on file  . Years of education: Not on file  . Highest education level: Not on file  Occupational History  . Not on file  Social Needs  . Financial resource strain: Not on file  . Food insecurity:    Worry: Not on file    Inability: Not on file  . Transportation needs:    Medical: Not on file    Non-medical: Not on file  Tobacco Use  . Smoking status: Former Smoker    Packs/day: 0.90    Years: 15.00    Pack years: 13.50    Types: Cigarettes    Last attempt to quit: 01/19/2006    Years since quitting: 12.6  . Smokeless tobacco: Never Used  Substance and Sexual Activity  . Alcohol use: No  . Drug use: No  . Sexual activity: Not on file  Lifestyle  . Physical activity:    Days per week: Not on file    Minutes per session: Not on file  . Stress: Not on file  Relationships  . Social  connections:    Talks on phone: Not on file    Gets together: Not on file    Attends religious service: Not on file    Active member of club or organization: Not on file    Attends meetings of clubs or organizations: Not on file    Relationship status: Not on file  . Intimate partner violence:    Fear of current or ex partner: Not on file    Emotionally abused: Not on file    Physically abused: Not on file    Forced sexual activity: Not  on file  Other Topics Concern  . Not on file  Social History Narrative  . Not on file    Family History:   The patient's family history includes Diabetes in his maternal grandfather. There is no history of Heart disease.    ROS:  Please see the history of present illness.  All other ROS reviewed and negative.     Physical Exam/Data:   Vitals:   08/31/18 1221  BP: (!) 133/103  Pulse: 92  Temp: 98.1 F (36.7 C)  TempSrc: Oral  SpO2: 99%   No intake or output data in the 24 hours ending 08/31/18 1246 There were no vitals filed for this visit. There is no height or weight on file to calculate BMI.  General:  Well nourished, well developed, in no acute distress HEENT: normal Lymph: no adenopathy Neck: no JVD Endocrine:  No thryomegaly Vascular: No carotid bruits  Cardiac:  irreg-irreg; no murmurs, gallops or rubs Lungs:  CTA b/l, no wheezing, rhonchi or rales  Abd: soft, nontender, obese Ext: no edema Musculoskeletal:  No deformities Skin: warm and dry  Neuro:  No gross focal abnormalities noted Psych:  Normal affect    EKG:  The ECG that was done today was personally reviewed and demonstrates AFib 105bpm, QTc 424ms  Relevant CV Studies:  07/21/18; LHC  Moderate three-vessel coronary artery disease.  60 to 70% mid RCA and 95% stenosis of the distal right coronary beyond the origin of the PDA.  Normal left main  Mid LAD 60 to 70% stenosis beyond the origin of the dominant first diagonal.  Large ramus intermedius with  luminal irregularity.  Relatively small distribution circumflex with 70% obstruction in the first marginal and total occlusion of the distal circumflex before the origin of the small second obtuse marginal.  There are faint left to left collaterals to the distal circumflex.  Moderately severe left ventricular dysfunction with global hypokinesis, EF 35%.  Normal LVEDP.   RECOMMENDATIONS:  In absence of significant/limiting anginal symptoms, would recommend anti-ischemic therapy with beta-blockade and long-acting nitrates.  If symptoms develop or become refractory PCI of the very distal RCA, and LAD would be possible.  Lesions were not angiographically critical, and therefore based on data from the Courage trial, medical therapy would seem to be adequate.  Recommend guideline directed therapy for systolic heart failure: Transition to Entresto from ARB, transition Tenormin to carvedilol, add mineralocorticoid receptor antagonist (Aldactone or eplerenone).  With reference to heart failure, diabetes management should include an SGLT 2 agent to decrease incidence of heart failure symptoms.  Finally it may be helpful to have restoration of sinus rhythm.  LV dysfunction is out of proportion to the degree of coronary disease   07/13/18: TTE Study Conclusions - Left ventricle: The cavity size was mildly dilated. Wall   thickness was increased in a pattern of mild LVH. Systolic   function was moderately to severely reduced. The estimated   ejection fraction was in the range of 30% to 35%. Diffuse   hypokinesis. - Aortic valve: There was trivial regurgitation. - Mitral valve: There was mild regurgitation. Impressions: - Moderate to severe global reduction in LV systolic function; mild   LVE; mild LVH; trace AI; mild MR.  Laboratory Data:  ChemistryNo results for input(s): NA, K, CL, CO2, GLUCOSE, BUN, CREATININE, CALCIUM, GFRNONAA, GFRAA, ANIONGAP in the last 168 hours.  No results for input(s):  PROT, ALBUMIN, AST, ALT, ALKPHOS, BILITOT in the last 168 hours. HematologyNo results for input(s): WBC,  RBC, HGB, HCT, MCV, MCH, MCHC, RDW, PLT in the last 168 hours. Cardiac EnzymesNo results for input(s): TROPONINI in the last 168 hours. No results for input(s): TROPIPOC in the last 168 hours.  BNPNo results for input(s): BNP, PROBNP in the last 168 hours.  DDimer No results for input(s): DDIMER in the last 168 hours.  Radiology/Studies:  No results found.  Assessment and Plan:   1. Persistent AFib     CHA2DS2Vasc is 3, on Eliquis, appropriately dosed     K+ 3.9, replacement ordered     Mag 1.9     Creat 1.02 (cal CrCl is 95)     QTc OK to start  2. HTN     Continue home meds  3. DM     Continue home regime  4. CAD     No anginal complaints     contionue home meds    For questions or updates, please contact [place] Please consult [email or web address] for contact info under        Signed, Baldwin Jamaica, PA-C  08/31/2018 12:46 PM    I have seen, examined the patient, and reviewed the above assessment and plan.  Changes to above are made where necessary.  On exam, iRRR.  He reports compliance with anticoagulation without interruption.  Will admit for initiation of tikosyn at this time.  Co Sign: Thompson Grayer, MD 08/31/2018 9:29 PM

## 2018-08-31 NOTE — Discharge Instructions (Addendum)
You have an appointment set up with the Atrial Fibrillation Clinic.  Multiple studies have shown that being followed by a dedicated atrial fibrillation clinic in addition to the standard care you receive from your other physicians improves health. We believe that enrollment in the atrial fibrillation clinic will allow us to better care for you.  ° °The phone number to the Atrial Fibrillation Clinic is [phone]. The clinic is staffed Monday through Friday from 8:30am to 5pm. ° °Parking Directions: The clinic is located in the Heart and Vascular Building connected to [facility]. °1)From [address] turn on to [address] and go to the 3rd entrance  (Heart and Vascular entrance) on the right. °2)Look to the right for Heart &Vascular Parking Garage. °3)A code for the entrance is required please call the clinic to receive this.   °4)Take the elevators to the 1st floor. Registration is in the room with the glass walls at the end of the hallway. ° °If you have any trouble parking or locating the clinic, please don’t hesitate to call [phone]. ° ° ° °Information on my medicine - ELIQUIS® (apixaban) ° °This medication education was reviewed with me or my healthcare representative as part of my discharge preparation.  ° °Why was Eliquis® prescribed for you? °Eliquis® was prescribed for you to reduce the risk of a blood clot forming that can cause a stroke if you have a medical condition called atrial fibrillation (a type of irregular heartbeat). ° °What do You need to know about Eliquis® ? °Take your Eliquis® TWICE DAILY - one tablet in the morning and one tablet in the evening with or without food. If you have difficulty swallowing the tablet whole please discuss with your pharmacist how to take the medication safely. ° °Take Eliquis® exactly as prescribed by your doctor and DO NOT stop taking Eliquis® without talking to the doctor who prescribed the medication.  Stopping may increase your risk of  developing a stroke.  Refill your prescription before you run out. ° °After discharge, you should have regular check-up appointments with your healthcare provider that is prescribing your Eliquis®.  In the future your dose may need to be changed if your kidney function or weight changes by a significant amount or as you get older. ° °What do you do if you miss a dose? °If you miss a dose, take it as soon as you remember on the same day and resume taking twice daily.  Do not take more than one dose of ELIQUIS at the same time to make up a missed dose. ° °Important Safety Information °A possible side effect of Eliquis® is bleeding. You should call your healthcare provider right away if you experience any of the following: °? Bleeding from an injury or your nose that does not stop. °? Unusual colored urine (red or dark brown) or unusual colored stools (red or black). °? Unusual bruising for unknown reasons. °? A serious fall or if you hit your head (even if there is no bleeding). ° °Some medicines may interact with Eliquis® and might increase your risk of bleeding or clotting while on Eliquis®. To help avoid this, consult your healthcare provider or pharmacist prior to using any new prescription or non-prescription medications, including herbals, vitamins, non-steroidal anti-inflammatory drugs (NSAIDs) and supplements. ° °This website has more information on Eliquis® (apixaban): http://www.eliquis.com/eliquis/home ° °

## 2018-09-01 ENCOUNTER — Other Ambulatory Visit: Payer: Self-pay

## 2018-09-01 ENCOUNTER — Telehealth: Payer: Self-pay | Admitting: Interventional Cardiology

## 2018-09-01 ENCOUNTER — Encounter (HOSPITAL_COMMUNITY): Payer: Self-pay

## 2018-09-01 LAB — BASIC METABOLIC PANEL
Anion gap: 6 (ref 5–15)
BUN: 13 mg/dL (ref 6–20)
CHLORIDE: 103 mmol/L (ref 98–111)
CO2: 27 mmol/L (ref 22–32)
Calcium: 8.6 mg/dL — ABNORMAL LOW (ref 8.9–10.3)
Creatinine, Ser: 1.1 mg/dL (ref 0.61–1.24)
GFR calc Af Amer: >60 mL/min (ref >60)
GFR calc non Af Amer: >60 mL/min (ref >60)
Glucose, Bld: 128 mg/dL — ABNORMAL HIGH (ref 70–99)
POTASSIUM: 3.8 mmol/L (ref 3.5–5.1)
SODIUM: 136 mmol/L (ref 135–145)

## 2018-09-01 LAB — GLUCOSE, CAPILLARY
GLUCOSE-CAPILLARY: 150 mg/dL — AB (ref 70–99)
GLUCOSE-CAPILLARY: 148 mg/dL — AB (ref 70–99)
GLUCOSE-CAPILLARY: 197 mg/dL — AB (ref 70–99)
GLUCOSE-CAPILLARY: 174 mg/dL — AB (ref 70–99)

## 2018-09-01 LAB — MAGNESIUM: Magnesium: 1.7 mg/dL (ref 1.7–2.4)

## 2018-09-01 MED ORDER — POTASSIUM CHLORIDE CRYS ER 20 MEQ PO TBCR
30.0000 meq | EXTENDED_RELEASE_TABLET | Freq: Once | ORAL | Status: AC
  Administered 2018-09-01: 30 meq via ORAL
  Filled 2018-09-01: qty 1

## 2018-09-01 MED ORDER — SODIUM CHLORIDE 0.9% FLUSH
3.0000 mL | Freq: Two times a day (BID) | INTRAVENOUS | Status: DC
  Administered 2018-09-01 – 2018-09-03 (×5): 3 mL via INTRAVENOUS

## 2018-09-01 MED ORDER — HYDROCORTISONE 1 % EX CREA
1.0000 "application " | TOPICAL_CREAM | Freq: Three times a day (TID) | CUTANEOUS | Status: DC | PRN
  Filled 2018-09-01: qty 28

## 2018-09-01 MED ORDER — MAGNESIUM SULFATE 2 GM/50ML IV SOLN
2.0000 g | Freq: Once | INTRAVENOUS | Status: AC
  Administered 2018-09-01: 2 g via INTRAVENOUS
  Filled 2018-09-01: qty 50

## 2018-09-01 MED ORDER — SODIUM CHLORIDE 0.9 % IV SOLN: 250.0000 mL | INTRAVENOUS | Status: DC

## 2018-09-01 MED ORDER — SODIUM CHLORIDE 0.9% FLUSH: 3.0000 mL | INTRAVENOUS | Status: DC | PRN

## 2018-09-01 NOTE — Telephone Encounter (Signed)
Spoke to patient who is in the hospital for Tikosyn start.  He was inquiring about an appt with Dr Tamala Julian for 10/3, which I told him was cancelled.  He has an Afib clinic f/u with Butch Penny on 10/11.  He has a question about his Carvedilol dosage, which I told him I would forward to Dr Thompson Caul nurse.  Please call, thank you.

## 2018-09-01 NOTE — Telephone Encounter (Signed)
New Message:     Pt is currently in the hospital getting his first tikosyn injection and was due to see [place] 09/02/18 for a 4-6 week f/u. Pt is asking when his next appt should be?

## 2018-09-01 NOTE — Telephone Encounter (Signed)
lpmtcb 10/2

## 2018-09-01 NOTE — Progress Notes (Addendum)
Progress Note  Patient Name: Casey [name]. Date of Encounter: 09/01/2018  Primary Cardiologist: Dr. Tamala Julian  Subjective   Not much sleep last night, feels well, no CP or SOB  Inpatient Medications    Scheduled Meds: . apixaban  5 mg Oral BID  . aspirin EC  81 mg Oral Daily  . carvedilol  12.5 mg Oral BID  . docusate sodium  200 mg Oral BID  . dofetilide  500 mcg Oral BID  . glimepiride  2 mg Oral Q breakfast  . metFORMIN  1,000 mg Oral BID WC  . multivitamin with minerals  1 tablet Oral Daily  . potassium chloride  30 mEq Oral Once  . sacubitril-valsartan  1 tablet Oral BID  . sodium chloride flush  3 mL Intravenous Q12H   Continuous Infusions: . sodium chloride    . magnesium sulfate 1 - 4 g bolus IVPB     PRN Meds: sodium chloride, HYDROcodone-acetaminophen, sodium chloride flush   Vital Signs    Vitals:   08/31/18 1221 08/31/18 2048 09/01/18 0500  BP: (!) 133/103 (!) 127/92 120/79  Pulse: 92 96 85  Resp:  (!) 22 19  Temp: 98.1 F (36.7 C) 98.2 F (36.8 C) 98.8 F (37.1 C)  TempSrc: Oral Oral Oral  SpO2: 99% 100% 100%   No intake or output data in the 24 hours ending 09/01/18 0718 There were no vitals filed for this visit.  Telemetry   AFib, 90's - Personally Reviewed  ECG    AFib  - Personally Reviewed  Physical Exam   GEN: No acute distress.   Neck: No JVD Cardiac: irreg-irreg, no murmurs, rubs, or gallops.  Respiratory: CTA b/l. GI: Soft, nontender, non-distended  MS: No edema; No deformity. Neuro:  Nonfocal  Psych: Normal affect   Labs    Chemistry Recent Labs  Lab 08/31/18 1139 08/31/18 1844 09/01/18 0348  NA 138  --  136  K 3.9 5.0 3.8  CL 100  --  103  CO2 28  --  27  GLUCOSE 193*  --  128*  BUN 12  --  13  CREATININE 1.02  --  1.10  CALCIUM 9.5  --  8.6*  GFRNONAA >60  --  >60  GFRAA >60  --  >60  ANIONGAP 10  --  6     HematologyNo results for input(s): WBC, RBC, HGB, HCT, MCV, MCH, MCHC, RDW, PLT in the last  168 hours.  Cardiac EnzymesNo results for input(s): TROPONINI in the last 168 hours. No results for input(s): TROPIPOC in the last 168 hours.   BNPNo results for input(s): BNP, PROBNP in the last 168 hours.   DDimer No results for input(s): DDIMER in the last 168 hours.   Radiology    No results found.  Cardiac Studies   07/21/18; LHC  Moderate three-vessel coronary artery disease.  60 to 70% mid RCA and 95% stenosis of the distal right coronary beyond the origin of the PDA.  Normal left main  Mid LAD 60 to 70% stenosis beyond the origin of the dominant first diagonal.  Large ramus intermedius with luminal irregularity.  Relatively small distribution circumflex with 70% obstruction in the first marginal and total occlusion of the distal circumflex before the origin of the small second obtuse marginal. There are faint left to left collaterals to the distal circumflex.  Moderately severe left ventricular dysfunction with global hypokinesis, EF 35%. Normal LVEDP. RECOMMENDATIONS:  In absence of significant/limiting anginal  symptoms, would recommend anti-ischemic therapy with beta-blockade and long-acting nitrates. If symptoms develop or become refractory PCI of the very distal RCA, and LAD would be possible. Lesions were not angiographically critical, and therefore based on data from the Courage trial, medical therapy would seem to be adequate.  Recommend guideline directed therapy for systolic heart failure: Transition to Entresto from ARB, transition Tenormin to carvedilol, add mineralocorticoid receptor antagonist (Aldactone or eplerenone). With reference to heart failure, diabetes management should include an SGLT 2 agent to decrease incidence of heart failure symptoms.  Finally it may be helpful to have restoration of sinus rhythm. LV dysfunction is out of proportion to the degree of coronary disease  07/13/18: TTE Study Conclusions - Left ventricle: The cavity size was  mildly dilated. Wall thickness was increased in a pattern of mild LVH. Systolic function was moderately to severely reduced. The estimated ejection fraction was in the range of 30% to 35%. Diffuse hypokinesis. - Aortic valve: There was trivial regurgitation. - Mitral valve: There was mild regurgitation. Impressions: - Moderate to severe global reduction in LV systolic function; mild LVE; mild LVH; trace AI; mild MR.  Patient Profile     62 y.o. male with PMHx of obesity, s/p gastric sleeve, HTN, DM, persistent AFib admitted for Tikosyn initiation  Assessment & Plan    1. Persistent AFib     CHA2DS2Vasc is 3, on Eliquis, appropriately dosed     K+ 3.8, replacement ordered     Mag 1.7, replaced     Creat 1.10 stable     QTc OK  DCCV tomorrow if not in SR, patient agreeable  2. HTN     Continue home meds  3. DM     Continue home regime  4. CAD     No anginal complaints     contionue home meds    For questions or updates, please contact [place] Please consult [email or web address] for contact info under        Signed, Baldwin Jamaica, PA-C  09/01/2018, 7:18 AM     I have seen, examined the patient, and reviewed the above assessment and plan.  Changes to above are made where necessary.  On exam, iRRR.  Remains in afib.  QT is stable.  Will make NPO after midnight for possible cardioversion tomorrow.  Co Sign: Thompson Grayer, MD 09/01/2018

## 2018-09-01 NOTE — H&P (View-Only) (Signed)
Progress Note  Patient Name: Casey [name]. Date of Encounter: 09/01/2018  Primary Cardiologist: Dr. Tamala Julian  Subjective   Not much sleep last night, feels well, no CP or SOB  Inpatient Medications    Scheduled Meds: . apixaban  5 mg Oral BID  . aspirin EC  81 mg Oral Daily  . carvedilol  12.5 mg Oral BID  . docusate sodium  200 mg Oral BID  . dofetilide  500 mcg Oral BID  . glimepiride  2 mg Oral Q breakfast  . metFORMIN  1,000 mg Oral BID WC  . multivitamin with minerals  1 tablet Oral Daily  . potassium chloride  30 mEq Oral Once  . sacubitril-valsartan  1 tablet Oral BID  . sodium chloride flush  3 mL Intravenous Q12H   Continuous Infusions: . sodium chloride    . magnesium sulfate 1 - 4 g bolus IVPB     PRN Meds: sodium chloride, HYDROcodone-acetaminophen, sodium chloride flush   Vital Signs    Vitals:   08/31/18 1221 08/31/18 2048 09/01/18 0500  BP: (!) 133/103 (!) 127/92 120/79  Pulse: 92 96 85  Resp:  (!) 22 19  Temp: 98.1 F (36.7 C) 98.2 F (36.8 C) 98.8 F (37.1 C)  TempSrc: Oral Oral Oral  SpO2: 99% 100% 100%   No intake or output data in the 24 hours ending 09/01/18 0718 There were no vitals filed for this visit.  Telemetry   AFib, 90's - Personally Reviewed  ECG    AFib  - Personally Reviewed  Physical Exam   GEN: No acute distress.   Neck: No JVD Cardiac: irreg-irreg, no murmurs, rubs, or gallops.  Respiratory: CTA b/l. GI: Soft, nontender, non-distended  MS: No edema; No deformity. Neuro:  Nonfocal  Psych: Normal affect   Labs    Chemistry Recent Labs  Lab 08/31/18 1139 08/31/18 1844 09/01/18 0348  NA 138  --  136  K 3.9 5.0 3.8  CL 100  --  103  CO2 28  --  27  GLUCOSE 193*  --  128*  BUN 12  --  13  CREATININE 1.02  --  1.10  CALCIUM 9.5  --  8.6*  GFRNONAA >60  --  >60  GFRAA >60  --  >60  ANIONGAP 10  --  6     HematologyNo results for input(s): WBC, RBC, HGB, HCT, MCV, MCH, MCHC, RDW, PLT in the last  168 hours.  Cardiac EnzymesNo results for input(s): TROPONINI in the last 168 hours. No results for input(s): TROPIPOC in the last 168 hours.   BNPNo results for input(s): BNP, PROBNP in the last 168 hours.   DDimer No results for input(s): DDIMER in the last 168 hours.   Radiology    No results found.  Cardiac Studies   07/21/18; LHC  Moderate three-vessel coronary artery disease.  60 to 70% mid RCA and 95% stenosis of the distal right coronary beyond the origin of the PDA.  Normal left main  Mid LAD 60 to 70% stenosis beyond the origin of the dominant first diagonal.  Large ramus intermedius with luminal irregularity.  Relatively small distribution circumflex with 70% obstruction in the first marginal and total occlusion of the distal circumflex before the origin of the small second obtuse marginal. There are faint left to left collaterals to the distal circumflex.  Moderately severe left ventricular dysfunction with global hypokinesis, EF 35%. Normal LVEDP. RECOMMENDATIONS:  In absence of significant/limiting anginal  symptoms, would recommend anti-ischemic therapy with beta-blockade and long-acting nitrates. If symptoms develop or become refractory PCI of the very distal RCA, and LAD would be possible. Lesions were not angiographically critical, and therefore based on data from the Courage trial, medical therapy would seem to be adequate.  Recommend guideline directed therapy for systolic heart failure: Transition to Entresto from ARB, transition Tenormin to carvedilol, add mineralocorticoid receptor antagonist (Aldactone or eplerenone). With reference to heart failure, diabetes management should include an SGLT 2 agent to decrease incidence of heart failure symptoms.  Finally it may be helpful to have restoration of sinus rhythm. LV dysfunction is out of proportion to the degree of coronary disease  07/13/18: TTE Study Conclusions - Left ventricle: The cavity size was  mildly dilated. Wall thickness was increased in a pattern of mild LVH. Systolic function was moderately to severely reduced. The estimated ejection fraction was in the range of 30% to 35%. Diffuse hypokinesis. - Aortic valve: There was trivial regurgitation. - Mitral valve: There was mild regurgitation. Impressions: - Moderate to severe global reduction in LV systolic function; mild LVE; mild LVH; trace AI; mild MR.  Patient Profile     62 y.o. male with PMHx of obesity, s/p gastric sleeve, HTN, DM, persistent AFib admitted for Tikosyn initiation  Assessment & Plan    1. Persistent AFib     CHA2DS2Vasc is 3, on Eliquis, appropriately dosed     K+ 3.8, replacement ordered     Mag 1.7, replaced     Creat 1.10 stable     QTc OK  DCCV tomorrow if not in SR, patient agreeable  2. HTN     Continue home meds  3. DM     Continue home regime  4. CAD     No anginal complaints     contionue home meds    For questions or updates, please contact [place] Please consult [email or web address] for contact info under        Signed, Baldwin Jamaica, PA-C  09/01/2018, 7:18 AM     I have seen, examined the patient, and reviewed the above assessment and plan.  Changes to above are made where necessary.  On exam, iRRR.  Remains in afib.  QT is stable.  Will make NPO after midnight for possible cardioversion tomorrow.  Co Sign: Thompson Grayer, MD 09/01/2018

## 2018-09-02 ENCOUNTER — Encounter (HOSPITAL_COMMUNITY): Payer: Self-pay

## 2018-09-02 ENCOUNTER — Ambulatory Visit: Payer: PRIVATE HEALTH INSURANCE | Admitting: Interventional Cardiology

## 2018-09-02 ENCOUNTER — Encounter (HOSPITAL_COMMUNITY)
Admission: AD | Disposition: A | Discharge status: Home / Self Care | Payer: Self-pay | Source: Home / Self Care | Attending: Internal Medicine

## 2018-09-02 ENCOUNTER — Ambulatory Visit (HOSPITAL_COMMUNITY)
Admission: RE | Admit: 2018-09-02 | Payer: PRIVATE HEALTH INSURANCE | Source: Ambulatory Visit | Admitting: Cardiovascular Disease

## 2018-09-02 ENCOUNTER — Inpatient Hospital Stay (HOSPITAL_COMMUNITY): Payer: PRIVATE HEALTH INSURANCE | Admitting: Anesthesiology

## 2018-09-02 HISTORY — PX: CARDIOVERSION: SHX1299

## 2018-09-02 LAB — BASIC METABOLIC PANEL
Anion gap: 7 (ref 5–15)
BUN: 16 mg/dL (ref 6–20)
CALCIUM: 8.2 mg/dL — AB (ref 8.9–10.3)
CO2: 25 mmol/L (ref 22–32)
CREATININE: 0.98 mg/dL (ref 0.61–1.24)
Chloride: 104 mmol/L (ref 98–111)
GFR calc non Af Amer: >60 mL/min (ref >60)
Glucose, Bld: 154 mg/dL — ABNORMAL HIGH (ref 70–99)
Potassium: 3.7 mmol/L (ref 3.5–5.1)
Sodium: 136 mmol/L (ref 135–145)

## 2018-09-02 LAB — GLUCOSE, CAPILLARY
GLUCOSE-CAPILLARY: 144 mg/dL — AB (ref 70–99)
GLUCOSE-CAPILLARY: 125 mg/dL — AB (ref 70–99)
Glucose-Capillary: 117 mg/dL — ABNORMAL HIGH (ref 70–99)
Glucose-Capillary: 154 mg/dL — ABNORMAL HIGH (ref 70–99)

## 2018-09-02 LAB — MAGNESIUM: Magnesium: 1.9 mg/dL (ref 1.7–2.4)

## 2018-09-02 SURGERY — CARDIOVERSION
Anesthesia: General

## 2018-09-02 MED ORDER — MAGNESIUM OXIDE 400 (241.3 MG) MG PO TABS
400.0000 mg | ORAL_TABLET | Freq: Every day | ORAL | Status: DC
  Administered 2018-09-02 – 2018-09-03 (×2): 400 mg via ORAL
  Filled 2018-09-02 (×2): qty 1

## 2018-09-02 MED ORDER — LIDOCAINE 2% (20 MG/ML) 5 ML SYRINGE
INTRAMUSCULAR | Status: DC | PRN
  Administered 2018-09-02: 20 mg via INTRAVENOUS

## 2018-09-02 MED ORDER — POTASSIUM CHLORIDE CRYS ER 20 MEQ PO TBCR
40.0000 meq | EXTENDED_RELEASE_TABLET | Freq: Every day | ORAL | Status: DC
  Administered 2018-09-02: 40 meq via ORAL
  Filled 2018-09-02: qty 2

## 2018-09-02 MED ORDER — PROPOFOL 10 MG/ML IV BOLUS
INTRAVENOUS | Status: DC | PRN
  Administered 2018-09-02: 70 mg via INTRAVENOUS

## 2018-09-02 NOTE — Telephone Encounter (Signed)
Spoke with pt and he stated Dr. Tamala Julian had mentioned possibly changing his Carvedilol at f/u but he had to cancel d/t being admitted for [place] admission.  Rescheduled pt to see Dr. Tamala Julian on 10/9 and advised we could discuss at that time.  Pt appreciative for call.

## 2018-09-02 NOTE — Progress Notes (Addendum)
Progress Note  Patient Name: Casey [name]. Date of Encounter: 09/02/2018  Primary Cardiologist: Dr. Tamala Julian  Subjective   Slept a bit better, feels well, no CP or SOB  Inpatient Medications    Scheduled Meds: . apixaban  5 mg Oral BID  . aspirin EC  81 mg Oral Daily  . carvedilol  12.5 mg Oral BID  . docusate sodium  200 mg Oral BID  . dofetilide  500 mcg Oral BID  . glimepiride  2 mg Oral Q breakfast  . magnesium oxide  400 mg Oral Daily  . metFORMIN  1,000 mg Oral BID WC  . multivitamin with minerals  1 tablet Oral Daily  . potassium chloride  40 mEq Oral Daily  . sacubitril-valsartan  1 tablet Oral BID  . sodium chloride flush  3 mL Intravenous Q12H  . sodium chloride flush  3 mL Intravenous Q12H   Continuous Infusions: . sodium chloride 250 mL (09/01/18 0853)  . sodium chloride     PRN Meds: sodium chloride, HYDROcodone-acetaminophen, hydrocortisone cream, sodium chloride flush, sodium chloride flush   Vital Signs    Vitals:   09/01/18 1434 09/01/18 2107 09/02/18 0512 09/02/18 0515  BP: 103/66 132/88  120/89  Pulse: (!) 103 (!) 103  74  Resp:      Temp: 97.9 F (36.6 C) 98.4 F (36.9 C)  (!) 97.5 F (36.4 C)  TempSrc: Oral Oral  Oral  SpO2: 95% 96%  95%  Weight:   120 kg   Height:   5\' 10"  (1.778 m)     Intake/Output Summary (Last 24 hours) at 09/02/2018 0821 Last data filed at 09/01/2018 2232 Gross per 24 hour  Intake 603 ml  Output -  Net 603 ml   Filed Weights   09/02/18 0512  Weight: 120 kg    Telemetry    AFib, 90's-110's - Personally Reviewed  ECG    AFlutter 111bpm, measured QT 353ms, QT 411ms,   - Personally Reviewed  Physical Exam   Exam is unchanged today GEN: No acute distress.   Neck: No JVD Cardiac: irreg-irreg, no murmurs, rubs, or gallops.  Respiratory: CTA b/l. GI: Soft, nontender, non-distended  MS: No edema; No deformity. Neuro:  Nonfocal  Psych: Normal affect   Labs    Chemistry Recent Labs  Lab  08/31/18 1139 08/31/18 1844 09/01/18 0348 09/02/18 0423  NA 138  --  136 136  K 3.9 5.0 3.8 3.7  CL 100  --  103 104  CO2 28  --  27 25  GLUCOSE 193*  --  128* 154*  BUN 12  --  13 16  CREATININE 1.02  --  1.10 0.98  CALCIUM 9.5  --  8.6* 8.2*  GFRNONAA >60  --  >60 >60  GFRAA >60  --  >60 >60  ANIONGAP 10  --  6 7     HematologyNo results for input(s): WBC, RBC, HGB, HCT, MCV, MCH, MCHC, RDW, PLT in the last 168 hours.  Cardiac EnzymesNo results for input(s): TROPONINI in the last 168 hours. No results for input(s): TROPIPOC in the last 168 hours.   BNPNo results for input(s): BNP, PROBNP in the last 168 hours.   DDimer No results for input(s): DDIMER in the last 168 hours.   Radiology    No results found.  Cardiac Studies   07/21/18; LHC  Moderate three-vessel coronary artery disease.  60 to 70% mid RCA and 95% stenosis of the distal right  coronary beyond the origin of the PDA.  Normal left main  Mid LAD 60 to 70% stenosis beyond the origin of the dominant first diagonal.  Large ramus intermedius with luminal irregularity.  Relatively small distribution circumflex with 70% obstruction in the first marginal and total occlusion of the distal circumflex before the origin of the small second obtuse marginal. There are faint left to left collaterals to the distal circumflex.  Moderately severe left ventricular dysfunction with global hypokinesis, EF 35%. Normal LVEDP. RECOMMENDATIONS:  In absence of significant/limiting anginal symptoms, would recommend anti-ischemic therapy with beta-blockade and long-acting nitrates. If symptoms develop or become refractory PCI of the very distal RCA, and LAD would be possible. Lesions were not angiographically critical, and therefore based on data from the Courage trial, medical therapy would seem to be adequate.  Recommend guideline directed therapy for systolic heart failure: Transition to Entresto from ARB, transition  Tenormin to carvedilol, add mineralocorticoid receptor antagonist (Aldactone or eplerenone). With reference to heart failure, diabetes management should include an SGLT 2 agent to decrease incidence of heart failure symptoms.  Finally it may be helpful to have restoration of sinus rhythm. LV dysfunction is out of proportion to the degree of coronary disease  07/13/18: TTE Study Conclusions - Left ventricle: The cavity size was mildly dilated. Wall thickness was increased in a pattern of mild LVH. Systolic function was moderately to severely reduced. The estimated ejection fraction was in the range of 30% to 35%. Diffuse hypokinesis. - Aortic valve: There was trivial regurgitation. - Mitral valve: There was mild regurgitation. Impressions: - Moderate to severe global reduction in LV systolic function; mild LVE; mild LVH; trace AI; mild MR.  Patient Profile     62 y.o. male with PMHx of obesity, s/p gastric sleeve, HTN, DM, persistent AFib admitted for Tikosyn initiation  Assessment & Plan    1. Persistent AFib     CHA2DS2Vasc is 3, on Eliquis, appropriately dosed     K+ 3.7, replacement ordered     Mag 1.9, replacement ordered     Creat 0.98 stable     QTc OK  DCCV this afternoon Anticipated discharge tomorrow He will need lyte replacement at home  2. HTN     Continue home meds  3. DM     Continue home regime  4. CAD     No anginal complaints     continue home meds    For questions or updates, please contact [place] Please consult [email or web address] for contact info under        Signed, Baldwin Jamaica, PA-C  09/02/2018, 8:21 AM    I have seen, examined the patient, and reviewed the above assessment and plan.  Changes to above are made where necessary.  On exam, iRRR.  Qt is stable.  Plan cardioversion today.  HTN and CAD appear stable at this time.  Anticipate discharge to home tomorrow with follow-up in AF clinic in 1 week.  Co Sign: Thompson Grayer, MD 09/02/2018 2:43 PM

## 2018-09-02 NOTE — Anesthesia Preprocedure Evaluation (Addendum)
Anesthesia Evaluation  Patient identified by MRN, date of birth, ID band Patient awake    Reviewed: Allergy & Precautions, NPO status , Patient's Chart, lab work & pertinent test results  Airway Mallampati: III  TM Distance: >3 FB Neck ROM: Full    Dental  (+) Teeth Intact   Pulmonary former smoker,    breath sounds clear to auscultation       Cardiovascular hypertension,  Rhythm:Irregular Rate:Normal     Neuro/Psych    GI/Hepatic   Endo/Other  diabetes  Renal/GU      Musculoskeletal   Abdominal (+) + obese,   Peds  Hematology   Anesthesia Other Findings   Reproductive/Obstetrics                            Anesthesia Physical Anesthesia Plan  ASA: III  Anesthesia Plan: General   Post-op Pain Management:    Induction: Intravenous  PONV Risk Score and Plan:   Airway Management Planned: Mask  Additional Equipment:   Intra-op Plan:   Post-operative Plan:   Informed Consent: I have reviewed the patients History and Physical, chart, labs and discussed the procedure including the risks, benefits and alternatives for the proposed anesthesia with the patient or authorized representative who has indicated his/her understanding and acceptance.   Dental advisory given  Plan Discussed with: CRNA and Anesthesiologist  Anesthesia Plan Comments:         Anesthesia Quick Evaluation

## 2018-09-02 NOTE — Anesthesia Procedure Notes (Signed)
Procedure Name: General with mask airway Date/Time: 09/02/2018 2:18 PM Performed by: Trinna Post., CRNA Pre-anesthesia Checklist: Patient identified, Emergency Drugs available, Suction available, Patient being monitored and Timeout performed Patient Re-evaluated:Patient Re-evaluated prior to induction Oxygen Delivery Method: Ambu bag Preoxygenation: Pre-oxygenation with 100% oxygen Placement Confirmation: positive ETCO2 Dental Injury: Teeth and Oropharynx as per pre-operative assessment

## 2018-09-02 NOTE — Care Management (Signed)
#    3.  S/W DANA @ [facility]  # [phone]  1. TIKOSYN    125  MCG   250 MCG  500 MCXG BID COVER- YES CO-PAY- ZERO DOLLARS  FOR ALL PRESCRIPTION TIER- NO PRIOR APPROVAL- NO   2.DOFETILIDE   125 MCG   250 MCG   500 MCG BID COVER- YES CO-PAY- ZERO DOLLARS  FOR  ALL PRESCRIPTION TIER- NO PRIOR APPROVAL- NO  PREFERRED PHARMACY : [facility]

## 2018-09-02 NOTE — CV Procedure (Signed)
    Cardioversion Note  Casey [name] [id number] Oct 27, 1956  Procedure: DC Cardioversion Indications: atrial fib   Procedure Details Consent: Obtained Time Out: Verified patient identification, verified procedure, site/side was marked, verified correct patient position, special equipment/implants available, Radiology Safety Procedures followed,  medications/allergies/relevent history reviewed, required imaging and test results available.  Performed  The patient has been on adequate anticoagulation.  The patient received IV Lidocaine 20 mg followed by Propofol 70 mg  for sedation.  Synchronous cardioversion was performed at  200  joules.  The cardioversion was successful.      Complications: No apparent complications Patient did tolerate procedure well.   Casey [name], Casey Bonito., MD, [facility] 09/02/2018, 2:46 PM

## 2018-09-02 NOTE — Interval H&P Note (Signed)
History and Physical Interval Note:  09/02/2018 2:37 PM  Quavion D Jamse Arn.  has presented today for surgery, with the diagnosis of a fib tikosyn admit  The various methods of treatment have been discussed with the patient and family. After consideration of risks, benefits and other options for treatment, the patient has consented to  Procedure(s): CARDIOVERSION (N/A) as a surgical intervention .  The patient's history has been reviewed, patient examined, no change in status, stable for surgery.  I have reviewed the patient's chart and labs.  Questions were answered to the patient's satisfaction.     Mertie Moores

## 2018-09-02 NOTE — Anesthesia Postprocedure Evaluation (Signed)
Anesthesia Post Note  Patient: Casey [name].  Procedure(s) Performed: CARDIOVERSION (N/A )     Patient location during evaluation: PACU Anesthesia Type: General Level of consciousness: awake and alert Pain management: pain level controlled Vital Signs Assessment: post-procedure vital signs reviewed and stable Respiratory status: spontaneous breathing, nonlabored ventilation, respiratory function stable and patient connected to nasal cannula oxygen Cardiovascular status: blood pressure returned to baseline and stable Postop Assessment: no apparent nausea or vomiting Anesthetic complications: no    Last Vitals:  Vitals:   09/02/18 1528 09/02/18 2039  BP: 112/89 (!) 134/91  Pulse: 70 71  Resp: 18   Temp: 36.7 C (!) 36.3 C  SpO2: 95% 97%    Last Pain:  Vitals:   09/02/18 2039  TempSrc: Oral  PainSc:                  Blythe Veach COKER

## 2018-09-02 NOTE — Transfer of Care (Signed)
Immediate Anesthesia Transfer of Care Note  Patient: Lorain Keast.  Procedure(s) Performed: CARDIOVERSION (N/A )  Patient Location: Cath Lab  Anesthesia Type:General  Level of Consciousness: drowsy  Airway & Oxygen Therapy: Patient Spontanous Breathing and Patient connected to nasal cannula oxygen  Post-op Assessment: Report given to RN, Post -op Vital signs reviewed and stable and Patient moving all extremities  Post vital signs: Reviewed and stable  Last Vitals:  Vitals Value Taken Time  BP    Temp    Pulse    Resp    SpO2      Last Pain:  Vitals:   09/02/18 1407  TempSrc: Oral  PainSc: 0-No pain      Patients Stated Pain Goal: 1 (41/28/78 6767)  Complications: No apparent anesthesia complications

## 2018-09-03 LAB — BASIC METABOLIC PANEL
Anion gap: 7 (ref 5–15)
BUN: 16 mg/dL (ref 6–20)
CHLORIDE: 104 mmol/L (ref 98–111)
CO2: 27 mmol/L (ref 22–32)
CREATININE: 0.96 mg/dL (ref 0.61–1.24)
Calcium: 8.1 mg/dL — ABNORMAL LOW (ref 8.9–10.3)
GFR calc non Af Amer: >60 mL/min (ref >60)
Glucose, Bld: 133 mg/dL — ABNORMAL HIGH (ref 70–99)
POTASSIUM: 4.7 mmol/L (ref 3.5–5.1)
SODIUM: 138 mmol/L (ref 135–145)

## 2018-09-03 LAB — GLUCOSE, CAPILLARY
GLUCOSE-CAPILLARY: 120 mg/dL — AB (ref 70–99)
GLUCOSE-CAPILLARY: 142 mg/dL — AB (ref 70–99)

## 2018-09-03 LAB — MAGNESIUM: Magnesium: 1.8 mg/dL (ref 1.7–2.4)

## 2018-09-03 MED ORDER — DOFETILIDE 500 MCG PO CAPS: 500.0000 ug | ORAL_CAPSULE | Freq: Two times a day (BID) | ORAL | 0 refills | Status: DC

## 2018-09-03 MED ORDER — DOFETILIDE 500 MCG PO CAPS: 500.0000 ug | ORAL_CAPSULE | Freq: Two times a day (BID) | ORAL | 6 refills | Status: DC

## 2018-09-03 MED ORDER — MAGNESIUM SULFATE IN D5W 1-5 GM/100ML-% IV SOLN
1.0000 g | Freq: Once | INTRAVENOUS | Status: AC
  Administered 2018-09-03: 1 g via INTRAVENOUS
  Filled 2018-09-03: qty 100

## 2018-09-03 MED FILL — TIKOSYN 500 MCG CAPS: 500 | 7 days supply | Qty: 14 | Fill #0

## 2018-09-03 NOTE — Discharge Summary (Addendum)
ELECTROPHYSIOLOGY PROCEDURE DISCHARGE SUMMARY    Patient ID: Casey [name].,  MRN: [id number], DOB/AGE: 1956/09/09 62 y.o.  Admit date: 08/31/2018 Discharge date: 09/03/2018  Primary Care Physician: Orpah Melter, MD  Primary Cardiologist: Dr. Tamala Julian Electrophysiologist: new, Dr. Rayann Heman  Primary Discharge Diagnosis:  1.  Persistent atrial fibrillation status post Tikosyn loading this admission      CHA2DS2Vasc is 3, on Eliquis, appropriately dosed  Secondary Discharge Diagnosis:  1. HTN 2. DM 3. CAD  Allergies  Allergen Reactions  . Penicillins Hives    Has patient had a PCN reaction causing immediate rash, facial/tongue/throat swelling, SOB or lightheadedness with hypotension: Unknown Has patient had a PCN reaction causing severe rash involving mucus membranes or skin necrosis: Unknown Has patient had a PCN reaction that required hospitalization: No Has patient had a PCN reaction occurring within the last 10 years: No Childhood reaction. If all of the above answers are "NO", then may proceed with Cephalosporin use.   [name] Percocet [Oxycodone-Acetaminophen] Anxiety    Hyperactivity & unhinged.     Procedures This Admission:  1.  Tikosyn loading 2.  Direct current cardioversion on 09/02/18 by Dr Radford Pax which successfully restored SR.  There were no early apparent complications.   Brief HPI: Casey [name]. is a 62 y.o. male with a past medical history as noted above.  He was referred to the AFib clinic in the outpatient setting for treatment options of atrial fibrillation.  Risks, benefits, and alternatives to Tikosyn were reviewed with the patient who wished to proceed.    Hospital Course:  The patient was admitted and Tikosyn was initiated.  Renal function and electrolytes were followed during the hospitalization.  He had potassium and Mag supplementation, though has not been hypokalemic, his potassium by labs routinely >4, his mag initially ok at 1.9, in d/w  Dr. Rayann Heman, no home electrolyte replacement felt needed. His QTc remained stable.  On 09/02/18 they underwent direct current cardioversion which restored sinus rhythm.  The patient was monitored until discharge on telemetry which demonstrated SR.  On the day of discharge, he feels well, the patient was examined by Dr Rayann Heman who considered him stable for discharge to home.  Follow-up has been arranged with the AFib clinic in 1 week and with Dr Rayann Heman in 4 weeks.   7 day supply of Tikosyn Rx was sent to [facility] for home discharge supply  Physical Exam: Vitals:   09/02/18 1455 09/02/18 1528 09/02/18 2039 09/03/18 0449  BP: 92/60 112/89 (!) 134/91 125/77  Pulse: 65 70 71 (!) 58  Resp: 14 18    Temp:  98.1 F (36.7 C) (!) 97.4 F (36.3 C) (!) 97.4 F (36.3 C)  TempSrc:  Oral Oral Oral  SpO2: 97% 95% 97% 97%  Weight:    119.6 kg  Height:        GEN- The patient is well appearing, alert and oriented x 3 today.   HEENT: normocephalic, atraumatic; sclera clear, conjunctiva pink; hearing intact; oropharynx clear; neck supple, no JVP Lymph- no cervical lymphadenopathy Lungs- CTA b/l, normal work of breathing.  No wheezes, rales, rhonchi Heart- RRR, no murmurs, rubs or gallops, PMI not laterally displaced GI- soft, non-tender, non-distended, Extremities- no clubbing, cyanosis, or edema MS- no significant deformity or atrophy Skin- warm and dry, no rash or lesion Psych- euthymic mood, full affect Neuro- strength and sensation are intact   Labs:   Lab Results  Component Value Date   WBC  7.8 08/05/2018   HGB 17.0 08/05/2018   HCT 52.1 (H) 08/05/2018   MCV 93.2 08/05/2018   PLT 206 08/05/2018    Recent Labs  Lab 09/03/18 0439  NA 138  K 4.7  CL 104  CO2 27  BUN 16  CREATININE 0.96  CALCIUM 8.1*  GLUCOSE 133*     Discharge Medications:  Allergies as of 09/03/2018      Reactions   Penicillins Hives   Has patient had a PCN reaction causing immediate rash,  facial/tongue/throat swelling, SOB or lightheadedness with hypotension: Unknown Has patient had a PCN reaction causing severe rash involving mucus membranes or skin necrosis: Unknown Has patient had a PCN reaction that required hospitalization: No Has patient had a PCN reaction occurring within the last 10 years: No Childhood reaction. If all of the above answers are "NO", then may proceed with Cephalosporin use.   Percocet [oxycodone-acetaminophen] Anxiety   Hyperactivity & unhinged.      Medication List    TAKE these medications   apixaban 5 MG Tabs tablet Commonly known as:  ELIQUIS Take 1 tablet (5 mg total) by mouth 2 (two) times daily.   aspirin 81 MG EC tablet Take 81 mg by mouth daily. Swallow whole.   carvedilol 25 MG tablet Commonly known as:  COREG Take 0.5 tablets (12.5 mg total) by mouth 2 (two) times daily.   docusate sodium 100 MG capsule Commonly known as:  COLACE Take 200 mg by mouth 2 (two) times daily.   dofetilide 500 MCG capsule Commonly known as:  TIKOSYN Take 1 capsule (500 mcg total) by mouth 2 (two) times daily.   glimepiride 2 MG tablet Commonly known as:  AMARYL Take 2 mg by mouth daily with breakfast.   HYDROcodone-acetaminophen 10-325 MG tablet Commonly known as:  NORCO Take 1 tablet by mouth every 4 (four) hours as needed (for severe back pain.).   metFORMIN 500 MG 24 hr tablet Commonly known as:  GLUCOPHAGE-XR Take 1,000 mg by mouth 2 (two) times daily.   multivitamin with minerals Tabs tablet Take 1 tablet by mouth daily.   sacubitril-valsartan 24-26 MG Commonly known as:  ENTRESTO Take 1 tablet by mouth 2 (two) times daily.   VICTOZA 18 MG/3ML Sopn Generic drug:  liraglutide Inject 1.8 mg into the skin every evening.       Disposition:  Home  Discharge Instructions    Diet - low sodium heart healthy   Complete by:  As directed    Increase activity slowly   Complete by:  As directed      Follow-up Information    [facility] Follow up on 09/10/2018.   Specialty:  Cardiology Why:  10:00AM Contact information: [address] [id number] [place] [phone]       Thompson Grayer, MD Follow up on 10/06/2018.   Specialty:  Cardiology Why:  10:15AM Contact information: [place] [address] [phone]           Duration of Discharge Encounter: Greater than 30 minutes including physician time.  Signed, Tommye Standard, PA-C 09/03/2018 11:41 AM    I have seen, examined the patient, and reviewed the above assessment and plan.  Changes to above are made where necessary.  On exam, RRR.  Qt is stable DC to home with close follow-up in AF clinic.  Co Sign: Thompson Grayer, MD 09/03/2018 1:51 PM

## 2018-09-03 NOTE — Progress Notes (Signed)
EKG reviewed with Dr. Rayann Heman, QTc stable Remains SR post DCCV Pt feels well, has a hard time sleeping here Anticipate discharge later today after dose #6  Casey Standard, PA-C

## 2018-09-03 NOTE — Care Management Note (Signed)
Case Management Note  Patient Details  Name: Wahid Holley. MRN: [id number] Date of Birth: 07-15-1956  Subjective/Objective:  Pt presented for Tikosyn Load. PTA from home with assistance of wife. Plan to return home 09-03-18. PT uses [facility] in [place] and they can order Tikosyn. Pt is aware of co-pay- has met out of pocket cost. No home needs identified at this time.                 Action/Plan: Please write Rx for 7 day supply to be filled [facility]. Pt will need additional Rx's with refills. No further needs from CM at this time.   Expected Discharge Date:                  Expected Discharge Plan:  Home/Self Care  In-House Referral:  NA  Discharge planning Services  CM Consult, Medication Assistance  Post Acute Care Choice:  NA Choice offered to:  NA  DME Arranged:  N/A DME Agency:  NA  HH Arranged:  NA HH Agency:  NA  Status of Service:  Completed, signed off  If discussed at [place] of Stay Meetings, dates discussed:    Additional Comments:  Bethena Roys, RN 09/03/2018, 10:16 AM

## 2018-09-07 NOTE — Progress Notes (Signed)
Cardiology Office Note:    Date:  09/08/2018   ID:  Casey Garbe., DOB July 21, 1956, MRN [id number]  PCP:  Orpah Melter, MD  Cardiologist:  Sinclair Grooms, MD   Referring MD: Orpah Melter, MD   Chief Complaint  Patient presents with  . Congestive Heart Failure  . Atrial Fibrillation    History of Present Illness:    Casey [name]. is a 62 y.o. male with a hx of atrial fibrillation and coincidental finding of systolic left ventricular dysfunction with EF less than 35%. Recent cardiac catheterization did not reveal significant CAD.   Moderate CAD noted on cath.  No angina.  Interval successful cardioversion after starting dofetilide.  Medical therapy for systolic heart failure has been instituted with the patient currently on carvedilol 12.5 mg twice daily and Entresto 24/26 mg twice daily.  He denies dyspnea.  Now he has had successful cardioversion, he has more exertional tolerance and less shortness of breath.  He has been having blood on toilet tissue.  No easy bruising or bright red blood in the commode.   Past Medical History:  Diagnosis Date  . Arthritis   . Complication of anesthesia    diff. waking up  . Diabetes mellitus   . Headache(784.0)   . Hypertension    dr Marisue Humble   pcp  . Sleep apnea    prior to weight lose surgery-Dr. Don Perking not use CPAP    Past Surgical History:  Procedure Laterality Date  . BACK SURGERY     5  back surgeries  . CARDIOVERSION N/A 08/13/2018   Procedure: CARDIOVERSION;  Surgeon: Larey Dresser, MD;  Location: [facility] ENDOSCOPY;  Service: Cardiovascular;  Laterality: N/A;  . CARDIOVERSION N/A 09/02/2018   Procedure: CARDIOVERSION;  Surgeon: Thayer Headings, MD;  Location: [facility];  Service: Cardiovascular;  Laterality: N/A;  . CARPAL TUNNEL RELEASE     bil  . COLON SURGERY     2004 for colon rupture  . HERNIA REPAIR    . LAPAROSCOPIC GASTRIC BANDING    . LEFT HEART CATH AND CORONARY ANGIOGRAPHY N/A  07/21/2018   Procedure: LEFT HEART CATH AND CORONARY ANGIOGRAPHY;  Surgeon: Belva Crome, MD;  Location: [facility] CV LAB;  Service: Cardiovascular;  Laterality: N/A;  . MANDIBLE FRACTURE SURGERY     x 2  . TONSILLECTOMY      Current Medications: Current Meds  Medication Sig  . apixaban (ELIQUIS) 5 MG TABS tablet Take 1 tablet (5 mg total) by mouth 2 (two) times daily.  . carvedilol (COREG) 25 MG tablet Take 0.5 tablets (12.5 mg total) by mouth 2 (two) times daily.  [name] docusate sodium (COLACE) 100 MG capsule Take 200 mg by mouth 2 (two) times daily.   [name] dofetilide (TIKOSYN) 500 MCG capsule Take 1 capsule (500 mcg total) by mouth 2 (two) times daily.  [name] glimepiride (AMARYL) 2 MG tablet Take 2 mg by mouth daily with breakfast.  . HYDROcodone-acetaminophen (NORCO) 10-325 MG tablet Take 1 tablet by mouth every 4 (four) hours as needed (for severe back pain.).  [name] liraglutide (VICTOZA) 18 MG/3ML SOPN Inject 1.8 mg into the skin every evening.   . metFORMIN (GLUCOPHAGE-XR) 500 MG 24 hr tablet Take 1,000 mg by mouth 2 (two) times daily.  . Multiple Vitamin (MULTIVITAMIN WITH MINERALS) TABS tablet Take 1 tablet by mouth daily.  . [DISCONTINUED] aspirin 81 MG EC tablet Take 81 mg by mouth daily. Swallow whole.   . [  DISCONTINUED] sacubitril-valsartan (ENTRESTO) 24-26 MG Take 1 tablet by mouth 2 (two) times daily.     Allergies:   Penicillins and Percocet [oxycodone-acetaminophen]   Social History   Socioeconomic History  . Marital status: Married    Spouse name: Not on file  . Number of children: Not on file  . Years of education: Not on file  . Highest education level: Not on file  Occupational History  . Not on file  Social Needs  . Financial resource strain: Not on file  . Food insecurity:    Worry: Not on file    Inability: Not on file  . Transportation needs:    Medical: Not on file    Non-medical: Not on file  Tobacco Use  . Smoking status: Former Smoker    Packs/day: 0.90      Years: 15.00    Pack years: 13.50    Types: Cigarettes    Last attempt to quit: 01/19/2006    Years since quitting: 12.6  . Smokeless tobacco: Never Used  Substance and Sexual Activity  . Alcohol use: No  . Drug use: No  . Sexual activity: Not on file  Lifestyle  . Physical activity:    Days per week: Not on file    Minutes per session: Not on file  . Stress: Not on file  Relationships  . Social connections:    Talks on phone: Not on file    Gets together: Not on file    Attends religious service: Not on file    Active member of club or organization: Not on file    Attends meetings of clubs or organizations: Not on file    Relationship status: Not on file  Other Topics Concern  . Not on file  Social History Narrative  . Not on file     Family History: The patient's family history includes Diabetes in his maternal grandfather. There is no history of Heart disease.  ROS:   Please see the history of present illness.    Blood in stool, abdominal pain, diarrhea, leg pain.  The abdominal pain is similar to that he has had in the past related to hernia surgery.  He has also had bariatric surgery.  He does not believe this particular complaint is related to recently initiated medications.  All other systems reviewed and are negative.  EKGs/Labs/Other Studies Reviewed:    The following studies were reviewed today: No new data  EKG:  EKG is not ordered today.    Recent Labs: 08/05/2018: Hemoglobin 17.0; Platelets 206 09/03/2018: BUN 16; Creatinine, Ser 0.96; Magnesium 1.8; Potassium 4.7; Sodium 138  Recent Lipid Panel No results found for: CHOL, TRIG, HDL, CHOLHDL, VLDL, LDLCALC, LDLDIRECT  Physical Exam:    VS:  BP 140/88   Pulse (!) 59   Ht 5\' 10"  (1.778 m)   Wt 263 lb 6.4 oz (119.5 kg)   BMI 37.79 kg/m     Wt Readings from Last 3 Encounters:  09/08/18 263 lb 6.4 oz (119.5 kg)  09/03/18 263 lb 11.2 oz (119.6 kg)  08/31/18 265 lb (120.2 kg)     GEN: Obese but  well developed in no acute distress HEENT: Normal NECK: No JVD. LYMPHATICS: No lymphadenopathy CARDIAC: RRR, no murmur, S4 gallop, no edema. VASCULAR: 2+ bilateral carotid and radial bilateral pulses.  No bruits. RESPIRATORY:  Clear to auscultation without rales, wheezing or rhonchi  ABDOMEN: Soft, non-tender, non-distended, No pulsatile mass, MUSCULOSKELETAL: No deformity  SKIN: Warm and dry  NEUROLOGIC:  Alert and oriented x 3 PSYCHIATRIC:  Normal affect   ASSESSMENT:    1. Chronic systolic heart failure ([place])   2. Coronary artery disease involving native coronary artery of native heart without angina pectoris   3. Chronic anticoagulation   4. Persistent atrial fibrillation   5. Uncontrolled type 2 diabetes mellitus with hyperglycemia, without long-term current use of insulin ([place])   6. Visit for monitoring Tikosyn therapy    PLAN:    In order of problems listed above:  1. No evidence of volume overload.  Heart rate is 59 bpm.  Will uptitrate Entresto to 49/51 mg tablets p.o. twice daily.  Basic metabolic panel in 2 weeks.  Has not required diuretic therapy.  It is highly likely that systolic dysfunction is tachycardia related.  Reassessment of LV function will be done in late December to reassess LV function after 90 days of guideline directed medical therapy to improve LV function. 2. No anginal complaints.  Secondary prevention to include A1c less than 7, systolic blood pressure 959/74 mmHg or less, 150 minutes of aerobic activity per week, and LDL less than 70.  Will discontinue aspirin therapy. 3. Continue Eliquis long-term.  Aspirin being discontinued. 4. Clinically maintaining normal sinus rhythm.  Currently on dofetilide.  Sinus bradycardia 59 bpm therefore will not further uptitrate carvedilol therapy.  5. Hemoglobin A1c target less than 7.  Increase aerobic activity.  Consider SGLT2 therapy (-flozin) to decrease cardiac events.   Overall he is doing well.  Is having good  medication tolerance.  He is having some abdominal discomfort and has seen some blood in his stool.  Aspirin is being discontinued.  Follow-up with EP in 1 to 2 weeks.  Basic metabolic panel in 2 weeks.  This will be drawn in [place] and results sent to me.  69-month follow-up repeat echocardiogram.     Medication Adjustments/Labs and Tests Ordered: Current medicines are reviewed at length with the patient today.  Concerns regarding medicines are outlined above.  Orders Placed This Encounter  Procedures  . Basic metabolic panel   Meds ordered this encounter  Medications  . sacubitril-valsartan (ENTRESTO) 49-51 MG    Sig: Take 1 tablet by mouth 2 (two) times daily.    Dispense:  60 tablet    Refill:  6    Dose increase    There are no Patient Instructions on file for this visit.   Signed, Sinclair Grooms, MD  09/08/2018 10:47 AM    [place]

## 2018-09-08 ENCOUNTER — Encounter: Payer: Self-pay | Admitting: Interventional Cardiology

## 2018-09-08 ENCOUNTER — Ambulatory Visit (INDEPENDENT_AMBULATORY_CARE_PROVIDER_SITE_OTHER): Payer: PRIVATE HEALTH INSURANCE | Admitting: Interventional Cardiology

## 2018-09-08 VITALS — BP 140/88 | HR 59 | Ht 70.0 in | Wt 263.4 lb

## 2018-09-08 DIAGNOSIS — I4819 Other persistent atrial fibrillation: Secondary | ICD-10-CM

## 2018-09-08 DIAGNOSIS — Z7901 Long term (current) use of anticoagulants: Secondary | ICD-10-CM | POA: Diagnosis not present

## 2018-09-08 DIAGNOSIS — Z5181 Encounter for therapeutic drug level monitoring: Secondary | ICD-10-CM

## 2018-09-08 DIAGNOSIS — I2510 Atherosclerotic heart disease of native coronary artery without angina pectoris: Secondary | ICD-10-CM | POA: Diagnosis not present

## 2018-09-08 DIAGNOSIS — Z79899 Other long term (current) drug therapy: Secondary | ICD-10-CM

## 2018-09-08 DIAGNOSIS — E1165 Type 2 diabetes mellitus with hyperglycemia: Secondary | ICD-10-CM

## 2018-09-08 DIAGNOSIS — I5022 Chronic systolic (congestive) heart failure: Secondary | ICD-10-CM | POA: Diagnosis not present

## 2018-09-08 MED ORDER — SACUBITRIL-VALSARTAN 49-51 MG PO TABS: 1.0000 | ORAL_TABLET | Freq: Two times a day (BID) | ORAL | 6 refills | Status: DC

## 2018-09-08 NOTE — Patient Instructions (Addendum)
Medication Instructions:  Your physician has recommended you make the following change in your medication:  1.) stop aspirin 2.) increase Entresto to 49/51 mg tablets twice daily  If you need a refill on your cardiac medications before your next appointment, please call your pharmacy.   Lab work: In 10-14 days come for blood work (BMET)--PRESCRIPTION GIVEN FOR PATIENT TO HAVE DONE AT [facility] PCP--results to be faxed to Dr. Tamala Julian.  If you have labs (blood work) drawn today and your tests are completely normal, you will receive your results only by: [name] MyChart Message (if you have MyChart) OR . A paper copy in the mail If you have any lab test that is abnormal or we need to change your treatment, we will call you to review the results.  Testing/Procedures: Your physician has requested that you have an echocardiogram. Echocardiography is a painless test that uses sound waves to create images of your heart. It provides your doctor with information about the size and shape of your heart and how well your heart's chambers and valves are working. This procedure takes approximately one hour. There are no restrictions for this procedure. PLEASE SCHEDULE END OF December, 2020.   Follow-Up: At [facility], you and your health needs are our priority.  As part of our continuing mission to provide you with exceptional heart care, we have created designated Provider Care Teams.  These Care Teams include your primary Cardiologist (physician) and Advanced Practice Providers (APPs -  Physician Assistants and Nurse Practitioners) who all work together to provide you with the care you need, when you need it. Your physician recommends that you schedule a follow-up appointment in: 2-4 months with Dr. Tamala Julian   Any Other Special Instructions Will Be Listed Below (If Applicable). none

## 2018-09-10 ENCOUNTER — Ambulatory Visit (HOSPITAL_COMMUNITY)
Admission: RE | Admit: 2018-09-10 | Discharge: 2018-09-10 | Disposition: A | Discharge status: Home / Self Care | Payer: PRIVATE HEALTH INSURANCE | Source: Ambulatory Visit | Attending: Nurse Practitioner | Admitting: Nurse Practitioner

## 2018-09-10 ENCOUNTER — Encounter (HOSPITAL_COMMUNITY): Payer: Self-pay | Admitting: Nurse Practitioner

## 2018-09-10 VITALS — BP 142/84 | HR 52 | Ht 70.0 in | Wt 263.0 lb

## 2018-09-10 DIAGNOSIS — Z7901 Long term (current) use of anticoagulants: Secondary | ICD-10-CM | POA: Insufficient documentation

## 2018-09-10 DIAGNOSIS — I10 Essential (primary) hypertension: Secondary | ICD-10-CM | POA: Insufficient documentation

## 2018-09-10 DIAGNOSIS — E119 Type 2 diabetes mellitus without complications: Secondary | ICD-10-CM | POA: Diagnosis not present

## 2018-09-10 DIAGNOSIS — Z880 Allergy status to penicillin: Secondary | ICD-10-CM | POA: Diagnosis not present

## 2018-09-10 DIAGNOSIS — Z87891 Personal history of nicotine dependence: Secondary | ICD-10-CM | POA: Insufficient documentation

## 2018-09-10 DIAGNOSIS — I2510 Atherosclerotic heart disease of native coronary artery without angina pectoris: Secondary | ICD-10-CM | POA: Diagnosis not present

## 2018-09-10 DIAGNOSIS — I4819 Other persistent atrial fibrillation: Secondary | ICD-10-CM | POA: Diagnosis present

## 2018-09-10 DIAGNOSIS — Z79899 Other long term (current) drug therapy: Secondary | ICD-10-CM | POA: Diagnosis not present

## 2018-09-10 DIAGNOSIS — Z9889 Other specified postprocedural states: Secondary | ICD-10-CM | POA: Insufficient documentation

## 2018-09-10 DIAGNOSIS — Z7984 Long term (current) use of oral hypoglycemic drugs: Secondary | ICD-10-CM | POA: Diagnosis not present

## 2018-09-10 LAB — BASIC METABOLIC PANEL
ANION GAP: 8 (ref 5–15)
BUN: 11 mg/dL (ref 6–20)
CALCIUM: 9.5 mg/dL (ref 8.9–10.3)
CO2: 28 mmol/L (ref 22–32)
Chloride: 103 mmol/L (ref 98–111)
Creatinine, Ser: 0.89 mg/dL (ref 0.61–1.24)
GLUCOSE: 132 mg/dL — AB (ref 70–99)
Potassium: 4 mmol/L (ref 3.5–5.1)
Sodium: 139 mmol/L (ref 135–145)

## 2018-09-10 LAB — MAGNESIUM: Magnesium: 1.9 mg/dL (ref 1.7–2.4)

## 2018-09-10 NOTE — Progress Notes (Signed)
Primary Care Physician: Orpah Melter, MD Referring Physician: Dr. Fransico Him   Casey [name]. is a 62 y.o. male with a h/o obesity, s/p gastric sleeve, HTN, DM that presented for pre op for rt parotidectomy 8/8 and was found to be in rate controlled afib, from which he was asymptomatic. He was sent to the afib clinic for evaluation. 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 He has felt some fatigue, less stamina for several months. Was just seen by PCP yesterday and nothing out of the ordinary was noted with his heart rhythm. He denies any exertional chest pain.   He does not drink alcohol, no tobacco use,no excessive caffeine. He has kept his weight stable for several years. Was almost 100 lbs heavier before gastric sleeve.  I discussed with Dr. Rayann Heman and he recommended an echo and if ok, he would be considered at low risk for surgery and could start anticoagulation after surgery. However, pt is now back in the office as his echo did show a reduced EF at 30-35%. Dr. Rayann Heman discussed with Dr. Redmond Baseman and it is felt most appropriate  to delay surgery in order to obtain  LHC to further determine his risk for surgery. He does have cardiac risk factors for CAD , ie , obesity,  HTN, DM. He denies any exertional chest pain but c/o fatigue/ low stamina  for several months/early summer. LHC did show 3 vessel  moderate  CAD  F/u in afib clinic, he is now been on anticoagulation x 2 weeks without interruption and can now schedule cardioversion after another week, he is in agreement.  F/u in afib clinic,9/20, he unfortunately had unsuccessful cardioversion and is back in the clinic to discuss antiarrythmic's.It was decided that he would come into the hospital for Tikosyn after he checked on the price of the drug.  F/u in afib clinic, 10/1, for admission for Tikosyn.  He will get the drug free for the rest  of the year as he had met his out of pocket deductible.   F/u in afib clinic 10/11. He is staying in [place] on [place]. He feels improved. His reduced EF meds have been titrated by Dr. Tamala [name].  Today, he denies symptoms of palpitations, chest pain, shortness of breath, orthopnea, PND, lower extremity edema, dizziness, presyncope, syncope, or neurologic sequela.  The patient is tolerating medications without difficulties and is otherwise without complaint today.   Past Medical History:  Diagnosis Date  . Arthritis   . Complication of anesthesia    diff. waking up  . Diabetes mellitus   . Headache(784.0)   . Hypertension    dr Casey [name]   pcp  . Sleep apnea    prior to weight lose surgery-Dr. Don [name] not use CPAP   Past Surgical History:  Procedure Laterality Date  . BACK SURGERY     5  back surgeries  . CARDIOVERSION N/A 08/13/2018   Procedure: CARDIOVERSION;  Surgeon: Larey Dresser, MD;  Location: [facility] ENDOSCOPY;  Service: Cardiovascular;  Laterality: N/A;  . CARDIOVERSION N/A 09/02/2018   Procedure: CARDIOVERSION;  Surgeon: Thayer Headings, MD;  Location: [facility];  Service: Cardiovascular;  Laterality: N/A;  . CARPAL TUNNEL RELEASE     bil  . COLON SURGERY     2004 for colon rupture  . HERNIA REPAIR    . LAPAROSCOPIC GASTRIC BANDING    .  LEFT HEART CATH AND CORONARY ANGIOGRAPHY N/A 07/21/2018   Procedure: LEFT HEART CATH AND CORONARY ANGIOGRAPHY;  Surgeon: Belva Crome, MD;  Location: [facility] CV LAB;  Service: Cardiovascular;  Laterality: N/A;  . MANDIBLE FRACTURE SURGERY     x 2  . TONSILLECTOMY      Current Outpatient Medications  Medication Sig Dispense Refill  . apixaban (ELIQUIS) 5 MG TABS tablet Take 1 tablet (5 mg total) by mouth 2 (two) times daily. 60 tablet 3  . carvedilol (COREG) 25 MG tablet Take 0.5 tablets (12.5 mg total) by mouth 2 (two) times daily. 60 tablet 3  . docusate sodium (COLACE) 100 MG capsule Take 200 mg by mouth 2 (two) times daily.      [name] dofetilide (TIKOSYN) 500 MCG capsule Take 1 capsule (500 mcg total) by mouth 2 (two) times daily. 60 capsule 6  . glimepiride (AMARYL) 2 MG tablet Take 2 mg by mouth daily with breakfast.    . HYDROcodone-acetaminophen (NORCO) 10-325 MG tablet Take 1 tablet by mouth every 4 (four) hours as needed (for severe back pain.).    [name] liraglutide (VICTOZA) 18 MG/3ML SOPN Inject 1.8 mg into the skin every evening.     . metFORMIN (GLUCOPHAGE-XR) 500 MG 24 hr tablet Take 1,000 mg by mouth 2 (two) times daily.  1  . Multiple Vitamin (MULTIVITAMIN WITH MINERALS) TABS tablet Take 1 tablet by mouth daily.    . sacubitril-valsartan (ENTRESTO) 49-51 MG Take 1 tablet by mouth 2 (two) times daily. 60 tablet 6   No current facility-administered medications for this encounter.     Allergies  Allergen Reactions  . Penicillins Hives    Has patient had a PCN reaction causing immediate rash, facial/tongue/throat swelling, SOB or lightheadedness with hypotension: Unknown Has patient had a PCN reaction causing severe rash involving mucus membranes or skin necrosis: Unknown Has patient had a PCN reaction that required hospitalization: No Has patient had a PCN reaction occurring within the last 10 years: No Childhood reaction. If all of the above answers are "NO", then may proceed with Cephalosporin use.   [name] Percocet [Oxycodone-Acetaminophen] Anxiety    Hyperactivity & unhinged.    Social History   Socioeconomic History  . Marital status: Married    Spouse name: Not on file  . Number of children: Not on file  . Years of education: Not on file  . Highest education level: Not on file  Occupational History  . Not on file  Social Needs  . Financial resource strain: Not on file  . Food insecurity:    Worry: Not on file    Inability: Not on file  . Transportation needs:    Medical: Not on file    Non-medical: Not on file  Tobacco Use  . Smoking status: Former Smoker    Packs/day: 0.90    Years:  15.00    Pack years: 13.50    Types: Cigarettes    Last attempt to quit: 01/19/2006    Years since quitting: 12.6  . Smokeless tobacco: Never Used  Substance and Sexual Activity  . Alcohol use: No  . Drug use: No  . Sexual activity: Not on file  Lifestyle  . Physical activity:    Days per week: Not on file    Minutes per session: Not on file  . Stress: Not on file  Relationships  . Social connections:    Talks on phone: Not on file    Gets together: Not on file  Attends religious service: Not on file    Active member of club or organization: Not on file    Attends meetings of clubs or organizations: Not on file    Relationship status: Not on file  . Intimate partner violence:    Fear of current or ex partner: Not on file    Emotionally abused: Not on file    Physically abused: Not on file    Forced sexual activity: Not on file  Other Topics Concern  . Not on file  Social History Narrative  . Not on file    Family History  Problem Relation Age of Onset  . Diabetes Maternal Grandfather   . Heart disease Neg Hx     ROS- All systems are reviewed and negative except as per the HPI above  Physical Exam: Vitals:   09/10/18 1014  BP: (!) 142/84  Pulse: (!) 52  Weight: 119.3 kg  Height: 5' 10"  (1.778 m)   Wt Readings from Last 3 Encounters:  09/10/18 119.3 kg  09/08/18 119.5 kg  09/03/18 119.6 kg    Labs: Lab Results  Component Value Date   NA 138 09/03/2018   K 4.7 09/03/2018   CL 104 09/03/2018   CO2 27 09/03/2018   GLUCOSE 133 (H) 09/03/2018   BUN 16 09/03/2018   CREATININE 0.96 09/03/2018   CALCIUM 8.1 (L) 09/03/2018   MG 1.8 09/03/2018   No results found for: INR No results found for: CHOL, HDL, LDLCALC, TRIG   GEN- The patient is well appearing, alert and oriented x 3 today.   Head- normocephalic, atraumatic Eyes-  Sclera clear, conjunctiva pink Ears- hearing intact Oropharynx- clear Neck- supple, no JVP Lymph- no cervical  lymphadenopathy Lungs- Clear to ausculation bilaterally, normal work of breathing Heart-regular rate and rhythm, no murmurs, rubs or gallops, PMI not laterally displaced GI- soft, NT, ND, + BS Extremities- no clubbing, cyanosis, or edema MS- no significant deformity or atrophy Skin- no rash or lesion Psych- euthymic mood, full affect Neuro- strength and sensation are intact  EKG- NSR at 52 bpm, pr int 186 ms, qrs int 98 ms, qtc 446 ms Epic records reviewed Echo-Study Conclusions  - Left ventricle: The cavity size was mildly dilated. Wall   thickness was increased in a pattern of mild LVH. Systolic   function was moderately to severely reduced. The estimated   ejection fraction was in the range of 30% to 35%. Diffuse   hypokinesis. - Aortic valve: There was trivial regurgitation. - Mitral valve: There was mild regurgitation.  Impressions:  - Moderate to severe global reduction in LV systolic function; mild   LVE; mild LVH; trace AI; mild MR.  LHC- 8/21-Moderate three-vessel coronary artery disease.  60 to 70% mid RCA and 95% stenosis of the distal right coronary beyond the origin of the PDA.  Normal left main  Mid LAD 60 to 70% stenosis beyond the origin of the dominant first diagonal.  Large ramus intermedius with luminal irregularity.  Relatively small distribution circumflex with 70% obstruction in the first marginal and total occlusion of the distal circumflex before the origin of the small second obtuse marginal.  There are faint left to left collaterals to the distal circumflex.  Moderately severe left ventricular dysfunction with global hypokinesis, EF 35%.  Normal LVEDP.    RECOMMENDATIONS:   In absence of significant/limiting anginal symptoms, would recommend anti-ischemic therapy with beta-blockade and long-acting nitrates.  If symptoms develop or become refractory PCI of the very distal RCA,  and LAD would be possible.  Lesions were not angiographically  critical, and therefore based on data from the Courage trial, medical therapy would seem to be adequate.  Recommend guideline directed therapy for systolic heart failure: Transition to Entresto from ARB, transition Tenormin to carvedilol, add mineralocorticoid receptor antagonist (Aldactone or eplerenone).  With reference to heart failure, diabetes management should include an SGLT 2 agent to decrease incidence of heart failure symptoms.  Finally it may be helpful to have restoration of sinus rhythm.  LV dysfunction is out of proportion to the degree of coronary disease.   Recommend Aspirin 52m daily for moderate CAD.  Assessment and Plan: 1. Persistent  Afib Unfortunately had unsuccessful cardioversion Now in rhythm with tikosyn Continue drug at 500 mcg bid Parotid gland surgery is on hold until pt is settled from afib and CAD standpoint Bmet/mag today Sleep study ordered for snoring and untreated sleep apnea by sleep study in the past  2.CHA2DS2VASc score of 2(htn, DM)  Continue eliquis 5 mg bid,states no missed doses  3. CAD / LV dysfunction Recently started on entresto and atenolol changed to  Carvedilol, dose held at 12.5 mg bid for HR in the 50's ASA stopped due to recent blood noted in stool per Dr. STamala JulianStatin stopped per endocrinologist months ago due to numbers looking good He does see this MD soon and now with know CAD, he would benefit to restart drug He will discuss with him then Repeat echo pending in December  F/u Dr. ARayann Heman11/6, Dr. STamala Julian1/8  DButch PennyC. Jerlisa Diliberto, ALoreau[facility] Hospital[address] CourtGNorcatur [place] [phone]

## 2018-09-16 ENCOUNTER — Telehealth: Payer: Self-pay | Admitting: *Deleted

## 2018-09-16 NOTE — Telephone Encounter (Signed)
Called [employer] to obtain PA for in lab sleep study. Study was approved for 11/05/18. Auth # [id number].

## 2018-09-27 ENCOUNTER — Encounter: Payer: Self-pay | Admitting: *Deleted

## 2018-09-28 ENCOUNTER — Other Ambulatory Visit: Payer: Self-pay | Admitting: *Deleted

## 2018-09-28 ENCOUNTER — Other Ambulatory Visit: Payer: PRIVATE HEALTH INSURANCE

## 2018-09-28 ENCOUNTER — Other Ambulatory Visit: Payer: Self-pay

## 2018-09-28 DIAGNOSIS — I4819 Other persistent atrial fibrillation: Secondary | ICD-10-CM

## 2018-09-28 DIAGNOSIS — Z79899 Other long term (current) drug therapy: Secondary | ICD-10-CM

## 2018-09-28 DIAGNOSIS — Z5181 Encounter for therapeutic drug level monitoring: Secondary | ICD-10-CM

## 2018-09-28 DIAGNOSIS — E1165 Type 2 diabetes mellitus with hyperglycemia: Secondary | ICD-10-CM

## 2018-09-28 DIAGNOSIS — I5022 Chronic systolic (congestive) heart failure: Secondary | ICD-10-CM

## 2018-09-28 LAB — BASIC METABOLIC PANEL WITH GFR
BUN/Creatinine Ratio: 16 (ref 10–24)
BUN: 14 mg/dL (ref 8–27)
CO2: 24 mmol/L (ref 20–29)
Calcium: 9.1 mg/dL (ref 8.6–10.2)
Chloride: 101 mmol/L (ref 96–106)
Creatinine, Ser: 0.89 mg/dL (ref 0.76–1.27)
GFR calc Af Amer: 107 mL/min/1.73
GFR calc non Af Amer: 93 mL/min/1.73
Glucose: 157 mg/dL — ABNORMAL HIGH (ref 65–99)
Potassium: 4.3 mmol/L (ref 3.5–5.2)
Sodium: 141 mmol/L (ref 134–144)

## 2018-10-01 ENCOUNTER — Telehealth: Payer: Self-pay | Admitting: Interventional Cardiology

## 2018-10-01 DIAGNOSIS — I5022 Chronic systolic (congestive) heart failure: Secondary | ICD-10-CM

## 2018-10-01 MED ORDER — SPIRONOLACTONE 25 MG PO TABS: 12.5000 mg | ORAL_TABLET | Freq: Every day | ORAL | 3 refills | Status: DC

## 2018-10-01 NOTE — Telephone Encounter (Signed)
Add spironolactone 12.5 mg daily and get BMET 5-7 days.

## 2018-10-01 NOTE — Telephone Encounter (Signed)
Casey Crome, MD at 10/01/2018 4:19 PM   Status: Signed    Start aldactone 12.5 mg daily. BMET 5-7 days. Continue monitoring.     Spoke with pt and went over recommendations per Dr. Tamala Julian.  Pt agreeable to plan.  Pt in the office 11/6 to see Dr. Rayann Heman.  Will plan to do labs same day.  Pt appreciative for call.

## 2018-10-01 NOTE — Telephone Encounter (Signed)
Start aldactone 12.5 mg daily. BMET 5-7 days. Continue monitoring.

## 2018-10-06 ENCOUNTER — Encounter: Payer: Self-pay | Admitting: Internal Medicine

## 2018-10-06 ENCOUNTER — Ambulatory Visit (INDEPENDENT_AMBULATORY_CARE_PROVIDER_SITE_OTHER): Payer: PRIVATE HEALTH INSURANCE | Admitting: Internal Medicine

## 2018-10-06 ENCOUNTER — Other Ambulatory Visit: Payer: PRIVATE HEALTH INSURANCE | Admitting: *Deleted

## 2018-10-06 VITALS — BP 144/90 | HR 62 | Ht 70.0 in | Wt 260.6 lb

## 2018-10-06 DIAGNOSIS — I4819 Other persistent atrial fibrillation: Secondary | ICD-10-CM

## 2018-10-06 DIAGNOSIS — I2510 Atherosclerotic heart disease of native coronary artery without angina pectoris: Secondary | ICD-10-CM | POA: Diagnosis not present

## 2018-10-06 DIAGNOSIS — I10 Essential (primary) hypertension: Secondary | ICD-10-CM

## 2018-10-06 DIAGNOSIS — I5022 Chronic systolic (congestive) heart failure: Secondary | ICD-10-CM

## 2018-10-06 LAB — BASIC METABOLIC PANEL
BUN / CREAT RATIO: 16 (ref 10–24)
BUN: 15 mg/dL (ref 8–27)
CHLORIDE: 99 mmol/L (ref 96–106)
CO2: 24 mmol/L (ref 20–29)
Calcium: 9.9 mg/dL (ref 8.6–10.2)
Creatinine, Ser: 0.93 mg/dL (ref 0.76–1.27)
GFR calc Af Amer: 103 mL/min/{1.73_m2} (ref >59)
GFR calc non Af Amer: 89 mL/min/{1.73_m2} (ref >59)
GLUCOSE: 139 mg/dL — AB (ref 65–99)
POTASSIUM: 4.2 mmol/L (ref 3.5–5.2)
SODIUM: 140 mmol/L (ref 134–144)

## 2018-10-06 NOTE — Patient Instructions (Addendum)
Medication Instructions:  Your physician recommends that you continue on your current medications as directed. Please refer to the Current Medication list given to you today.  If you need a refill on your cardiac medications before your next appointment, please call your pharmacy.   Lab work: TODAY: BMET (ordered by Dr. Tamala Julian), MG  If you have labs (blood work) drawn today and your tests are completely normal, you will receive your results only by: [name] MyChart Message (if you have MyChart) OR . A paper copy in the mail If you have any lab test that is abnormal or we need to change your treatment, we will call you to review the results.  Testing/Procedures: None ordered  Follow-Up: . Your physician recommends that you schedule a follow-up appointment in: 3 months with Roderic Palau, NP.   Any Other Special Instructions Will Be Listed Below (If Applicable).

## 2018-10-06 NOTE — Progress Notes (Signed)
PCP: Orpah Melter, MD Primary Cardiologist: Dr Tamala Julian Primary EP: Dr Rayann Heman  Dietrich Pates Casey [name]. is a 62 y.o. male who presents today for routine electrophysiology followup.  Since his recent tikosyn load, the patient reports doing very well. Energy is much improved with sinus rhythm.  Today, he denies symptoms of palpitations, chest pain, shortness of breath,  lower extremity edema, dizziness, presyncope, or syncope.  The patient is otherwise without complaint today.   Past Medical History:  Diagnosis Date  . Arthritis   . Complication of anesthesia    diff. waking up  . Diabetes mellitus   . Headache(784.0)   . Hypertension    dr Marisue Humble   pcp  . Sleep apnea    prior to weight lose surgery-Dr. Don Perking not use CPAP   Past Surgical History:  Procedure Laterality Date  . BACK SURGERY     5  back surgeries  . CARDIOVERSION N/A 08/13/2018   Procedure: CARDIOVERSION;  Surgeon: Larey Dresser, MD;  Location: [facility] ENDOSCOPY;  Service: Cardiovascular;  Laterality: N/A;  . CARDIOVERSION N/A 09/02/2018   Procedure: CARDIOVERSION;  Surgeon: Thayer Headings, MD;  Location: [facility];  Service: Cardiovascular;  Laterality: N/A;  . CARPAL TUNNEL RELEASE     bil  . COLON SURGERY     2004 for colon rupture  . HERNIA REPAIR    . LAPAROSCOPIC GASTRIC BANDING    . LEFT HEART CATH AND CORONARY ANGIOGRAPHY N/A 07/21/2018   Procedure: LEFT HEART CATH AND CORONARY ANGIOGRAPHY;  Surgeon: Belva Crome, MD;  Location: [facility] CV LAB;  Service: Cardiovascular;  Laterality: N/A;  . MANDIBLE FRACTURE SURGERY     x 2  . TONSILLECTOMY      ROS- all systems are reviewed and negatives except as per HPI above  Current Outpatient Medications  Medication Sig Dispense Refill  . apixaban (ELIQUIS) 5 MG TABS tablet Take 1 tablet (5 mg total) by mouth 2 (two) times daily. 60 tablet 3  . carvedilol (COREG) 25 MG tablet Take 0.5 tablets (12.5 mg total) by mouth 2 (two) times daily. 60 tablet  3  . docusate sodium (COLACE) 100 MG capsule Take 200 mg by mouth 2 (two) times daily.     Casey [name] dofetilide (TIKOSYN) 500 MCG capsule Take 1 capsule (500 mcg total) by mouth 2 (two) times daily. 60 capsule 6  . glimepiride (AMARYL) 2 MG tablet Take 2 mg by mouth daily with breakfast.    . HYDROcodone-acetaminophen (NORCO) 10-325 MG tablet Take 1 tablet by mouth every 4 (four) hours as needed (for severe back pain.).    Casey [name] liraglutide (VICTOZA) 18 MG/3ML SOPN Inject 1.8 mg into the skin every evening.     . metFORMIN (GLUCOPHAGE-XR) 500 MG 24 hr tablet Take 1,000 mg by mouth 2 (two) times daily.  1  . Multiple Vitamin (MULTIVITAMIN WITH MINERALS) TABS tablet Take 1 tablet by mouth daily.    . sacubitril-valsartan (ENTRESTO) 49-51 MG Take 1 tablet by mouth 2 (two) times daily. 60 tablet 6  . spironolactone (ALDACTONE) 25 MG tablet Take 0.5 tablets (12.5 mg total) by mouth daily. 45 tablet 3  . atorvastatin (LIPITOR) 10 MG tablet Take 10 mg by mouth daily.  1   No current facility-administered medications for this visit.     Physical Exam: Vitals:   10/06/18 1049  BP: (!) 144/90  Pulse: 62  SpO2: 96%  Weight: 260 lb 9.6 oz (118.2 kg)  Height: 5\' 10"  (1.778 m)  GEN- The patient is well appearing, alert and oriented x 3 today.   Head- normocephalic, atraumatic, R parotid tumor noted Eyes-  Sclera clear, conjunctiva pink Ears- hearing intact Oropharynx- clear Lungs- Clear to ausculation bilaterally, normal work of breathing Heart- Regular rate and rhythm, no murmurs, rubs or gallops, PMI not laterally displaced GI- soft, NT, ND, + BS Extremities- no clubbing, cyanosis, or edema  Wt Readings from Last 3 Encounters:  10/06/18 260 lb 9.6 oz (118.2 kg)  09/10/18 263 lb (119.3 kg)  09/08/18 263 lb 6.4 oz (119.5 kg)    EKG tracing ordered today is personally reviewed and shows sinus rhythm 62 bpm, PR 172 msec, QRS 104 msec, QTc 472 msec  Assessment and Plan:  1. Persistent  afib Maintaining sinus with tikosyn Continue anticoagulation chads2vasc score is 3.  Continue on eliquis Bmet, mg today  2. 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 Preop Ok to proceed with parotid surgery at this time Ok to hold eliquis 48 hours prior to surgery and resume post operatively  4. HTN Stable No change required today Recently started on spironolactone bmet today  Return in 3 months with repeat echo at that time.  Thompson Grayer MD, [facility] 10/06/2018 11:03 AM

## 2018-10-11 LAB — MAGNESIUM: MAGNESIUM: 2 mg/dL (ref 1.6–2.3)

## 2018-10-11 LAB — SPECIMEN STATUS REPORT

## 2018-10-24 ENCOUNTER — Encounter (HOSPITAL_BASED_OUTPATIENT_CLINIC_OR_DEPARTMENT_OTHER): Payer: PRIVATE HEALTH INSURANCE

## 2018-10-27 ENCOUNTER — Other Ambulatory Visit: Payer: Self-pay | Admitting: Otolaryngology

## 2018-10-29 ENCOUNTER — Ambulatory Visit: Payer: PRIVATE HEALTH INSURANCE | Attending: Nurse Practitioner | Admitting: Cardiovascular Disease

## 2018-10-29 DIAGNOSIS — G4733 Obstructive sleep apnea (adult) (pediatric): Secondary | ICD-10-CM | POA: Diagnosis not present

## 2018-10-29 DIAGNOSIS — I4819 Other persistent atrial fibrillation: Secondary | ICD-10-CM | POA: Diagnosis present

## 2018-11-01 ENCOUNTER — Other Ambulatory Visit (HOSPITAL_COMMUNITY): Payer: PRIVATE HEALTH INSURANCE

## 2018-11-05 ENCOUNTER — Encounter (HOSPITAL_BASED_OUTPATIENT_CLINIC_OR_DEPARTMENT_OTHER): Payer: PRIVATE HEALTH INSURANCE

## 2018-11-12 NOTE — Pre-Procedure Instructions (Signed)
Casey [name].  11/12/2018      [facility] [place]  [address] Phone: [phone] Fax: [phone]  [id number] - [place], [place] [address] [place] [address] Phone: [phone] Fax: [phone]  [facility] [place] [place] - [address] [place] [address] Phone: [phone] Fax: [phone]    Your procedure is scheduled on Fri., Dec. 20, 2019 from 11:30AM-1:30PM  Report to [facility] "A" at 9:30AM  Call this number if you have problems the morning of surgery:  [phone]   Remember:  Do not eat or drink after midnight on Dec. 19th    Take these medicines the morning of surgery with A SIP OF WATER: Carvedilol (COREG) and Dofetilide (TIKOSYN)  If needed: HYDROcodone-acetaminophen [employer])   Stop taking Eliquis 2 days (11/17/18) prior to surgery, per Dr. Rayann Heman  As of today, stop taking all Other Aspirin Products, Vitamins, Fish oils, and Herbal medications. Also stop all NSAIDS i.e. Advil, Ibuprofen, Motrin, Aleve, Anaprox, Naproxen, BC, Goody Powders, and all Supplements.   WHAT DO I DO ABOUT MY DIABETES MEDICATION?  [name] Do not take Glimepiride (AMARYL) and MetFORMIN (GLUCOPHAGE-XR) the morning of surgery.  . The day of surgery, do not take other diabetes injectable Victoza (liraglutide)  How to Manage Your Diabetes Before and After Surgery  Why is it important to control my blood sugar before and after surgery? . Improving blood sugar levels before and after surgery helps healing and can limit problems. . A way of improving blood sugar control is eating a healthy diet by: o  Eating less sugar and carbohydrates o  Increasing activity/exercise o  Talking with your doctor about reaching your blood sugar goals . High blood sugars (greater than 180 mg/dL) can raise  your risk of infections and slow your recovery, so you will need to focus on controlling your diabetes during the weeks before surgery. . Make sure that the doctor who takes care of your diabetes knows about your planned surgery including the date and location.  How do I manage my blood sugar before surgery? . Check your blood sugar at least 4 times a day, starting 2 days before surgery, to make sure that the level is not too high or low. o Check your blood sugar the morning of your surgery when you wake up and every 2 hours until you get to the Short Stay unit. . If your blood sugar is less than 70 mg/dL, you will need to treat for low blood sugar: o Do not take insulin. o Treat a low blood sugar (less than 70 mg/dL) with  cup of clear juice (cranberry or apple), 4 glucose tablets, OR glucose gel. Recheck blood sugar in 15 minutes after treatment (to make sure it is greater than 70 mg/dL). If your blood sugar is not greater than 70 mg/dL on recheck, call [phone]   . If your CBG is greater than 220 mg/dL, call the number above for further instructions.  . If you are admitted to the hospital after surgery: o Your blood sugar will be checked by the staff and you will probably be given insulin after surgery (instead of oral diabetes medicines) to make sure you have good blood sugar levels. o The goal for blood sugar control after surgery is 80-180 mg/dL  Reviewed and  Endorsed by [facility] Patient Education Committee, August 2015  Remember:  Do not wear jewelry.  Do not wear lotions, powders, colognes, or deodorant.  Do not shave 48 hours prior to surgery.  Men may shave face.  Do not bring valuables to the hospital.  [facility] is not responsible for any belongings or valuables.  Contacts, dentures or bridgework may not be worn into surgery.  Leave your suitcase in the car.  After surgery it may be brought to your room.  For patients admitted to the hospital, discharge time will be  determined by your treatment team.  Patients discharged the day of surgery will not be allowed to drive home.   Special instructions:  [place]- Preparing For Surgery  Before surgery, you can play an important role. Because skin is not sterile, your skin needs to be as free of germs as possible. You can reduce the number of germs on your skin by washing with CHG (chlorahexidine gluconate) Soap before surgery.  CHG is an antiseptic cleaner which kills germs and bonds with the skin to continue killing germs even after washing.    Oral Hygiene is also important to reduce your risk of infection.  Remember - BRUSH YOUR TEETH THE MORNING OF SURGERY WITH YOUR REGULAR TOOTHPASTE  Please do not use if you have an allergy to CHG or antibacterial soaps. If your skin becomes reddened/irritated stop using the CHG.  Do not shave (including legs and underarms) for at least 48 hours prior to first CHG shower. It is OK to shave your face.  Please follow these instructions carefully.   1. Shower the NIGHT BEFORE SURGERY and the MORNING OF SURGERY with CHG.   2. If you chose to wash your hair, wash your hair first as usual with your normal shampoo.  3. After you shampoo, rinse your hair and body thoroughly to remove the shampoo.  4. Use CHG as you would any other liquid soap. You can apply CHG directly to the skin and wash gently with a scrungie or a clean washcloth.   5. Apply the CHG Soap to your body ONLY FROM THE NECK DOWN.  Do not use on open wounds or open sores. Avoid contact with your eyes, ears, mouth and genitals (private parts). Wash Face and genitals (private parts)  with your normal soap.  6. Wash thoroughly, paying special attention to the area where your surgery will be performed.  7. Thoroughly rinse your body with warm water from the neck down.  8. DO NOT shower/wash with your normal soap after using and rinsing off the CHG Soap.  9. Pat yourself dry with a CLEAN TOWEL.  10. Wear  CLEAN PAJAMAS to bed the night before surgery, wear comfortable clothes the morning of surgery  11. Place CLEAN SHEETS on your bed the night of your first shower and DO NOT SLEEP WITH PETS.  Day of Surgery:  Do not apply any deodorants/lotions.  Please wear clean clothes to the hospital/surgery center.   Remember to brush your teeth WITH YOUR REGULAR TOOTHPASTE.  Please read over the following fact sheets that you were given. Pain Booklet, Coughing and Deep Breathing and Surgical Site Infection Prevention

## 2018-11-14 ENCOUNTER — Encounter: Payer: Self-pay | Admitting: Cardiovascular Disease

## 2018-11-14 NOTE — Procedures (Signed)
[facility]    Patient Name: Casey [name], Kawahara Date: 10/29/2018 Gender: Male D.O.B: [birth year] Age (years): 62 Referring Provider: Sherran Needs Height (inches): 33 Interpreting Physician: Shelva Majestic MD, ABSM Weight (lbs): 263 RPSGT: Peak, Robert BMI: 38 MRN: [id number] Neck Size: 19.50  CLINICAL INFORMATION Sleep Study Type: Split Night CPAP  Indication for sleep study: Snoring, Atrial Fibrillation  Epworth Sleepiness Score: 4  SLEEP STUDY TECHNIQUE As per the AASM Manual for the Scoring of Sleep and Associated Events v2.3 (April 2016) with a hypopnea requiring 4% desaturations.  The channels recorded and monitored were frontal, central and occipital EEG, electrooculogram (EOG), submentalis EMG (chin), nasal and oral airflow, thoracic and abdominal wall motion, anterior tibialis EMG, snore microphone, electrocardiogram, and pulse oximetry. Continuous positive airway pressure (CPAP) was initiated when the patient met split night criteria and was titrated according to treat sleep-disordered breathing.  MEDICATIONS     apixaban (ELIQUIS) 5 MG TABS tablet         atorvastatin (LIPITOR) 10 MG tablet         carvedilol (COREG) 25 MG tablet         docusate sodium (COLACE) 100 MG capsule         dofetilide (TIKOSYN) 500 MCG capsule         glimepiride (AMARYL) 2 MG tablet         HYDROcodone-acetaminophen (NORCO) 10-325 MG tablet         liraglutide (VICTOZA) 18 MG/3ML SOPN         magnesium gluconate (MAGONATE) 500 MG tablet         metFORMIN (GLUCOPHAGE-XR) 500 MG 24 hr tablet         Multiple Vitamin (MULTIVITAMIN WITH MINERALS) TABS tablet         sacubitril-valsartan (ENTRESTO) 49-51 MG         spironolactone (ALDACTONE) 25 MG tablet      Medications self-administered by patient taken the night of the study : N/A  RESPIRATORY PARAMETERS Diagnostic Total AHI (/hr): 17.0 RDI (/hr): 25.3 OA Index (/hr): - CA Index (/hr): 1.7 REM AHI (/hr): 22.5 NREM  AHI (/hr): 15.5 Supine AHI (/hr): N/A Non-supine AHI (/hr): 17 Min O2 Sat (%): 85.0 Mean O2 (%): 91.9 Time below 88% (min): 3.2   Titration Optimal Pressure (cm):  AHI at Optimal Pressure (/hr): N/A Min O2 at Optimal Pressure (%): 88.0 Supine % at Optimal (%): N/A Sleep % at Optimal (%): N/A   SLEEP ARCHITECTURE The recording time for the entire night was 380.9 minutes.  During a baseline period of 161.9 minutes, the patient slept for 144.5 minutes in REM and nonREM, yielding a sleep efficiency of 89.2%%. Sleep onset after lights out was 3.1 minutes with a REM latency of 82.0 minutes. The patient spent 15.2%% of the night in stage N1 sleep, 62.6%% in stage N2 sleep, 0.0%% in stage N3 and 22.1% in REM.  During the titration period of 217.8 minutes, the patient slept for 181.0 minutes in REM and nonREM, yielding a sleep efficiency of 83.1%%. Sleep onset after CPAP initiation was 4.5 minutes with a REM latency of 59.0 minutes. The patient spent 10.8%% of the night in stage N1 sleep, 69.9%% in stage N2 sleep, 0.0%% in stage N3 and 19.3% in REM.  CARDIAC DATA The 2 lead EKG demonstrated sinus rhythm. The mean heart rate was 100.0 beats per minute. Other EKG findings include: None.  LEG MOVEMENT DATA The total Periodic Limb Movements of  Sleep (PLMS) were 0. The PLMS index was 0.0 .  IMPRESSIONS - Moderate obstructive sleep apnea occurred during the diagnostic portion of the study(AHI 17.0/h; RDI 25.3/h). An optimal PAP pressure could not be selected for this patient based on the available study data. - No significant central sleep apnea occurred during the diagnostic portion of the study (CAI 1.7/h). - Mild oxygen desaturation during the diagnostic portion of the study to a nadir of 85%. - The patient snored with loud snoring volume during the diagnostic portion of the study. - No cardiac abnormalities were noted during this study. - Clinically significant periodic limb movements did not occur  during sleep.  DIAGNOSIS - Obstructive Sleep Apnea (327.23 [G47.33 ICD-10])  RECOMMENDATIONS - Recommend BiPAP titration given sub-optimal CPAP titration. - Efforts should be made to optimize nasal and oropharyngeal patency. - Avoid alcohol, sedatives and other CNS depressants that may worsen sleep apnea and disrupt normal sleep architecture. - Sleep hygiene should be reviewed to assess factors that may improve sleep quality. - Weight management (BMI 38) and regular exercise should be initiated or continued. - Recommend a sleep clinic evaluaion after BiPAP titration and after 4 weeks of therapy  [Electronically signed] 11/14/2018 10:31 AM  Shelva Majestic MD, [facility], ABSM Diplomate, American Board of Sleep Medicine   NPI: [id number] [place] PH: [phone]   FX: [phone] [place]

## 2018-11-15 ENCOUNTER — Encounter (HOSPITAL_COMMUNITY)
Admission: RE | Admit: 2018-11-15 | Discharge: 2018-11-15 | Disposition: A | Discharge status: Home / Self Care | Payer: PRIVATE HEALTH INSURANCE | Source: Ambulatory Visit | Attending: Otolaryngology | Admitting: Otolaryngology

## 2018-11-15 ENCOUNTER — Telehealth: Payer: Self-pay | Admitting: *Deleted

## 2018-11-15 ENCOUNTER — Other Ambulatory Visit: Payer: Self-pay

## 2018-11-15 ENCOUNTER — Other Ambulatory Visit: Payer: Self-pay | Admitting: Cardiovascular Disease

## 2018-11-15 ENCOUNTER — Encounter (HOSPITAL_COMMUNITY): Payer: Self-pay

## 2018-11-15 DIAGNOSIS — Z01812 Encounter for preprocedural laboratory examination: Secondary | ICD-10-CM | POA: Diagnosis present

## 2018-11-15 DIAGNOSIS — I5022 Chronic systolic (congestive) heart failure: Secondary | ICD-10-CM

## 2018-11-15 DIAGNOSIS — I4819 Other persistent atrial fibrillation: Secondary | ICD-10-CM

## 2018-11-15 DIAGNOSIS — G4733 Obstructive sleep apnea (adult) (pediatric): Secondary | ICD-10-CM

## 2018-11-15 HISTORY — DX: Cardiac arrhythmia, unspecified: I49.9

## 2018-11-15 HISTORY — DX: Unspecified atrial fibrillation: I48.91

## 2018-11-15 LAB — BASIC METABOLIC PANEL
Anion gap: 11 (ref 5–15)
BUN: 14 mg/dL (ref 8–23)
CHLORIDE: 103 mmol/L (ref 98–111)
CO2: 22 mmol/L (ref 22–32)
Calcium: 9.2 mg/dL (ref 8.9–10.3)
Creatinine, Ser: 0.85 mg/dL (ref 0.61–1.24)
GFR calc Af Amer: >60 mL/min (ref >60)
GFR calc non Af Amer: >60 mL/min (ref >60)
Glucose, Bld: 138 mg/dL — ABNORMAL HIGH (ref 70–99)
Potassium: 3.9 mmol/L (ref 3.5–5.1)
Sodium: 136 mmol/L (ref 135–145)

## 2018-11-15 LAB — CBC
HCT: 45.6 % (ref 39.0–52.0)
HEMOGLOBIN: 14.7 g/dL (ref 13.0–17.0)
MCH: 29.7 pg (ref 26.0–34.0)
MCHC: 32.2 g/dL (ref 30.0–36.0)
MCV: 92.1 fL (ref 80.0–100.0)
Platelets: 189 10*3/uL (ref 150–400)
RBC: 4.95 MIL/uL (ref 4.22–5.81)
RDW: 13.2 % (ref 11.5–15.5)
WBC: 9.2 10*3/uL (ref 4.0–10.5)
nRBC: 0 % (ref 0.0–0.2)

## 2018-11-15 LAB — HEMOGLOBIN A1C
HEMOGLOBIN A1C: 7 % — AB (ref 4.8–5.6)
Mean Plasma Glucose: 154.2 mg/dL

## 2018-11-15 LAB — GLUCOSE, CAPILLARY: Glucose-Capillary: 127 mg/dL — ABNORMAL HIGH (ref 70–99)

## 2018-11-15 NOTE — Telephone Encounter (Signed)
-----   Message from Troy Sine, MD sent at 11/14/2018 10:39 AM EST ----- Mariann Laster please notify pt and et up for BiPAP titration

## 2018-11-15 NOTE — Telephone Encounter (Signed)
Patient returned a call to me and was given his sleep study results and recommendations. He agrees to having a BIPAP titration.

## 2018-11-15 NOTE — Progress Notes (Signed)
PCP - Dr. Orpah Melter Cardiologist - Dr. Mallie Mussel Smith/ Dr. Thompson Grayer  Chest x-ray - N/A EKG - 10/06/18 Stress Test - 10+ years ago ECHO - 07/13/18 Cardiac Cath - 07/21/18   Sleep Study - 10/29/18 CPAP - waiting for CPAP to be ordered.   Fasting Blood Sugar - 95-120 Checks Blood Sugar 1 time a day.  CBG at PAT appointment 127  Blood Thinner Instructions: hold eliquis 48 hours prior to surgery per Dr. Rayann Heman. LD 11/17/18.  Anesthesia review: Yes, heart hx.   Patient denies shortness of breath, fever, cough and chest pain at PAT appointment   Patient verbalized understanding of instructions that were given to them at the PAT appointment. Patient was also instructed that they will need to review over the PAT instructions again at home before surgery.

## 2018-11-15 NOTE — Telephone Encounter (Signed)
Left message to return a call to discuss sleep study results and recommendations. 

## 2018-11-16 NOTE — Progress Notes (Signed)
Anesthesia Chart Review   Case:  [id number] Date/Time:  11/19/18 1115   Procedure:  PAROTIDECTOMY (Right )   Anesthesia type:  General   Pre-op diagnosis:  Right parotid neoplasm   Location:  [facility] OR ROOM 12 / [place] [facility]   Surgeon:  Melida Quitter, MD      DISCUSSION: 62 yo male former smoker 13.5 pack years (quit smoking 01/19/06) with h/o DM II, HTN, OSA, A-fib, laparoscopic banding 2012.  He reports difficulty waking up after anesthesia.  He was previously scheduled for parotidectomy 07/16/18, delayed due to new A-fib on EKG in PAT appointment, see PAT note by Myra Gianotti on 07/08/18 for more detail.    Seen in A-fib clinic following PAT visit due to new onset A-fib, Echo ordered with reduced EF at 30-35%, surgery delayed.  LHC did show 3 vessel  moderate CAD, pt asymptomatic therefore medically managed.  CHA2DS2Vasc is 3, pt started on Eliquis. Cardioversion unsuccessful, subsequently started on Tykosin 08/31/18 with good rate control.  Sleep study performed on 10/29/18 with moderate OSA, BiPAP recommended.  Per Dr. Tamala Julian overall pt is doing well, asymptomatic, 4 month follow-up for repeat ECHO recommended.   Pt seen by Dr. Thompson Grayer 10/06/18, pt cleared for upcoming parotid surgery and advised pt to hold Eliquis 48 hrs prior to surgery.    Anticipate pt can proceed with as planned barring acute status change.   VS: BP (!) 145/85   Pulse 64   Temp 36.7 C   Resp 20   Ht 5\' 10"  (1.778 m)   Wt 117.6 kg   SpO2 97%   BMI 37.19 kg/m   PROVIDERS: Orpah Melter, MD is PCP  Thompson Grayer, MD is Primary EP  Daneen Schick, MD is Primary Cardiologist   LABS: Labs reviewed: Acceptable for surgery. (all labs ordered are listed, but only abnormal results are displayed)  Labs Reviewed  GLUCOSE, CAPILLARY - Abnormal; Notable for the following components:      Result Value   Glucose-Capillary 127 (*)    All other components within normal limits  BASIC METABOLIC PANEL - Abnormal; Notable for the  following components:   Glucose, Bld 138 (*)    All other components within normal limits  HEMOGLOBIN A1C - Abnormal; Notable for the following components:   Hgb A1c MFr Bld 7.0 (*)    All other components within normal limits  CBC     IMAGES: CT paranasal sinuses 06/01/18 ([facility]): IMPRESSION: 1. Previous bilateral maxillary ORIF with mild mucoperiosteal thickening. Preserved 0MC patency, and patent bilateral accessory maxillary sinus drainage. 2. Mild right frontoethmoidal recess mucosal thickening, otherwise normally aerated paranasal sinuses and patent sinus drainage pathways. 3. Note hyperplastic sphenoid sinus including uncovering of the right carotid canal. Hyperplastic frontal sinuses. 4. Nasal septal deviation and spurring.  EKG: 10/06/2018 Normal Sinus rhythm  CV: Echocardiogram 07/13/18:  Study Conclusions  - Left ventricle: The cavity size was mildly dilated. Wall   thickness was increased in a pattern of mild LVH. Systolic   function was moderately to severely reduced. The estimated   ejection fraction was in the range of 30% to 35%. Diffuse   hypokinesis. - Aortic valve: There was trivial regurgitation. - Mitral valve: There was mild regurgitation.  Impressions:  - Moderate to severe global reduction in LV systolic function; mild   LVE; mild LVH; trace AI; mild MR.  Cardiac Catheterization 07/21/18:   Moderate three-vessel coronary artery disease.  60 to 70% mid RCA and 95% stenosis  of the distal right coronary beyond the origin of the PDA.  Normal left main  Mid LAD 60 to 70% stenosis beyond the origin of the dominant first diagonal.  Large ramus intermedius with luminal irregularity.  Relatively small distribution circumflex with 70% obstruction in the first marginal and total occlusion of the distal circumflex before the origin of the small second obtuse marginal.  There are faint left to left collaterals to the distal  circumflex. Moderately severe left ventricular dysfunction with global hypokinesis, EF 35%.  Normal LVEDP.     Past Medical History:  Diagnosis Date  . Arthritis   . Atrial fibrillation ([place]) 07/2018  . Complication of anesthesia    diff. waking up  . Diabetes mellitus   . Dysrhythmia 07/2018   A-Fib  . Headache(784.0)   . Hypertension    dr Marisue Humble   pcp  . Sleep apnea    prior to weight lose surgery-Dr. Don Perking not use CPAP    Past Surgical History:  Procedure Laterality Date  . APPENDECTOMY    . BACK SURGERY     5  back surgeries  . CARDIAC CATHETERIZATION    . CARDIOVERSION N/A 08/13/2018   Procedure: CARDIOVERSION;  Surgeon: Larey Dresser, MD;  [facility]: [facility] ENDOSCOPY;  Service: Cardiovascular;  Laterality: N/A;  . CARDIOVERSION N/A 09/02/2018   Procedure: CARDIOVERSION;  Surgeon: Thayer Headings, MD;  Location: [place];  Service: Cardiovascular;  Laterality: N/A;  . CARPAL TUNNEL RELEASE     bil  . COLON SURGERY     2004 for colon rupture  . HERNIA REPAIR    . LAPAROSCOPIC GASTRIC BANDING    . LEFT HEART CATH AND CORONARY ANGIOGRAPHY N/A 07/21/2018   Procedure: LEFT HEART CATH AND CORONARY ANGIOGRAPHY;  Surgeon: Belva Crome, MD;  Location: [place] CV LAB;  Service: Cardiovascular;  Laterality: N/A;  . MANDIBLE FRACTURE SURGERY     x 2  . TONSILLECTOMY      MEDICATIONS: . apixaban (ELIQUIS) 5 MG TABS tablet  . atorvastatin (LIPITOR) 10 MG tablet  . carvedilol (COREG) 25 MG tablet  . docusate sodium (COLACE) 100 MG capsule  . dofetilide (TIKOSYN) 500 MCG capsule  . glimepiride (AMARYL) 2 MG tablet  . HYDROcodone-acetaminophen (NORCO) 10-325 MG tablet  . liraglutide (VICTOZA) 18 MG/3ML SOPN  . magnesium gluconate (MAGONATE) 500 MG tablet  . metFORMIN (GLUCOPHAGE-XR) 500 MG 24 hr tablet  . Multiple Vitamin (MULTIVITAMIN WITH MINERALS) TABS tablet  . sacubitril-valsartan (ENTRESTO) 49-51 MG  . spironolactone (ALDACTONE) 25 MG tablet   No  current facility-administered medications for this encounter.     Maia Plan [facility]/Anesthesiology Phone [phone] 11/16/18 3:58 PM

## 2018-11-18 ENCOUNTER — Telehealth: Payer: Self-pay | Admitting: *Deleted

## 2018-11-18 ENCOUNTER — Other Ambulatory Visit: Payer: Self-pay | Admitting: Cardiovascular Disease

## 2018-11-18 DIAGNOSIS — G4733 Obstructive sleep apnea (adult) (pediatric): Secondary | ICD-10-CM

## 2018-11-18 NOTE — Telephone Encounter (Signed)
-----   Message from Lauralee Evener, CMA sent at 11/15/2018 10:30 AM EST ----- BIPAP titration

## 2018-11-18 NOTE — Telephone Encounter (Signed)
Left message to return a call to inform of BIPAP titration appointment details.

## 2018-11-19 ENCOUNTER — Encounter (HOSPITAL_COMMUNITY): Payer: Self-pay | Admitting: Certified Registered Nurse Anesthetist

## 2018-11-19 ENCOUNTER — Ambulatory Visit (HOSPITAL_COMMUNITY): Payer: PRIVATE HEALTH INSURANCE | Admitting: Physician Assistant

## 2018-11-19 ENCOUNTER — Other Ambulatory Visit: Payer: Self-pay

## 2018-11-19 ENCOUNTER — Encounter (HOSPITAL_COMMUNITY)
Admission: RE | Disposition: A | Discharge status: Home / Self Care | Payer: Self-pay | Source: Home / Self Care | Attending: Otolaryngology

## 2018-11-19 ENCOUNTER — Observation Stay (HOSPITAL_COMMUNITY)
Admission: RE | Admit: 2018-11-19 | Discharge: 2018-11-20 | Disposition: A | Discharge status: Home / Self Care | Payer: PRIVATE HEALTH INSURANCE | Attending: Otolaryngology | Admitting: Otolaryngology

## 2018-11-19 ENCOUNTER — Ambulatory Visit (HOSPITAL_COMMUNITY): Payer: PRIVATE HEALTH INSURANCE | Admitting: Certified Registered Nurse Anesthetist

## 2018-11-19 DIAGNOSIS — Z7984 Long term (current) use of oral hypoglycemic drugs: Secondary | ICD-10-CM | POA: Diagnosis not present

## 2018-11-19 DIAGNOSIS — Z9884 Bariatric surgery status: Secondary | ICD-10-CM | POA: Insufficient documentation

## 2018-11-19 DIAGNOSIS — I2510 Atherosclerotic heart disease of native coronary artery without angina pectoris: Secondary | ICD-10-CM | POA: Insufficient documentation

## 2018-11-19 DIAGNOSIS — Z87891 Personal history of nicotine dependence: Secondary | ICD-10-CM | POA: Diagnosis not present

## 2018-11-19 DIAGNOSIS — Z7901 Long term (current) use of anticoagulants: Secondary | ICD-10-CM | POA: Diagnosis not present

## 2018-11-19 DIAGNOSIS — E669 Obesity, unspecified: Secondary | ICD-10-CM | POA: Insufficient documentation

## 2018-11-19 DIAGNOSIS — Z885 Allergy status to narcotic agent status: Secondary | ICD-10-CM | POA: Insufficient documentation

## 2018-11-19 DIAGNOSIS — Z833 Family history of diabetes mellitus: Secondary | ICD-10-CM | POA: Diagnosis not present

## 2018-11-19 DIAGNOSIS — E785 Hyperlipidemia, unspecified: Secondary | ICD-10-CM | POA: Insufficient documentation

## 2018-11-19 DIAGNOSIS — I4891 Unspecified atrial fibrillation: Secondary | ICD-10-CM | POA: Insufficient documentation

## 2018-11-19 DIAGNOSIS — G4730 Sleep apnea, unspecified: Secondary | ICD-10-CM | POA: Diagnosis not present

## 2018-11-19 DIAGNOSIS — Z79899 Other long term (current) drug therapy: Secondary | ICD-10-CM | POA: Insufficient documentation

## 2018-11-19 DIAGNOSIS — M1990 Unspecified osteoarthritis, unspecified site: Secondary | ICD-10-CM | POA: Insufficient documentation

## 2018-11-19 DIAGNOSIS — I10 Essential (primary) hypertension: Secondary | ICD-10-CM | POA: Insufficient documentation

## 2018-11-19 DIAGNOSIS — D490 Neoplasm of unspecified behavior of digestive system: Secondary | ICD-10-CM | POA: Diagnosis present

## 2018-11-19 DIAGNOSIS — Z6835 Body mass index (BMI) 35.0-35.9, adult: Secondary | ICD-10-CM | POA: Insufficient documentation

## 2018-11-19 DIAGNOSIS — E119 Type 2 diabetes mellitus without complications: Secondary | ICD-10-CM | POA: Diagnosis not present

## 2018-11-19 HISTORY — PX: PAROTIDECTOMY: SHX2163

## 2018-11-19 LAB — GLUCOSE, CAPILLARY
GLUCOSE-CAPILLARY: 139 mg/dL — AB (ref 70–99)
Glucose-Capillary: 113 mg/dL — ABNORMAL HIGH (ref 70–99)
Glucose-Capillary: 124 mg/dL — ABNORMAL HIGH (ref 70–99)
Glucose-Capillary: 153 mg/dL — ABNORMAL HIGH (ref 70–99)
Glucose-Capillary: 259 mg/dL — ABNORMAL HIGH (ref 70–99)

## 2018-11-19 LAB — PROTIME-INR
INR: 1.11
Prothrombin Time: 14.3 seconds (ref 11.4–15.2)

## 2018-11-19 SURGERY — EXCISION, PAROTID GLAND
Anesthesia: General | Site: Neck | Laterality: Right

## 2018-11-19 MED ORDER — DOCUSATE SODIUM 100 MG PO CAPS
100.0000 mg | ORAL_CAPSULE | Freq: Two times a day (BID) | ORAL | Status: DC
  Administered 2018-11-19 – 2018-11-20 (×2): 100 mg via ORAL
  Filled 2018-11-19 (×2): qty 1

## 2018-11-19 MED ORDER — FENTANYL CITRATE (PF) 100 MCG/2ML IJ SOLN
25.0000 ug | INTRAMUSCULAR | Status: DC | PRN
  Administered 2018-11-19: 25 ug via INTRAVENOUS

## 2018-11-19 MED ORDER — ONDANSETRON HCL 4 MG/2ML IJ SOLN
INTRAMUSCULAR | Status: AC
  Filled 2018-11-19: qty 6

## 2018-11-19 MED ORDER — ONDANSETRON HCL 4 MG/2ML IJ SOLN
INTRAMUSCULAR | Status: DC | PRN
  Administered 2018-11-19: 4 mg via INTRAVENOUS

## 2018-11-19 MED ORDER — SPIRONOLACTONE 12.5 MG HALF TABLET
12.5000 mg | ORAL_TABLET | Freq: Every day | ORAL | Status: DC
  Administered 2018-11-19 – 2018-11-20 (×2): 12.5 mg via ORAL
  Filled 2018-11-19 (×2): qty 1

## 2018-11-19 MED ORDER — ROCURONIUM BROMIDE 50 MG/5ML IV SOSY
PREFILLED_SYRINGE | INTRAVENOUS | Status: AC
  Filled 2018-11-19: qty 20

## 2018-11-19 MED ORDER — SUCCINYLCHOLINE CHLORIDE 200 MG/10ML IV SOSY
PREFILLED_SYRINGE | INTRAVENOUS | Status: AC
  Filled 2018-11-19: qty 10

## 2018-11-19 MED ORDER — GLIMEPIRIDE 2 MG PO TABS
2.0000 mg | ORAL_TABLET | Freq: Every day | ORAL | Status: DC
  Administered 2018-11-20: 2 mg via ORAL
  Filled 2018-11-19: qty 1

## 2018-11-19 MED ORDER — INSULIN ASPART 100 UNIT/ML ~~LOC~~ SOLN
0.0000 [IU] | Freq: Three times a day (TID) | SUBCUTANEOUS | Status: DC
  Administered 2018-11-19: 3 [IU] via SUBCUTANEOUS
  Administered 2018-11-20: 11 [IU] via SUBCUTANEOUS

## 2018-11-19 MED ORDER — CLINDAMYCIN PHOSPHATE 900 MG/50ML IV SOLN
900.0000 mg | INTRAVENOUS | Status: AC
  Administered 2018-11-19: 900 mg via INTRAVENOUS
  Filled 2018-11-19: qty 50

## 2018-11-19 MED ORDER — MAGNESIUM GLUCONATE 500 MG PO TABS
500.0000 mg | ORAL_TABLET | Freq: Two times a day (BID) | ORAL | Status: DC
  Administered 2018-11-19 – 2018-11-20 (×2): 500 mg via ORAL
  Filled 2018-11-19 (×2): qty 1

## 2018-11-19 MED ORDER — LIDOCAINE-EPINEPHRINE 1 %-1:100000 IJ SOLN
INTRAMUSCULAR | Status: AC
  Filled 2018-11-19: qty 1

## 2018-11-19 MED ORDER — LACTATED RINGERS IV SOLN
INTRAVENOUS | Status: DC
  Administered 2018-11-19: 10:00:00 via INTRAVENOUS

## 2018-11-19 MED ORDER — LIRAGLUTIDE 18 MG/3ML ~~LOC~~ SOPN: 1.8000 mg | PEN_INJECTOR | Freq: Every evening | SUBCUTANEOUS | Status: DC

## 2018-11-19 MED ORDER — POTASSIUM CHLORIDE IN NACL 20-0.45 MEQ/L-% IV SOLN
INTRAVENOUS | Status: DC
  Administered 2018-11-19: 18:00:00 via INTRAVENOUS
  Filled 2018-11-19 (×2): qty 1000

## 2018-11-19 MED ORDER — DEXAMETHASONE SODIUM PHOSPHATE 10 MG/ML IJ SOLN
INTRAMUSCULAR | Status: AC
  Filled 2018-11-19: qty 3

## 2018-11-19 MED ORDER — FENTANYL CITRATE (PF) 250 MCG/5ML IJ SOLN
INTRAMUSCULAR | Status: DC | PRN
  Administered 2018-11-19 (×4): 50 ug via INTRAVENOUS

## 2018-11-19 MED ORDER — PROMETHAZINE HCL 25 MG/ML IJ SOLN: 6.2500 mg | INTRAMUSCULAR | Status: DC | PRN

## 2018-11-19 MED ORDER — ATORVASTATIN CALCIUM 10 MG PO TABS
10.0000 mg | ORAL_TABLET | Freq: Every day | ORAL | Status: DC
  Administered 2018-11-19: 10 mg via ORAL
  Filled 2018-11-19: qty 1

## 2018-11-19 MED ORDER — LIDOCAINE 2% (20 MG/ML) 5 ML SYRINGE
INTRAMUSCULAR | Status: AC
  Filled 2018-11-19: qty 15

## 2018-11-19 MED ORDER — FENTANYL CITRATE (PF) 250 MCG/5ML IJ SOLN
INTRAMUSCULAR | Status: AC
  Filled 2018-11-19: qty 5

## 2018-11-19 MED ORDER — DEXAMETHASONE SODIUM PHOSPHATE 10 MG/ML IJ SOLN
INTRAMUSCULAR | Status: DC | PRN
  Administered 2018-11-19: 10 mg via INTRAVENOUS

## 2018-11-19 MED ORDER — INSULIN ASPART 100 UNIT/ML ~~LOC~~ SOLN
0.0000 [IU] | Freq: Every day | SUBCUTANEOUS | Status: DC
  Administered 2018-11-19: 3 [IU] via SUBCUTANEOUS

## 2018-11-19 MED ORDER — HYDROCODONE-ACETAMINOPHEN 5-325 MG PO TABS
1.0000 | ORAL_TABLET | ORAL | Status: DC | PRN
  Administered 2018-11-19 – 2018-11-20 (×2): 1 via ORAL
  Filled 2018-11-19 (×2): qty 1

## 2018-11-19 MED ORDER — 0.9 % SODIUM CHLORIDE (POUR BTL) OPTIME
TOPICAL | Status: DC | PRN
  Administered 2018-11-19: 1000 mL

## 2018-11-19 MED ORDER — METFORMIN HCL ER 500 MG PO TB24
1000.0000 mg | ORAL_TABLET | Freq: Two times a day (BID) | ORAL | Status: DC
  Administered 2018-11-20: 1000 mg via ORAL
  Filled 2018-11-19: qty 2

## 2018-11-19 MED ORDER — BACITRACIN ZINC 500 UNIT/GM EX OINT
1.0000 "application " | TOPICAL_OINTMENT | Freq: Three times a day (TID) | CUTANEOUS | Status: DC
  Administered 2018-11-19 (×2): 1 via TOPICAL
  Filled 2018-11-19: qty 28.35

## 2018-11-19 MED ORDER — PROPOFOL 10 MG/ML IV BOLUS
INTRAVENOUS | Status: AC
  Filled 2018-11-19: qty 20

## 2018-11-19 MED ORDER — DOFETILIDE 500 MCG PO CAPS
500.0000 ug | ORAL_CAPSULE | Freq: Two times a day (BID) | ORAL | Status: DC
  Administered 2018-11-19 – 2018-11-20 (×2): 500 ug via ORAL
  Filled 2018-11-19 (×2): qty 1

## 2018-11-19 MED ORDER — MORPHINE SULFATE (PF) 2 MG/ML IV SOLN: 2.0000 mg | INTRAVENOUS | Status: DC | PRN

## 2018-11-19 MED ORDER — SUCCINYLCHOLINE CHLORIDE 20 MG/ML IJ SOLN
INTRAMUSCULAR | Status: DC | PRN
  Administered 2018-11-19: 120 mg via INTRAVENOUS

## 2018-11-19 MED ORDER — MIDAZOLAM HCL 2 MG/2ML IJ SOLN
INTRAMUSCULAR | Status: DC | PRN
  Administered 2018-11-19 (×2): 1 mg via INTRAVENOUS

## 2018-11-19 MED ORDER — LIDOCAINE 2% (20 MG/ML) 5 ML SYRINGE
INTRAMUSCULAR | Status: DC | PRN
  Administered 2018-11-19: 100 mg via INTRAVENOUS

## 2018-11-19 MED ORDER — SACUBITRIL-VALSARTAN 49-51 MG PO TABS
1.0000 | ORAL_TABLET | Freq: Two times a day (BID) | ORAL | Status: DC
  Administered 2018-11-19 – 2018-11-20 (×2): 1 via ORAL
  Filled 2018-11-19 (×2): qty 1

## 2018-11-19 MED ORDER — HYDROMORPHONE HCL 1 MG/ML IJ SOLN: 0.2500 mg | INTRAMUSCULAR | Status: DC | PRN

## 2018-11-19 MED ORDER — ADULT MULTIVITAMIN W/MINERALS CH
1.0000 | ORAL_TABLET | Freq: Every day | ORAL | Status: DC
  Administered 2018-11-19 – 2018-11-20 (×2): 1 via ORAL
  Filled 2018-11-19 (×2): qty 1

## 2018-11-19 MED ORDER — BACITRACIN ZINC 500 UNIT/GM EX OINT
TOPICAL_OINTMENT | CUTANEOUS | Status: AC
  Filled 2018-11-19: qty 28.35

## 2018-11-19 MED ORDER — BACITRACIN ZINC 500 UNIT/GM EX OINT
TOPICAL_OINTMENT | CUTANEOUS | Status: DC | PRN
  Administered 2018-11-19: 1 via TOPICAL

## 2018-11-19 MED ORDER — STERILE WATER FOR IRRIGATION IR SOLN
Status: DC | PRN
  Administered 2018-11-19: 1000 mL

## 2018-11-19 MED ORDER — MIDAZOLAM HCL 2 MG/2ML IJ SOLN
INTRAMUSCULAR | Status: AC
  Filled 2018-11-19: qty 2

## 2018-11-19 MED ORDER — FENTANYL CITRATE (PF) 100 MCG/2ML IJ SOLN
INTRAMUSCULAR | Status: AC
  Administered 2018-11-19: 25 ug via INTRAVENOUS
  Filled 2018-11-19: qty 2

## 2018-11-19 MED ORDER — SODIUM CHLORIDE 0.9 % IV SOLN
INTRAVENOUS | Status: DC | PRN
  Administered 2018-11-19: 30 ug/min via INTRAVENOUS

## 2018-11-19 MED ORDER — PROMETHAZINE HCL 25 MG RE SUPP
25.0000 mg | Freq: Four times a day (QID) | RECTAL | Status: DC | PRN
  Filled 2018-11-19: qty 1

## 2018-11-19 MED ORDER — CARVEDILOL 12.5 MG PO TABS
12.5000 mg | ORAL_TABLET | Freq: Two times a day (BID) | ORAL | Status: DC
  Administered 2018-11-19 – 2018-11-20 (×2): 12.5 mg via ORAL
  Filled 2018-11-19 (×2): qty 1

## 2018-11-19 MED ORDER — EPHEDRINE SULFATE-NACL 50-0.9 MG/10ML-% IV SOSY
PREFILLED_SYRINGE | INTRAVENOUS | Status: DC | PRN
  Administered 2018-11-19 (×2): 10 mg via INTRAVENOUS

## 2018-11-19 MED ORDER — PROMETHAZINE HCL 25 MG PO TABS: 25.0000 mg | ORAL_TABLET | Freq: Four times a day (QID) | ORAL | Status: DC | PRN

## 2018-11-19 MED ORDER — GLYCOPYRROLATE 0.2 MG/ML IJ SOLN
INTRAMUSCULAR | Status: DC | PRN
  Administered 2018-11-19: .1 mg via INTRAVENOUS
  Administered 2018-11-19: 0.1 mg via INTRAVENOUS

## 2018-11-19 MED ORDER — LIDOCAINE-EPINEPHRINE 1 %-1:100000 IJ SOLN
INTRAMUSCULAR | Status: DC | PRN
  Administered 2018-11-19: 3.5 mL

## 2018-11-19 MED ORDER — PROPOFOL 10 MG/ML IV BOLUS
INTRAVENOUS | Status: DC | PRN
  Administered 2018-11-19: 160 mg via INTRAVENOUS

## 2018-11-19 SURGICAL SUPPLY — 47 items
ATTRACTOMAT 16X20 MAGNETIC DRP (DRAPES) ×2 IMPLANT
BLADE SURG 15 STRL LF DISP TIS (BLADE) IMPLANT
BLADE SURG 15 STRL SS (BLADE) ×2
CANISTER SUCT 3000ML PPV (MISCELLANEOUS) ×2 IMPLANT
CLEANER TIP ELECTROSURG 2X2 (MISCELLANEOUS) ×2 IMPLANT
CONT SPEC 4OZ CLIKSEAL STRL BL (MISCELLANEOUS) ×4 IMPLANT
CORD BIPOLAR FORCEPS 12FT (ELECTRODE) ×2 IMPLANT
COVER SURGICAL LIGHT HANDLE (MISCELLANEOUS) ×2 IMPLANT
COVER WAND RF STERILE (DRAPES) ×2 IMPLANT
CRADLE DONUT ADULT HEAD (MISCELLANEOUS) ×1 IMPLANT
DECANTER SPIKE VIAL GLASS SM (MISCELLANEOUS) ×1 IMPLANT
DRAIN JACKSON RD 7FR 3/32 (WOUND CARE) ×1 IMPLANT
DRAPE HALF SHEET 40X57 (DRAPES) IMPLANT
DRAPE INCISE 13X13 STRL (DRAPES) ×2 IMPLANT
ELECT COATED BLADE 2.86 ST (ELECTRODE) ×3 IMPLANT
ELECT PAIRED SUBDERMAL (MISCELLANEOUS) ×2
ELECT REM PT RETURN 9FT ADLT (ELECTROSURGICAL) ×2
ELECTRODE PAIRED SUBDERMAL (MISCELLANEOUS) ×1 IMPLANT
ELECTRODE REM PT RTRN 9FT ADLT (ELECTROSURGICAL) ×1 IMPLANT
EVACUATOR SILICONE 100CC (DRAIN) ×2 IMPLANT
FORCEPS BIPOLAR SPETZLER 8 1.0 (NEUROSURGERY SUPPLIES) ×2 IMPLANT
FORCEPS TISS BAYO ENTCEPS (INSTRUMENTS) IMPLANT
GAUZE 4X4 16PLY RFD (DISPOSABLE) ×3 IMPLANT
GLOVE BIO SURGEON STRL SZ7.5 (GLOVE) ×3 IMPLANT
GOWN STRL REUS W/ TWL LRG LVL3 (GOWN DISPOSABLE) ×2 IMPLANT
GOWN STRL REUS W/TWL LRG LVL3 (GOWN DISPOSABLE) ×4
KIT BASIN OR (CUSTOM PROCEDURE TRAY) ×2 IMPLANT
KIT TURNOVER KIT B (KITS) ×2 IMPLANT
NDL HYPO 25GX1X1/2 BEV (NEEDLE) IMPLANT
NEEDLE HYPO 25GX1X1/2 BEV (NEEDLE) IMPLANT
NS IRRIG 1000ML POUR BTL (IV SOLUTION) ×4 IMPLANT
PAD ARMBOARD 7.5X6 YLW CONV (MISCELLANEOUS) ×3 IMPLANT
PENCIL BUTTON HOLSTER BLD 10FT (ELECTRODE) ×2 IMPLANT
PROBE NERVBE PRASS .33 (MISCELLANEOUS) ×2 IMPLANT
SHEARS HARMONIC 9CM CVD (BLADE) ×1 IMPLANT
SPECIMEN JAR MEDIUM (MISCELLANEOUS) ×2 IMPLANT
STAPLER VISISTAT 35W (STAPLE) ×1 IMPLANT
SUT ETHILON 2 0 FS 18 (SUTURE) ×5 IMPLANT
SUT ETHILON 5 0 P 3 18 (SUTURE) ×1
SUT NYLON ETHILON 5-0 P-3 1X18 (SUTURE) ×1 IMPLANT
SUT SILK 2 0 PERMA HAND 18 BK (SUTURE) ×2 IMPLANT
SUT SILK 3 0 REEL (SUTURE) ×2 IMPLANT
SUT SILK 3 0 SH CR/8 (SUTURE) ×2 IMPLANT
SUT VIC AB 3-0 SH 27 (SUTURE) ×2
SUT VIC AB 3-0 SH 27X BRD (SUTURE) ×1 IMPLANT
SUT VIC AB 4-0 PS2 27 (SUTURE) IMPLANT
TRAY ENT MC OR (CUSTOM PROCEDURE TRAY) ×2 IMPLANT

## 2018-11-19 NOTE — Anesthesia Preprocedure Evaluation (Addendum)
Anesthesia Evaluation  Patient identified by MRN, date of birth, ID band Patient awake    Reviewed: Allergy & Precautions, NPO status , Patient's Chart, lab work & pertinent test results, reviewed documented beta blocker date and time   Airway Mallampati: III  TM Distance: >3 FB Neck ROM: Full    Dental no notable dental hx.    Pulmonary sleep apnea , former smoker,    Pulmonary exam normal breath sounds clear to auscultation       Cardiovascular hypertension, Pt. on home beta blockers and Pt. on medications + CAD  Normal cardiovascular exam+ dysrhythmias Atrial Fibrillation  Rhythm:Regular Rate:Normal  ECG: NSR, rate 72  CATH: Moderate three-vessel coronary artery disease.\60 to 70% mid RCA and 95% stenosis of the distal right coronary beyond the origin of the PDA. Normal left main Mid LAD 60 to 70% stenosis beyond the origin of the dominant first diagonal. Large ramus intermedius with luminal irregularity. Relatively small distribution circumflex with 70% obstruction in the first marginal and total occlusion of the distal circumflex before the origin of the small second obtuse marginal.  There are faint left to left collaterals to the distal circumflex. Moderately severe left ventricular dysfunction with global hypokinesis, EF 35%.  Normal LVEDP.   ECHO: Moderate to severe global reduction in LV systolic function; mild LVE; mild LVH; trace AI; mild MR.  Sees cardiologist Tamala Julian)   Neuro/Psych  Headaches, negative psych ROS   GI/Hepatic negative GI ROS, Neg liver ROS,   Endo/Other  diabetes, Oral Hypoglycemic Agents  Renal/GU negative Renal ROS     Musculoskeletal negative musculoskeletal ROS (+)   Abdominal (+) + obese,   Peds  Hematology HLD   Anesthesia Other Findings Right parotid neoplasm  Reproductive/Obstetrics                            Anesthesia Physical Anesthesia Plan  ASA:  III  Anesthesia Plan: General   Post-op Pain Management:    Induction: Intravenous  PONV Risk Score and Plan: 2 and Ondansetron, Dexamethasone, Midazolam and Treatment may vary due to age or medical condition  Airway Management Planned: Oral ETT  Additional Equipment:   Intra-op Plan:   Post-operative Plan: Extubation in OR  Informed Consent: I have reviewed the patients History and Physical, chart, labs and discussed the procedure including the risks, benefits and alternatives for the proposed anesthesia with the patient or authorized representative who has indicated his/her understanding and acceptance.   Dental advisory given  Plan Discussed with: CRNA  Anesthesia Plan Comments:         Anesthesia Quick Evaluation

## 2018-11-19 NOTE — Telephone Encounter (Signed)
Left message to return a cal to give BIPAP study appointment details.

## 2018-11-19 NOTE — Progress Notes (Signed)
Patient complaining of back pain- rates 4 out of 10. States he did not take his Norco today would like something for pain. Spoke with Dr. Roanna Banning and given verbal order for Fentanyl 2mcg Q75mins PRN pain with max dose of 124mcg.

## 2018-11-19 NOTE — Transfer of Care (Signed)
Immediate Anesthesia Transfer of Care Note  Patient: Casey [name].  Procedure(s) Performed: PAROTIDECTOMY (Right Neck)  Patient Location: PACU  Anesthesia Type:General  Level of Consciousness: awake, alert , oriented and patient cooperative  Airway & Oxygen Therapy: Patient Spontanous Breathing and Patient connected to nasal cannula oxygen  Post-op Assessment: Report given to RN, Post -op Vital signs reviewed and stable and Patient moving all extremities X 4  Post vital signs: Reviewed and stable  Last Vitals:  Vitals Value Taken Time  BP 147/91 11/19/2018  4:44 PM  Temp 37.2 C 11/19/2018  4:44 PM  Pulse 82 11/19/2018  4:46 PM  Resp 13 11/19/2018  4:46 PM  SpO2 91 % 11/19/2018  4:46 PM  Vitals shown include unvalidated device data.  Last Pain:  Vitals:   11/19/18 1644  TempSrc:   PainSc: (P) Asleep      Patients Stated Pain Goal: 2 (01/58/68 2574)  Complications: No apparent anesthesia complications

## 2018-11-19 NOTE — Op Note (Signed)
NAME: Casey [name], Casey [name] MEDICAL RECORD [id number] ACCOUNT [id number] DATE OF BIRTH:Sep 27, 1956 FACILITY: [facility] LOCATION: [facility] PHYSICIAN:Myan Locatelli Guido Sander, MD  OPERATIVE REPORT  DATE OF PROCEDURE:  11/19/2018  PREOPERATIVE DIAGNOSIS:  Right parotid neoplasm.  POSTOPERATIVE DIAGNOSIS:  Right parotid neoplasm.  PROCEDURE:  Right lateral parotidectomy with facial nerve dissection.  SURGEON:  Melida Quitter, MD.  ASSISTANT:  Jolene Provost, PA  ANESTHESIA:  General endotracheal anesthesia.  COMPLICATIONS:  None.  INDICATIONS:  The patient is a 62 year old male who has a mass in the right parotid gland that has been present for several months.  He presents to the operating room for surgical management.  FINDINGS:  There was a round mass in the lateral lobe of the parotid anterior to the tragus.  DESCRIPTION OF PROCEDURE:  The patient was identified in the holding room, informed consent having been obtained including discussion of risks, benefits, alternatives, the patient was brought to the operative suite, placed on table in supine position.   Anesthesia was induced and the patient was intubated by the anesthesia team without difficulty.  The patient was given intravenous antibiotics during the case.  The eyes were taped closed and the right neck was exposed.  The nerve integrity monitor was  placed on the face and turned on for the case.  The incision was marked with a marking pen and injected with 1% lidocaine with 1:100,000 epinephrine.  The right face and neck were prepped and draped in sterile fashion.  Incision was made with a #15 blade  scalpel through the skin and extended through the subcutaneous tissues using Bovie electrocautery.  A preplatysmal flap was elevated anteriorly and the earlobe was freed posteriorly.  These flaps were sewn back with stay sutures.  Dissection was then  performed along the tragal cartilage exposing the stylomastoid foramen region until the main  trunk of the facial nerve was identified.  This was then dissected in an antegrade fashion to the main division and the superior branches were then traced  keeping them in view and dividing the gland over the branches.  The mass was kept inferior to this dissection.  After the superior branches were dissected, the gland was elevated by dissecting under it.  The inferior branches were then dissected and  traced in an antegrade fashion and the gland further divided, keeping the mass with the lateral lobe.  After this was freed from the inferior branches, the gland was divided inferior to the facial nerve until the lateral lobe with mass were removed.   These were passed to nursing for pathology.  The wound was copiously irrigated with saline.  A 7-French round drain was placed in the depths of the wound and secured to the skin using 2-0 nylon suture in a standard drain stitch.  The wound was then  closed with 4-0 Vicryl suture in a simple running fashion in the subcutaneous layer and then 5-0 nylon suture in a simple running fashion in the skin layer.  The drain was hooked to suction and bacitracin ointment was added to the incision.  Drapes were  removed and the patient was cleaned off.  The drain was taped to the right shoulder for wakeup.  He was extubated and moved to recovery room in stable condition.  TN/NUANCE  D:11/19/2018 T:11/19/2018 JOB:[id number]

## 2018-11-19 NOTE — OR Nursing (Signed)
PT was source for blood exposure to staff member. Per hospital policy blood was drawn for exposure panel.

## 2018-11-19 NOTE — H&P (Signed)
Casey [name]. is an 62 y.o. male.   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  He presents for surgical management.  Past Medical History:  Diagnosis Date  . Arthritis   . Atrial fibrillation ([place]) 07/2018  . Complication of anesthesia    diff. waking up  . Diabetes mellitus   . Dysrhythmia 07/2018   A-Fib  . Headache(784.0)   . Hypertension    dr Marisue Humble   pcp  . Sleep apnea    prior to weight lose surgery-Dr. Don Perking not use CPAP    Past Surgical History:  Procedure Laterality Date  . APPENDECTOMY    . BACK SURGERY     5  back surgeries  . CARDIAC CATHETERIZATION    . CARDIOVERSION N/A 08/13/2018   Procedure: CARDIOVERSION;  Surgeon: Larey Dresser, MD;  Location: [facility] ENDOSCOPY;  Service: Cardiovascular;  Laterality: N/A;  . CARDIOVERSION N/A 09/02/2018   Procedure: CARDIOVERSION;  Surgeon: Thayer Headings, MD;  Location: [place];  Service: Cardiovascular;  Laterality: N/A;  . CARPAL TUNNEL RELEASE     bil  . COLON SURGERY     2004 for colon rupture  . HERNIA REPAIR    . LAPAROSCOPIC GASTRIC BANDING    . LEFT HEART CATH AND CORONARY ANGIOGRAPHY N/A 07/21/2018   Procedure: LEFT HEART CATH AND CORONARY ANGIOGRAPHY;  Surgeon: Belva Crome, MD;  Location: [place] CV LAB;  Service: Cardiovascular;  Laterality: N/A;  . MANDIBLE FRACTURE SURGERY     x 2  . TONSILLECTOMY      Family History  Problem Relation Age of Onset  . Diabetes Maternal Grandfather   . Heart disease Neg Hx    Social History:  reports that he quit smoking about 12 years ago. His smoking use included cigarettes. He has a 13.50 pack-year smoking history. He has never used smokeless tobacco. He reports that he does not drink alcohol or use drugs.  Allergies:  Allergies  Allergen Reactions  . Penicillins Hives    Has patient had a PCN reaction causing immediate rash, facial/tongue/throat swelling, SOB or lightheadedness  with hypotension: Unknown Has patient had a PCN reaction causing severe rash involving mucus membranes or skin necrosis: Unknown Has patient had a PCN reaction that required hospitalization: No Has patient had a PCN reaction occurring within the last 10 years: No Childhood reaction. If all of the above answers are "NO", then may proceed with Cephalosporin use.   [name] Percocet [Oxycodone-Acetaminophen] Anxiety and Other (See Comments)    Hyperactivity & unhinged.    Medications Prior to Admission  Medication Sig Dispense Refill  . apixaban (ELIQUIS) 5 MG TABS tablet Take 1 tablet (5 mg total) by mouth 2 (two) times daily. 60 tablet 3  . atorvastatin (LIPITOR) 10 MG tablet Take 10 mg by mouth at bedtime.   1  . carvedilol (COREG) 25 MG tablet Take 0.5 tablets (12.5 mg total) by mouth 2 (two) times daily. 60 tablet 3  . docusate sodium (COLACE) 100 MG capsule Take 100 mg by mouth 2 (two) times daily.     [name] dofetilide (TIKOSYN) 500 MCG capsule Take 1 capsule (500 mcg total) by mouth 2 (two) times daily. 60 capsule 6  . glimepiride (AMARYL) 2 MG tablet Take 2 mg by mouth daily with breakfast.    . HYDROcodone-acetaminophen (NORCO) 10-325 MG tablet Take 1 tablet by mouth every 4 (four) hours as needed (for  severe back pain.).    [name] liraglutide (VICTOZA) 18 MG/3ML SOPN Inject 1.8 mg into the skin every evening.     . magnesium gluconate (MAGONATE) 500 MG tablet Take 500 mg by mouth 2 (two) times daily.    . metFORMIN (GLUCOPHAGE-XR) 500 MG 24 hr tablet Take 1,000 mg by mouth 2 (two) times daily.  1  . Multiple Vitamin (MULTIVITAMIN WITH MINERALS) TABS tablet Take 1 tablet by mouth daily.    . sacubitril-valsartan (ENTRESTO) 49-51 MG Take 1 tablet by mouth 2 (two) times daily. 60 tablet 6  . spironolactone (ALDACTONE) 25 MG tablet Take 0.5 tablets (12.5 mg total) by mouth daily. 45 tablet 3    Results for orders placed or performed during the hospital encounter of 11/19/18 (from the past 48 hour(s))   Glucose, capillary     Status: Abnormal   Collection Time: 11/19/18  9:49 AM  Result Value Ref Range   Glucose-Capillary 139 (H) 70 - 99 mg/dL   Comment 1 Notify RN    Comment 2 Document in Chart   PT- INR Day of Surgery     Status: None   Collection Time: 11/19/18 10:11 AM  Result Value Ref Range   Prothrombin Time 14.3 11.4 - 15.2 seconds   INR 1.11     Comment: Performed at [facility] [address], [place], [place] [address]  Glucose, capillary     Status: Abnormal   Collection Time: 11/19/18 12:01 PM  Result Value Ref Range   Glucose-Capillary 113 (H) 70 - 99 mg/dL   Comment 1 Notify RN    Comment 2 Document in Chart    No results found.  Review of Systems  Musculoskeletal: Positive for back pain.  All other systems reviewed and are negative.   Blood pressure (!) 154/83, pulse 68, temperature (!) 97.5 F (36.4 C), temperature source Oral, resp. rate 18, height 5\' 10"  (1.778 m), weight 112.9 kg, SpO2 96 %. Physical Exam  Constitutional: He is oriented to person, place, and time. He appears well-developed and well-nourished. No distress.  HENT:  Head: Normocephalic and atraumatic.  Right Ear: External ear normal.  Left Ear: External ear normal.  Nose: Nose normal.  Mouth/Throat: Oropharynx is clear and moist.  Eyes: Pupils are equal, round, and reactive to light. Conjunctivae and EOM are normal.  Neck: Normal range of motion. Neck supple.  Right parotid mass anterior to tragus, about 2 cm.  Cardiovascular: Normal rate.  Respiratory: Effort normal.  Musculoskeletal: Normal range of motion.  Neurological: He is alert and oriented to person, place, and time. No cranial nerve deficit.  Skin: Skin is warm and dry.  Psychiatric: He has a normal mood and affect. His behavior is normal. Judgment and thought content normal.     Assessment/Plan Right parotid mass To OR for right parotidectomy.  Melida Quitter, MD 11/19/2018, 1:30 PM

## 2018-11-19 NOTE — Discharge Instructions (Addendum)
Resume Eliquis Sunday if not bleeding. OK to shower beginning later today Clean wound with Qtip and water twice daily, then apply a thin coat of antibiotic ointment Call for swelling, signs of infection Recheck Dr. Redmond Baseman Thursday.  Call [phone] for an appointment please

## 2018-11-19 NOTE — Brief Op Note (Signed)
11/19/2018  4:27 PM  PATIENT:  [name]  62 y.o. male  PRE-OPERATIVE DIAGNOSIS:  Right parotid neoplasm  POST-OPERATIVE DIAGNOSIS:  Right Parotid Neoplasm  PROCEDURE:  Procedure(s): PAROTIDECTOMY (Right)  SURGEON:  Surgeon(s) and Role:    Melida Quitter, MD - Primary  PHYSICIAN ASSISTANT: Sallee Provencal  ASSISTANTS: none   ANESTHESIA:   general  EBL:  30 mL   BLOOD ADMINISTERED:none  DRAINS: (7 Fr) Jackson-[name] drain(s) with closed bulb suction in the right neck   LOCAL MEDICATIONS USED:  LIDOCAINE   SPECIMEN:  Source of Specimen:  right parotid  DISPOSITION OF SPECIMEN:  PATHOLOGY  COUNTS:  YES  TOURNIQUET:  * No tourniquets in log *  DICTATION: .Other Dictation: Dictation Number [id number]  PLAN OF CARE: Admit for overnight observation  PATIENT DISPOSITION:  PACU - hemodynamically stable.   Delay start of Pharmacological VTE agent (>24hrs) due to surgical blood loss or risk of bleeding: yes

## 2018-11-19 NOTE — Anesthesia Procedure Notes (Signed)
Procedure Name: Intubation Date/Time: 11/19/2018 2:09 PM Performed by: Julieta Bellini, CRNA Pre-anesthesia Checklist: Patient identified, Emergency Drugs available, Suction available and Patient being monitored Patient Re-evaluated:Patient Re-evaluated prior to induction Oxygen Delivery Method: Circle system utilized Preoxygenation: Pre-oxygenation with 100% oxygen Induction Type: IV induction Ventilation: Mask ventilation without difficulty Laryngoscope Size: Mac and 4 Grade View: Grade II Tube type: Oral Tube size: 7.5 mm Number of attempts: 1 Airway Equipment and Method: Stylet Placement Confirmation: ETT inserted through vocal cords under direct vision,  positive ETCO2 and breath sounds checked- equal and bilateral Secured at: 22 cm Tube secured with: Tape Dental Injury: Teeth and Oropharynx as per pre-operative assessment

## 2018-11-20 DIAGNOSIS — D490 Neoplasm of unspecified behavior of digestive system: Secondary | ICD-10-CM | POA: Diagnosis not present

## 2018-11-20 LAB — GLUCOSE, CAPILLARY: Glucose-Capillary: 321 mg/dL — ABNORMAL HIGH (ref 70–99)

## 2018-11-20 NOTE — Plan of Care (Signed)

## 2018-11-20 NOTE — Discharge Summary (Signed)
11/20/2018 9:04 AM  Casey [name] [id number]  Post-Op Day 1 discharge summary    Temp:  [97.4 F (36.3 C)-99 F (37.2 C)] 97.4 F (36.3 C) (12/21 0639) Pulse Rate:  [59-88] 82 (12/21 0639) Resp:  [15-18] 16 (12/21 0639) BP: (104-154)/(60-91) 114/73 (12/21 0639) SpO2:  [94 %-96 %] 96 % (12/21 0639) Weight:  [112.9 kg] 112.9 kg (12/20 0942),     Intake/Output Summary (Last 24 hours) at 11/20/2018 0904 Last data filed at 11/20/2018 8469 Gross per 24 hour  Intake 1527.74 ml  Output 1565 ml  Net -37.26 ml    Results for orders placed or performed during the hospital encounter of 11/19/18 (from the past 24 hour(s))  Glucose, capillary     Status: Abnormal   Collection Time: 11/19/18  9:49 AM  Result Value Ref Range   Glucose-Capillary 139 (H) 70 - 99 mg/dL   Comment 1 Notify RN    Comment 2 Document in Chart   PT- INR Day of Surgery     Status: None   Collection Time: 11/19/18 10:11 AM  Result Value Ref Range   Prothrombin Time 14.3 11.4 - 15.2 seconds   INR 1.11   Glucose, capillary     Status: Abnormal   Collection Time: 11/19/18 12:01 PM  Result Value Ref Range   Glucose-Capillary 113 (H) 70 - 99 mg/dL   Comment 1 Notify RN    Comment 2 Document in Chart   Glucose, capillary     Status: Abnormal   Collection Time: 11/19/18  4:46 PM  Result Value Ref Range   Glucose-Capillary 124 (H) 70 - 99 mg/dL  Glucose, capillary     Status: Abnormal   Collection Time: 11/19/18  5:36 PM  Result Value Ref Range   Glucose-Capillary 153 (H) 70 - 99 mg/dL  Glucose, capillary     Status: Abnormal   Collection Time: 11/19/18  9:15 PM  Result Value Ref Range   Glucose-Capillary 259 (H) 70 - 99 mg/dL  Glucose, capillary     Status: Abnormal   Collection Time: 11/20/18  8:05 AM  Result Value Ref Range   Glucose-Capillary 321 (H) 70 - 99 mg/dL    SUBJECTIVE:  Min pain.  Breathing well. Pain controlled. Eating, drinking, voiding  OBJECTIVE:  Wound flat. Facial n intact.  Drain  removed without difficulty  IMPRESSION:  Satisfactory check  PLAN:   Discharge home  Admit: 20 DEC Discharge:  21 DEC Final Diagnosis: RIGHT parotid neoplasm Proc:  RIGHT superficial parotidectomy, 20 DEC Comp:  None Cond:  Ambulatory, eating drinking, voiding.  Pain controlled Recheck: 6 days Dr. Redmond Baseman Rx:  none Instructions written and given  Hosp Course:  Underwent surgery. Observed overnight.  Minimal wound drainage.  Drain removed and pt discharged to home and care of family on AM of POD 1.    Ileene Hutchinson [facility]

## 2018-11-20 NOTE — Progress Notes (Signed)
Casey [name]. to be D/C'd  per MD order. Discussed with the patient and all questions fully answered.  VSS, Skin clean, dry and intact without evidence of skin break down, no evidence of skin tears noted.  IV catheter discontinued intact. Site without signs and symptoms of complications. Dressing and pressure applied.  An After Visit Summary was printed and given to the patient. Patient received prescription.  D/c education completed with patient/family including follow up instructions, medication list, d/c activities limitations if indicated, with other d/c instructions as indicated by MD - patient able to verbalize understanding, all questions fully answered.   Patient instructed to return to ED, call 911, or call MD for any changes in condition.   Patient to be escorted via [place], and D/C home via private auto.

## 2018-11-22 ENCOUNTER — Encounter (HOSPITAL_COMMUNITY): Payer: Self-pay | Admitting: Otolaryngology

## 2018-11-22 NOTE — Anesthesia Postprocedure Evaluation (Signed)
Anesthesia Post Note  Patient: Casey [name].  Procedure(s) Performed: PAROTIDECTOMY (Right Neck)     Patient location during evaluation: PACU Anesthesia Type: General Level of consciousness: awake and alert Pain management: pain level controlled Vital Signs Assessment: post-procedure vital signs reviewed and stable Respiratory status: spontaneous breathing, nonlabored ventilation, respiratory function stable and patient connected to nasal cannula oxygen Cardiovascular status: blood pressure returned to baseline and stable Postop Assessment: no apparent nausea or vomiting Anesthetic complications: no    Last Vitals:  Vitals:   11/20/18 0157 11/20/18 0639  BP: 104/60 114/73  Pulse: (!) 59 82  Resp: 15 16  Temp: 36.4 C (!) 36.3 C  SpO2: 96% 96%    Last Pain:  Vitals:   11/20/18 0800  TempSrc:   PainSc: 0-No pain   Pain Goal: Patients Stated Pain Goal: 3 (11/19/18 2020)               Montez Hageman

## 2018-11-29 ENCOUNTER — Ambulatory Visit (HOSPITAL_COMMUNITY): Payer: PRIVATE HEALTH INSURANCE | Attending: Cardiology

## 2018-11-30 ENCOUNTER — Telehealth: Payer: Self-pay | Admitting: *Deleted

## 2018-11-30 NOTE — Telephone Encounter (Signed)
   [facility] Pre-operative Risk Assessment    Request for surgical clearance:  1. What type of surgery is being performed? EPIDURAL STEROID INJECTION C4-5   2. When is this surgery scheduled? 12/21/18   3. What type of clearance is required (medical clearance vs. Pharmacy clearance to hold med vs. Both)? BOTH  4. Are there any medications that need to be held prior to surgery and how long? ELIQUIS X 3 DAYS PRIOR  5. Practice name and name of physician performing surgery? [facility]; DR. HARKINS   6. What is your office phone number [phone]    7.   What is your office fax number [phone]  8.   Anesthesia type (None, local, MAC, general) ? LEFT MESSAGE FOR SURGEON'S OFFICE FOR ANESTHESIA IF ANY BEING USED   Julaine Hua 11/30/2018, 1:20 PM  _________________________________________________________________   (provider comments below)

## 2018-12-02 NOTE — Telephone Encounter (Signed)
Patient with diagnosis of Afib on Eliquis for anticoagulation.    Procedure: epidural steroid injection Date of procedure: 12/21/18  CHADS2-VASc score of  4 (CHF, HTN, AGE, DM2, stroke/tia x 2, CAD, AGE, male)  CrCl 129ml/min  Per office protocol, patient can hold Eliquis for 3 days prior to procedure.

## 2018-12-03 ENCOUNTER — Ambulatory Visit (HOSPITAL_COMMUNITY): Payer: PRIVATE HEALTH INSURANCE | Attending: Cardiology

## 2018-12-03 DIAGNOSIS — I5022 Chronic systolic (congestive) heart failure: Secondary | ICD-10-CM | POA: Diagnosis present

## 2018-12-03 NOTE — Telephone Encounter (Signed)
Follow Up    Pt called to return Dr Tiajuana Amass phone call. Please call back

## 2018-12-03 NOTE — Telephone Encounter (Signed)
LMTCB

## 2018-12-03 NOTE — Telephone Encounter (Signed)
   Primary Cardiologist: Sinclair Grooms, MD  Chart reviewed as part of pre-operative protocol coverage. Given past medical history and time since last visit, based on ACC/AHA guidelines, Casey [name]. would be at acceptable risk for the planned procedure without further cardiovascular testing.   OK to hold Eliquis 3 days pre op  I will route this recommendation to the requesting party via Epic fax function and remove from pre-op pool.  Please call with questions.  Kerin Ransom, PA-C 12/03/2018, 4:10 PM

## 2018-12-07 ENCOUNTER — Telehealth: Payer: Self-pay | Admitting: Interventional Cardiology

## 2018-12-07 ENCOUNTER — Encounter: Payer: Self-pay | Admitting: *Deleted

## 2018-12-07 NOTE — Telephone Encounter (Signed)
Follow Up:     Casey [name] from Dr Luan Pulling said they will use local for the pt's procedure.

## 2018-12-07 NOTE — Progress Notes (Signed)
Cardiology Office Note:    Date:  12/08/2018   ID:  Casey Garbe., DOB [birth year], MRN [id number]  PCP:  Orpah Melter, MD  Cardiologist:  Sinclair Grooms, MD   Referring MD: Orpah Melter, MD   Chief Complaint  Patient presents with  . Coronary Artery Disease  . Atrial Fibrillation    History of Present Illness:    Casey D Nash Bolls. is a 63 y.o. male with a hx of atrial fibrillation and coincidental finding of systolic left ventricular dysfunction with EF less than 35%. Recent cardiac catheterization did not reveal significant CAD.   Overall, Casey [name] is doing much better.  He is undergone successful cardioversion and is having rhythm control on dofetilide.  Exertional fatigue and dyspnea has completely abated.  Repeat echo as demonstrated below has shown return of LV function to the normal range with EF 55%.  He denies angina despite having moderate CAD.  He is attempting to control lipids with diet.  Last LDL was 98 in February 2019.  We discussed secondary risk prevention.  Past Medical History:  Diagnosis Date  . Arthritis   . Atrial fibrillation ([place]) 07/2018  . Complication of anesthesia    diff. waking up  . Diabetes mellitus   . Dysrhythmia 07/2018   A-Fib  . Headache(784.0)   . Hypertension    dr Marisue Humble   pcp  . Sleep apnea    prior to weight lose surgery-Dr. Don Perking not use CPAP    Past Surgical History:  Procedure Laterality Date  . APPENDECTOMY    . BACK SURGERY     5  back surgeries  . CARDIAC CATHETERIZATION    . CARDIOVERSION N/A 08/13/2018   Procedure: CARDIOVERSION;  Surgeon: Larey Dresser, MD;  Location: [facility] ENDOSCOPY;  Service: Cardiovascular;  Laterality: N/A;  . CARDIOVERSION N/A 09/02/2018   Procedure: CARDIOVERSION;  Surgeon: Thayer Headings, MD;  Location: [facility];  Service: Cardiovascular;  Laterality: N/A;  . CARPAL TUNNEL RELEASE     bil  . COLON SURGERY     2004 for colon rupture  . HERNIA REPAIR    .  LAPAROSCOPIC GASTRIC BANDING    . LEFT HEART CATH AND CORONARY ANGIOGRAPHY N/A 07/21/2018   Procedure: LEFT HEART CATH AND CORONARY ANGIOGRAPHY;  Surgeon: Belva Crome, MD;  Location: [place] CV [facility];  Service: Cardiovascular;  Laterality: N/A;  . MANDIBLE FRACTURE SURGERY     x 2  . PAROTIDECTOMY Right 11/19/2018   Procedure: PAROTIDECTOMY;  Surgeon: Melida Quitter, MD;  Location: [place];  Service: ENT;  Laterality: Right;  . TONSILLECTOMY      Current Medications: Current Meds  Medication Sig  . apixaban (ELIQUIS) 5 MG TABS tablet Take 1 tablet (5 mg total) by mouth 2 (two) times daily.  [name] atorvastatin (LIPITOR) 10 MG tablet Take 10 mg by mouth at bedtime.   . carvedilol (COREG) 25 MG tablet Take 0.5 tablets (12.5 mg total) by mouth 2 (two) times daily.  [name] docusate sodium (COLACE) 100 MG capsule Take 100 mg by mouth 2 (two) times daily.   [name] dofetilide (TIKOSYN) 500 MCG capsule Take 1 capsule (500 mcg total) by mouth 2 (two) times daily.  [name] glimepiride (AMARYL) 2 MG tablet Take 2 mg by mouth daily with breakfast.  . HYDROcodone-acetaminophen (NORCO) 10-325 MG tablet Take 1 tablet by mouth every 4 (four) hours as needed (for severe back pain.).  [name] liraglutide (VICTOZA) 18 MG/3ML SOPN Inject 1.8 mg  into the skin every evening.   . magnesium gluconate (MAGONATE) 500 MG tablet Take 500 mg by mouth 2 (two) times daily.  . metFORMIN (GLUCOPHAGE-XR) 500 MG 24 hr tablet Take 1,000 mg by mouth 2 (two) times daily.  . Multiple Vitamin (MULTIVITAMIN WITH MINERALS) TABS tablet Take 1 tablet by mouth daily.  . sacubitril-valsartan (ENTRESTO) 49-51 MG Take 1 tablet by mouth 2 (two) times daily.  [name] spironolactone (ALDACTONE) 25 MG tablet Take 0.5 tablets (12.5 mg total) by mouth daily.     Allergies:   Penicillins and Percocet [oxycodone-acetaminophen]   Social History   Socioeconomic History  . Marital status: Married    Spouse name: Not on file  . Number of children: Not on file  . Years of  education: Not on file  . Highest education level: Not on file  Occupational History  . Not on file  Social Needs  . Financial resource strain: Not on file  . Food insecurity:    Worry: Not on file    Inability: Not on file  . Transportation needs:    Medical: Not on file    Non-medical: Not on file  Tobacco Use  . Smoking status: Former Smoker    Packs/day: 0.90    Years: 15.00    Pack years: 13.50    Types: Cigarettes    Last attempt to quit: 01/19/2006    Years since quitting: 12.8  . Smokeless tobacco: Never Used  Substance and Sexual Activity  . Alcohol use: No  . Drug use: No  . Sexual activity: Not on file  Lifestyle  . Physical activity:    Days per week: Not on file    Minutes per session: Not on file  . Stress: Not on file  Relationships  . Social connections:    Talks on phone: Not on file    Gets together: Not on file    Attends religious service: Not on file    Active member of club or organization: Not on file    Attends meetings of clubs or organizations: Not on file    Relationship status: Not on file  Other Topics Concern  . Not on file  Social History Narrative  . Not on file     Family History: The patient's family history includes Diabetes in his maternal grandfather. There is no history of Heart disease.  ROS:   Please see the history of present illness.    Appreciative for improvements noted.  Denies syncope.  Able to lie flat.  Has back discomfort and leg pain if too much activity.  Sleeping well.  All other systems reviewed and are negative.  EKGs/Labs/Other Studies Reviewed:    The following studies were reviewed today: Cardiac catheterization August 2019: Diagnostic  Dominance: Right    Intervention   2D Doppler echocardiogram 12/03/2018: ------------------------------------------------------------------- Study Conclusions  - Left ventricle: The cavity size was normal. Systolic function was   normal. Wall motion was normal;  there were no regional wall   motion abnormalities. Doppler parameters are consistent with   abnormal left ventricular relaxation (grade 1 diastolic   dysfunction). Doppler parameters are consistent with   indeterminate ventricular filling pressure. - Aortic valve: Transvalvular velocity was within the normal range.   There was no stenosis. There was mild regurgitation. - Aorta: Ascending aortic diameter: 37 mm (S). - Ascending aorta: The ascending aorta was mildly dilated. - Mitral valve: Transvalvular velocity was within the normal range.   There was no evidence for  stenosis. There was no regurgitation. - Right ventricle: The cavity size was normal. Wall thickness was   normal. Systolic function was normal. - Atrial septum: No defect or patent foramen ovale was identified. - Tricuspid valve: Transvalvular velocity was within the normal   range. There was no regurgitation. - Pulmonary arteries: Systolic pressure could not be accurately   estimated. - Pericardium, extracardiac: A trivial pericardial effusion was   identified.  EKG:  EKG is not repeated today.  Clinically in sinus rhythm based on auscultation.  Recent Labs: 10/06/2018: Magnesium 2.0 11/15/2018: BUN 14; Creatinine, Ser 0.85; Hemoglobin 14.7; Platelets 189; Potassium 3.9; Sodium 136  Recent Lipid Panel No results found for: CHOL, TRIG, HDL, CHOLHDL, VLDL, LDLCALC, LDLDIRECT  Physical Exam:    VS:  BP 126/82   Pulse (!) 56   Ht 5\' 10"  (1.778 m)   Wt 258 lb (117 kg)   SpO2 97%   BMI 37.02 kg/m     Wt Readings from Last 3 Encounters:  12/08/18 258 lb (117 kg)  11/19/18 249 lb (112.9 kg)  11/15/18 259 lb 3.2 oz (117.6 kg)     GEN: Obese.. No acute distress HEENT: Normal NECK: No JVD. LYMPHATICS: No lymphadenopathy CARDIAC: RRR.  No murmur, gallop, edema VASCULAR: Pulses are 2+ and symmetric in radial position., Bruits are absent. RESPIRATORY:  Clear to auscultation without rales, wheezing or rhonchi    ABDOMEN: Soft, non-tender, non-distended, No pulsatile mass, MUSCULOSKELETAL: No deformity  SKIN: Warm and dry NEUROLOGIC:  Alert and oriented x 3 PSYCHIATRIC:  Normal affect   ASSESSMENT:    1. Persistent atrial fibrillation   2. Chronic systolic heart failure ([place])   3. Uncontrolled type 2 diabetes mellitus with hyperglycemia, without long-term current use of insulin ([place])   4. Visit for monitoring Tikosyn therapy   5. Hyperlipidemia with target LDL less than 70    PLAN:    In order of problems listed above:  1. Resolved after cardioversion and now on dofetilide therapy for rhythm control. 2. HFrEF has transition toHFpEF with EF greater than 50% on guideline directed therapy.  Continue current therapy.  Weight loss.  Consider sleep study. 3. A1c needs to be less than 7.  Encourage physical activity. 4. No clinical issues on Tikosyn therapy. 5. Last LDL was above target which should be less than 70.  Lipid panel was obtained today.  May need to increase intensity of statin therapy to achieve target.  Overall education and awareness concerning primary/secondary risk prevention was discussed in detail: LDL less than 70, hemoglobin A1c less than 7, blood pressure target less than 130/80 mmHg, >150 minutes of moderate aerobic activity per week, avoidance of smoking, weight control (via diet and exercise), and continued surveillance/management of/for obstructive sleep apnea.  Need to investigate whether a sleep study is ever been performed.  He has a body habitus.  Medication Adjustments/Labs and Tests Ordered: Current medicines are reviewed at length with the patient today.  Concerns regarding medicines are outlined above.  Orders Placed This Encounter  Procedures  . Hepatic function panel  . Lipid panel   No orders of the defined types were placed in this encounter.   Patient Instructions  Medication Instructions:  No change If you need a refill on your cardiac medications  before your next appointment, please call your pharmacy.   Lab work: Today: lipids/liver function If you have labs (blood work) drawn today and your tests are completely normal, you will receive your results only by: [name]  MyChart Message (if you have MyChart) OR . A paper copy in the mail If you have any lab test that is abnormal or we need to change your treatment, we will call you to review the results.  Testing/Procedures: none  Follow-Up: At [facility], you and your health needs are our priority.  As part of our continuing mission to provide you with exceptional heart care, we have created designated Provider Care Teams.  These Care Teams include your primary Cardiologist (physician) and Advanced Practice Providers (APPs -  Physician Assistants and Nurse Practitioners) who all work together to provide you with the care you need, when you need it. You will need a follow up appointment in 6 months.  Please call our office 2 months in advance to schedule this appointment.  You may see Sinclair Grooms, MD or one of the following Advanced Practice Providers on your designated Care Team:   Truitt Merle, NP Cecilie Kicks, NP . Kathyrn Drown, NP  Any Other Special Instructions Will Be Listed Below (If Applicable). Try to achieve 150 min of aerobic exercise weekly      Signed, Sinclair Grooms, MD  12/08/2018 11:09 AM    [place]

## 2018-12-08 ENCOUNTER — Encounter: Payer: Self-pay | Admitting: Interventional Cardiology

## 2018-12-08 ENCOUNTER — Ambulatory Visit (INDEPENDENT_AMBULATORY_CARE_PROVIDER_SITE_OTHER): Payer: PRIVATE HEALTH INSURANCE | Admitting: Interventional Cardiology

## 2018-12-08 VITALS — BP 126/82 | HR 56 | Ht 70.0 in | Wt 258.0 lb

## 2018-12-08 DIAGNOSIS — E785 Hyperlipidemia, unspecified: Secondary | ICD-10-CM

## 2018-12-08 DIAGNOSIS — E1165 Type 2 diabetes mellitus with hyperglycemia: Secondary | ICD-10-CM | POA: Diagnosis not present

## 2018-12-08 DIAGNOSIS — I4819 Other persistent atrial fibrillation: Secondary | ICD-10-CM | POA: Diagnosis not present

## 2018-12-08 DIAGNOSIS — I5022 Chronic systolic (congestive) heart failure: Secondary | ICD-10-CM | POA: Diagnosis not present

## 2018-12-08 DIAGNOSIS — Z79899 Other long term (current) drug therapy: Secondary | ICD-10-CM

## 2018-12-08 DIAGNOSIS — Z5181 Encounter for therapeutic drug level monitoring: Secondary | ICD-10-CM | POA: Diagnosis not present

## 2018-12-08 LAB — LIPID PANEL
Chol/HDL Ratio: 2.9 ratio (ref 0.0–5.0)
Cholesterol, Total: 129 mg/dL (ref 100–199)
HDL: 45 mg/dL (ref >39)
LDL Calculated: 66 mg/dL (ref 0–99)
Triglycerides: 89 mg/dL (ref 0–149)
VLDL Cholesterol Cal: 18 mg/dL (ref 5–40)

## 2018-12-08 LAB — HEPATIC FUNCTION PANEL
ALT: 18 IU/L (ref 0–44)
AST: 16 IU/L (ref 0–40)
Albumin: 4.3 g/dL (ref 3.6–4.8)
Alkaline Phosphatase: 73 IU/L (ref 39–117)
Bilirubin Total: 0.5 mg/dL (ref 0.0–1.2)
Bilirubin, Direct: 0.17 mg/dL (ref 0.00–0.40)
Total Protein: 7.3 g/dL (ref 6.0–8.5)

## 2018-12-08 NOTE — Patient Instructions (Signed)
Medication Instructions:  No change If you need a refill on your cardiac medications before your next appointment, please call your pharmacy.   Lab work: Today: lipids/liver function If you have labs (blood work) drawn today and your tests are completely normal, you will receive your results only by: [name] MyChart Message (if you have MyChart) OR . A paper copy in the mail If you have any lab test that is abnormal or we need to change your treatment, we will call you to review the results.  Testing/Procedures: none  Follow-Up: At [facility], you and your health needs are our priority.  As part of our continuing mission to provide you with exceptional heart care, we have created designated Provider Care Teams.  These Care Teams include your primary Cardiologist (physician) and Advanced Practice Providers (APPs -  Physician Assistants and Nurse Practitioners) who all work together to provide you with the care you need, when you need it. You will need a follow up appointment in 6 months.  Please call our office 2 months in advance to schedule this appointment.  You may see Sinclair Grooms, MD or one of the following Advanced Practice Providers on your designated Care Team:   Truitt Merle, NP Cecilie Kicks, NP . Kathyrn Drown, NP  Any Other Special Instructions Will Be Listed Below (If Applicable). Try to achieve 150 min of aerobic exercise weekly

## 2018-12-10 ENCOUNTER — Ambulatory Visit: Payer: PRIVATE HEALTH INSURANCE | Attending: Cardiovascular Disease | Admitting: Cardiovascular Disease

## 2018-12-10 DIAGNOSIS — G4733 Obstructive sleep apnea (adult) (pediatric): Secondary | ICD-10-CM

## 2018-12-10 NOTE — Telephone Encounter (Signed)
   Primary Cardiologist: Belva Crome III, MD  Chart reviewed as part of pre-operative protocol coverage in pre-op box.  Clearance already addressed in separate phone note by L Kilroy PA-C.   Will remove msg from preop box.  Charlie Pitter, PA-C 12/10/2018, 9:47 AM

## 2018-12-14 ENCOUNTER — Telehealth: Payer: Self-pay | Admitting: *Deleted

## 2018-12-14 NOTE — Telephone Encounter (Signed)
Called patient to inform him that he had BIPAP titration study scheduled on 01/04/19. He tells me that he saw the appointment on his my chart and called to have it moved up. He had it done this past Friday, January 10th. I informed him I was not aware of the appointment change and this could cause a financial problem for him. Neither him nor the sleep lab called to notify me of this. His [employer] authorization didn't began until 01/01/19. The dates are clearly noted on the appointment notes. I'm not sure who rescheduled the appointment, and why this was overlooked.

## 2018-12-27 ENCOUNTER — Encounter: Payer: Self-pay | Admitting: Cardiovascular Disease

## 2018-12-27 NOTE — Procedures (Signed)
[facility]       Patient Name: Casey, [name] Date: 12/10/2018 Gender: Male D.O.B: 02-06-1956 Age (years): 53 Referring Provider: Shelva Majestic MD, ABSM Height (inches): 70 Interpreting Physician: Shelva Majestic MD, ABSM Weight (lbs): 263 RPSGT: Peak, Robert BMI: 38 MRN: [id number] Neck Size: 19.50  CLINICAL INFORMATION The patient is referred for a BiPAP titration to treat sleep apnea.  Date of NPSG, Split Night or HST: 10/29/2018: AHI 17/h; RDI 25.3/h; O2 desaturation to 85%. CPAP titration was suboptimal.  SLEEP STUDY TECHNIQUE As per the AASM Manual for the Scoring of Sleep and Associated Events v2.3 (April 2016) with a hypopnea requiring 4% desaturations.  The channels recorded and monitored were frontal, central and occipital EEG, electrooculogram (EOG), submentalis EMG (chin), nasal and oral airflow, thoracic and abdominal wall motion, anterior tibialis EMG, snore microphone, electrocardiogram, and pulse oximetry. Bilevel positive airway pressure (BPAP) was initiated at the beginning of the study and titrated to treat sleep-disordered breathing.  MEDICATIONS     apixaban (ELIQUIS) 5 MG TABS tablet         atorvastatin (LIPITOR) 10 MG tablet         carvedilol (COREG) 25 MG tablet         docusate sodium (COLACE) 100 MG capsule         dofetilide (TIKOSYN) 500 MCG capsule         glimepiride (AMARYL) 2 MG tablet         HYDROcodone-acetaminophen (NORCO) 10-325 MG tablet         liraglutide (VICTOZA) 18 MG/3ML SOPN         magnesium gluconate (MAGONATE) 500 MG tablet         metFORMIN (GLUCOPHAGE-XR) 500 MG 24 hr tablet         Multiple Vitamin (MULTIVITAMIN WITH MINERALS) TABS tablet         sacubitril-valsartan (ENTRESTO) 49-51 MG         spironolactone (ALDACTONE) 25 MG tablet      Medications self-administered by patient taken the night of the study : N/A  RESPIRATORY PARAMETERS Optimal IPAP Pressure (cm): 12 AHI at Optimal  Pressure (/hr) N/A Optimal EPAP Pressure (cm): 8   Overall Minimal O2 (%): 88.0 Minimal O2 at Optimal Pressure (%): 88.0  SLEEP ARCHITECTURE Start Time: 10:15:38 PM Stop Time: 4:58:18 AM Total Time (min): 402.7 Total Sleep Time (min): 333 Sleep Latency (min): 5.9 Sleep Efficiency (%): 82.7% REM Latency (min): 69.5 WASO (min): 63.7 Stage N1 (%): 7.4% Stage N2 (%): 63.7% Stage N3 (%): 0.0% Stage R (%): 29 Supine (%): 51.35 Arousal Index (/hr): 11.4    CARDIAC DATA The 2 lead EKG demonstrated sinus rhythm. The mean heart rate was 51.6 beats per minute. Other EKG findings include: None.  LEG MOVEMENT DATA The total Periodic Limb Movements of Sleep (PLMS) were 0. The PLMS index was 0.0. A PLMS index of <15 is considered normal in adults.  IMPRESSIONS - CPAP was initiated at 6, BiPAP at 8/4 and was titrated to 11/7 cm of water. AHI at 11/7 was 0.4/h with O2 desaturation to a nadir of 92%.  - Central sleep apnea was not noted during this titration at 9/5. (CAI = 0.7/h). - Mild oxygen desaturations to a nadir of 88.0% at 9/5 cm water. - No snoring was audible during this study. - No cardiac abnormalities were observed during this study. - Clinically significant periodic limb movements were  not noted during this study. Arousals associated with PLMs were rare.  DIAGNOSIS - Obstructive Sleep Apnea (327.23 [G47.33 ICD-10])  RECOMMENDATIONS - Recommend an initial trial of BiPAP therapy on 11/7 cm H2O with heated humidification. A Medium size Resmed Full Face Mask AirFit F30 mask was used for the titration.  - Efforts should be made to optijize nasal and oropharyngeal patency. - Avoid alcohol, sedatives and other CNS depressants that may worsen sleep apnea and disrupt normal sleep architecture. - Sleep hygiene should be reviewed to assess factors that may improve sleep quality. - Weight management and regular exercise should be initiated or continued. - Return to Sleep Center for re-evaluation  after 4 weeks of therapy  [Electronically signed] 12/27/2018 06:13 PM  Shelva Majestic MD, [facility], ABSM Diplomate, American Board of Sleep Medicine   NPI: [id number]  [place] PH: [phone]   FX: [phone] [place]

## 2018-12-29 ENCOUNTER — Telehealth: Payer: Self-pay | Admitting: *Deleted

## 2018-12-29 NOTE — Telephone Encounter (Signed)
Patient notified CPAP titration has been completed. BIPAP will be ordered. Patient requesting order to be sent to [facility].

## 2018-12-29 NOTE — Telephone Encounter (Signed)
-----   Message from Troy Sine, MD sent at 12/27/2018  6:19 PM EST ----- Mariann Laster, please notify pt and DME set up for BiPAP with sleep clinic f/u.

## 2018-12-29 NOTE — Telephone Encounter (Signed)
BIPAP orders faxed to [facility]. [facility] does not take patient's [employer]. Patient notified.

## 2019-01-04 ENCOUNTER — Encounter (HOSPITAL_BASED_OUTPATIENT_CLINIC_OR_DEPARTMENT_OTHER): Payer: PRIVATE HEALTH INSURANCE

## 2019-01-07 ENCOUNTER — Encounter (HOSPITAL_COMMUNITY): Payer: Self-pay

## 2019-01-07 ENCOUNTER — Encounter (HOSPITAL_COMMUNITY): Payer: Self-pay | Admitting: Nurse Practitioner

## 2019-01-07 ENCOUNTER — Other Ambulatory Visit (HOSPITAL_COMMUNITY): Payer: Self-pay | Admitting: *Deleted

## 2019-01-07 ENCOUNTER — Ambulatory Visit (HOSPITAL_COMMUNITY)
Admission: RE | Admit: 2019-01-07 | Discharge: 2019-01-07 | Disposition: A | Discharge status: Home / Self Care | Payer: PRIVATE HEALTH INSURANCE | Source: Ambulatory Visit | Attending: Nurse Practitioner | Admitting: Nurse Practitioner

## 2019-01-07 VITALS — BP 132/76 | HR 75 | Ht 70.0 in | Wt 258.6 lb

## 2019-01-07 DIAGNOSIS — Z79899 Other long term (current) drug therapy: Secondary | ICD-10-CM | POA: Diagnosis not present

## 2019-01-07 DIAGNOSIS — Z7984 Long term (current) use of oral hypoglycemic drugs: Secondary | ICD-10-CM | POA: Diagnosis not present

## 2019-01-07 DIAGNOSIS — Z880 Allergy status to penicillin: Secondary | ICD-10-CM | POA: Insufficient documentation

## 2019-01-07 DIAGNOSIS — G4733 Obstructive sleep apnea (adult) (pediatric): Secondary | ICD-10-CM | POA: Diagnosis not present

## 2019-01-07 DIAGNOSIS — Z7901 Long term (current) use of anticoagulants: Secondary | ICD-10-CM | POA: Diagnosis not present

## 2019-01-07 DIAGNOSIS — Z833 Family history of diabetes mellitus: Secondary | ICD-10-CM | POA: Diagnosis not present

## 2019-01-07 DIAGNOSIS — I110 Hypertensive heart disease with heart failure: Secondary | ICD-10-CM | POA: Diagnosis not present

## 2019-01-07 DIAGNOSIS — E119 Type 2 diabetes mellitus without complications: Secondary | ICD-10-CM | POA: Insufficient documentation

## 2019-01-07 DIAGNOSIS — I4819 Other persistent atrial fibrillation: Secondary | ICD-10-CM | POA: Diagnosis present

## 2019-01-07 DIAGNOSIS — I5022 Chronic systolic (congestive) heart failure: Secondary | ICD-10-CM | POA: Insufficient documentation

## 2019-01-07 DIAGNOSIS — Z885 Allergy status to narcotic agent status: Secondary | ICD-10-CM | POA: Insufficient documentation

## 2019-01-07 DIAGNOSIS — I2510 Atherosclerotic heart disease of native coronary artery without angina pectoris: Secondary | ICD-10-CM | POA: Diagnosis not present

## 2019-01-07 DIAGNOSIS — Z9884 Bariatric surgery status: Secondary | ICD-10-CM | POA: Insufficient documentation

## 2019-01-07 DIAGNOSIS — Z87891 Personal history of nicotine dependence: Secondary | ICD-10-CM | POA: Diagnosis not present

## 2019-01-07 LAB — BASIC METABOLIC PANEL
Anion gap: 9 (ref 5–15)
BUN: 17 mg/dL (ref 8–23)
CO2: 27 mmol/L (ref 22–32)
Calcium: 9.7 mg/dL (ref 8.9–10.3)
Chloride: 103 mmol/L (ref 98–111)
Creatinine, Ser: 0.94 mg/dL (ref 0.61–1.24)
GFR calc Af Amer: >60 mL/min (ref >60)
GFR calc non Af Amer: >60 mL/min (ref >60)
Glucose, Bld: 159 mg/dL — ABNORMAL HIGH (ref 70–99)
Potassium: 4.1 mmol/L (ref 3.5–5.1)
Sodium: 139 mmol/L (ref 135–145)

## 2019-01-07 LAB — MAGNESIUM: Magnesium: 1.6 mg/dL — ABNORMAL LOW (ref 1.7–2.4)

## 2019-01-07 NOTE — Progress Notes (Signed)
Primary Care Physician: Orpah Melter, MD Referring Physician: Dr. Fransico Him   Safir D Brooklyn Jeff. is a 63 y.o. male with a h/o obesity, s/p gastric sleeve, HTN, DM that presented for pre op for rt parotidectomy 8/8 and was found to be in rate controlled afib, from which he was asymptomatic. He was sent to the afib clinic for evaluation. 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 He has felt some fatigue, less stamina for several months. Was just seen by PCP yesterday and nothing out of the ordinary was noted with his heart rhythm. He denies any exertional chest pain.   He does not drink alcohol, no tobacco use,no excessive caffeine. He has kept his weight stable for several years. Was almost 100 lbs heavier before gastric sleeve.  I discussed with Dr. Rayann Heman and he recommended an echo and if ok, he would be considered at low risk for surgery and could start anticoagulation after surgery. However, pt is now back in the office as his echo did show a reduced EF at 30-35%. Dr. Rayann Heman discussed with Dr. Redmond Baseman and it is felt most appropriate  to delay surgery in order to obtain  LHC to further determine his risk for surgery. He does have cardiac risk factors for CAD , ie , obesity,  HTN, DM. He denies any exertional chest pain but c/o fatigue/ low stamina  for several months/early summer. LHC did show 3 vessel  moderate  CAD  F/u in afib clinic, he is now been on anticoagulation x 2 weeks without interruption and can now schedule cardioversion after another week, he is in agreement.  F/u in afib clinic,9/20, he unfortunately had unsuccessful cardioversion and is back in the clinic to discuss antiarrythmic's.It was decided that he would come into the hospital for Tikosyn after he checked on the price of the drug.  F/u in afib clinic, 10/1, for admission for Tikosyn.  He will get the drug free for the rest  of the year as he had met his out of pocket deductible.   F/u in afib clinic 10/11. He is staying in [place] on [place]. He feels improved. His reduced EF meds have been titrated by Dr. Tamala Julian.  F/u in afib clinic,01/07/19. He is in SR and has been doing well staying in rhythm on dofetilide. He ultimately had his parotid gland successfully removed and did not have any issues with the surgery, he was benign. He had a sleep study and it was positive for OSA. He is suppose to pick up his equipment soon.He has had an echo since restoring SR and has shown normalization of EF.  Today, he denies symptoms of palpitations, chest pain, shortness of breath, orthopnea, PND, lower extremity edema, dizziness, presyncope, syncope, or neurologic sequela.  The patient is tolerating medications without difficulties and is otherwise without complaint today.   Past Medical History:  Diagnosis Date  . Arthritis   . Atrial fibrillation ([place]) 07/2018  . Complication of anesthesia    diff. waking up  . Diabetes mellitus   . Dysrhythmia 07/2018   A-Fib  . Headache(784.0)   . Hypertension    dr Marisue Humble   pcp  . Sleep apnea    prior to weight lose surgery-Dr. Don Perking not use CPAP   Past Surgical History:  Procedure Laterality Date  . APPENDECTOMY    . BACK SURGERY  5  back surgeries  . CARDIAC CATHETERIZATION    . CARDIOVERSION N/A 08/13/2018   Procedure: CARDIOVERSION;  Surgeon: Larey Dresser, MD;  Location: [facility] ENDOSCOPY;  Service: Cardiovascular;  Laterality: N/A;  . CARDIOVERSION N/A 09/02/2018   Procedure: CARDIOVERSION;  Surgeon: Thayer Headings, MD;  Location: [facility];  Service: Cardiovascular;  Laterality: N/A;  . CARPAL TUNNEL RELEASE     bil  . COLON SURGERY     2004 for colon rupture  . HERNIA REPAIR    . LAPAROSCOPIC GASTRIC BANDING    . LEFT HEART CATH AND CORONARY ANGIOGRAPHY N/A 07/21/2018   Procedure: LEFT HEART CATH AND CORONARY ANGIOGRAPHY;  Surgeon: Belva Crome, MD;   Location: [facility] CV LAB;  Service: Cardiovascular;  Laterality: N/A;  . MANDIBLE FRACTURE SURGERY     x 2  . PAROTIDECTOMY Right 11/19/2018   Procedure: PAROTIDECTOMY;  Surgeon: Melida Quitter, MD;  Location: [facility];  Service: ENT;  Laterality: Right;  . TONSILLECTOMY      Current Outpatient Medications  Medication Sig Dispense Refill  . apixaban (ELIQUIS) 5 MG TABS tablet Take 1 tablet (5 mg total) by mouth 2 (two) times daily. 60 tablet 3  . atorvastatin (LIPITOR) 10 MG tablet Take 10 mg by mouth at bedtime.   1  . carvedilol (COREG) 25 MG tablet Take 0.5 tablets (12.5 mg total) by mouth 2 (two) times daily. 60 tablet 3  . docusate sodium (COLACE) 100 MG capsule Take 100 mg by mouth 2 (two) times daily.     [name] dofetilide (TIKOSYN) 500 MCG capsule Take 1 capsule (500 mcg total) by mouth 2 (two) times daily. 60 capsule 6  . glimepiride (AMARYL) 2 MG tablet Take 2 mg by mouth daily with breakfast.    . HYDROcodone-acetaminophen (NORCO) 10-325 MG tablet Take 1 tablet by mouth every 4 (four) hours as needed (for severe back pain.).    [name] liraglutide (VICTOZA) 18 MG/3ML SOPN Inject 1.8 mg into the skin every evening.     . magnesium gluconate (MAGONATE) 500 MG tablet Take 500 mg by mouth daily.     . metFORMIN (GLUCOPHAGE-XR) 500 MG 24 hr tablet Take 1,000 mg by mouth 2 (two) times daily.  1  . Multiple Vitamin (MULTIVITAMIN WITH MINERALS) TABS tablet Take 1 tablet by mouth daily.    . sacubitril-valsartan (ENTRESTO) 49-51 MG Take 1 tablet by mouth 2 (two) times daily. 60 tablet 6  . spironolactone (ALDACTONE) 25 MG tablet Take 0.5 tablets (12.5 mg total) by mouth daily. 45 tablet 3   No current facility-administered medications for this encounter.     Allergies  Allergen Reactions  . Penicillins Hives    Has patient had a PCN reaction causing immediate rash, facial/tongue/throat swelling, SOB or lightheadedness with hypotension: Unknown Has patient had a PCN reaction causing severe rash  involving mucus membranes or skin necrosis: Unknown Has patient had a PCN reaction that required hospitalization: No Has patient had a PCN reaction occurring within the last 10 years: No Childhood reaction. If all of the above answers are "NO", then may proceed with Cephalosporin use.   [name] Percocet [Oxycodone-Acetaminophen] Anxiety and Other (See Comments)    Hyperactivity & unhinged. Previously tolerated hydrocodone    Social History   Socioeconomic History  . Marital status: Married    Spouse name: Not on file  . Number of children: Not on file  . Years of education: Not on file  . Highest education level: Not on file  Occupational History  . Not on file  Social Needs  . Financial resource strain: Not on file  . Food insecurity:    Worry: Not on file    Inability: Not on file  . Transportation needs:    Medical: Not on file    Non-medical: Not on file  Tobacco Use  . Smoking status: Former Smoker    Packs/day: 0.90    Years: 15.00    Pack years: 13.50    Types: Cigarettes    Last attempt to quit: 01/19/2006    Years since quitting: 12.9  . Smokeless tobacco: Never Used  Substance and Sexual Activity  . Alcohol use: No  . Drug use: No  . Sexual activity: Not on file  Lifestyle  . Physical activity:    Days per week: Not on file    Minutes per session: Not on file  . Stress: Not on file  Relationships  . Social connections:    Talks on phone: Not on file    Gets together: Not on file    Attends religious service: Not on file    Active member of club or organization: Not on file    Attends meetings of clubs or organizations: Not on file    Relationship status: Not on file  . Intimate partner violence:    Fear of current or ex partner: Not on file    Emotionally abused: Not on file    Physically abused: Not on file    Forced sexual activity: Not on file  Other Topics Concern  . Not on file  Social History Narrative  . Not on file    Family History    Problem Relation Age of Onset  . Diabetes Maternal Grandfather   . Heart disease Neg Hx     ROS- All systems are reviewed and negative except as per the HPI above  Physical Exam: Vitals:   01/07/19 1029  BP: 132/76  Pulse: 75  Weight: 117.3 kg  Height: 5' 10" (1.778 m)   Wt Readings from Last 3 Encounters:  01/07/19 117.3 kg  12/08/18 117 kg  11/19/18 112.9 kg    Labs: Lab Results  Component Value Date   NA 136 11/15/2018   K 3.9 11/15/2018   CL 103 11/15/2018   CO2 22 11/15/2018   GLUCOSE 138 (H) 11/15/2018   BUN 14 11/15/2018   CREATININE 0.85 11/15/2018   CALCIUM 9.2 11/15/2018   MG 2.0 10/06/2018   Lab Results  Component Value Date   INR 1.11 11/19/2018   Lab Results  Component Value Date   CHOL 129 12/08/2018   HDL 45 12/08/2018   LDLCALC 66 12/08/2018   TRIG 89 12/08/2018     GEN- The patient is well appearing, alert and oriented x 3 today.   Head- normocephalic, atraumatic Eyes-  Sclera clear, conjunctiva pink Ears- hearing intact Oropharynx- clear Neck- supple, no JVP Lymph- no cervical lymphadenopathy Lungs- Clear to ausculation bilaterally, normal work of breathing Heart-regular rate and rhythm, no murmurs, rubs or gallops, PMI not laterally displaced GI- soft, NT, ND, + BS Extremities- no clubbing, cyanosis, or edema MS- no significant deformity or atrophy Skin- no rash or lesion Psych- euthymic mood, full affect Neuro- strength and sensation are intact  EKG- NSR at 75 bpm, pr int 172 ms, qrs int 98 ms, qtc 435 ms Epic records reviewed Echo-Study Conclusions  - Left ventricle: The cavity size was mildly dilated. Wall   thickness was increased in a  pattern of mild LVH. Systolic   function was moderately to severely reduced. The estimated   ejection fraction was in the range of 30% to 35%. Diffuse   hypokinesis. - Aortic valve: There was trivial regurgitation. - Mitral valve: There was mild regurgitation.  Impressions: Echo-  11/2019-Study Conclusions  - Left ventricle: The cavity size was normal. Systolic function was   normal. Wall motion was normal; there were no regional wall   motion abnormalities. Doppler parameters are consistent with   abnormal left ventricular relaxation (grade 1 diastolic   dysfunction). Doppler parameters are consistent with   indeterminate ventricular filling pressure. - Aortic valve: Transvalvular velocity was within the normal range.   There was no stenosis. There was mild regurgitation. - Aorta: Ascending aortic diameter: 37 mm (S). - Ascending aorta: The ascending aorta was mildly dilated. - Mitral valve: Transvalvular velocity was within the normal range.   There was no evidence for stenosis. There was no regurgitation. - Right ventricle: The cavity size was normal. Wall thickness was   normal. Systolic function was normal. - Atrial septum: No defect or patent foramen ovale was identified. - Tricuspid valve: Transvalvular velocity was within the normal   range. There was no regurgitation. - Pulmonary arteries: Systolic pressure could not be accurately   estimated. - Pericardium, extracardiac: A trivial pericardial effusion was   identified. - Global longitudinal strain -15.1% (mildly abnormal).   LHC- 8/21-Moderate three-vessel coronary artery disease.  60 to 70% mid RCA and 95% stenosis of the distal right coronary beyond the origin of the PDA.  Normal left main  Mid LAD 60 to 70% stenosis beyond the origin of the dominant first diagonal.  Large ramus intermedius with luminal irregularity.  Relatively small distribution circumflex with 70% obstruction in the first marginal and total occlusion of the distal circumflex before the origin of the small second obtuse marginal.  There are faint left to left collaterals to the distal circumflex.  Moderately severe left ventricular dysfunction with global hypokinesis, EF 35%.  Normal LVEDP.    RECOMMENDATIONS:   In  absence of significant/limiting anginal symptoms, would recommend anti-ischemic therapy with beta-blockade and long-acting nitrates.  If symptoms develop or become refractory PCI of the very distal RCA, and LAD would be possible.  Lesions were not angiographically critical, and therefore based on data from the Courage trial, medical therapy would seem to be adequate.  Recommend guideline directed therapy for systolic heart failure: Transition to Entresto from ARB, transition Tenormin to carvedilol, add mineralocorticoid receptor antagonist (Aldactone or eplerenone).  With reference to heart failure, diabetes management should include an SGLT 2 agent to decrease incidence of heart failure symptoms.  Finally it may be helpful to have restoration of sinus rhythm.  LV dysfunction is out of proportion to the degree of coronary disease.   Recommend Aspirin 29m daily for moderate CAD.  Assessment and Plan: 1. Persistent  Afib Now staying in rhythm with tikosyn Continue drug at 500 mcg bid Qt stable Parotid gland surgery performed and benign Bmet/mag today Sleep study positive for OSA, pending start of cpap  2.CHA2DS2VASc score of 2(htn, DM)  Continue eliquis 5 mg bid   3. CAD / LV dysfunction  EF has normalized with restoring SR  F/u here in May Dr. STamala Julianin July  Donna C. Carroll, ADouble Spring Hospital[address]GNavarino Espy [phone]

## 2019-01-19 ENCOUNTER — Ambulatory Visit (HOSPITAL_COMMUNITY)
Admission: RE | Admit: 2019-01-19 | Discharge: 2019-01-19 | Disposition: A | Discharge status: Home / Self Care | Payer: PRIVATE HEALTH INSURANCE | Source: Ambulatory Visit | Attending: Nurse Practitioner | Admitting: Nurse Practitioner

## 2019-01-19 ENCOUNTER — Encounter (HOSPITAL_COMMUNITY): Payer: Self-pay

## 2019-01-19 DIAGNOSIS — I4819 Other persistent atrial fibrillation: Secondary | ICD-10-CM | POA: Diagnosis present

## 2019-01-19 LAB — MAGNESIUM: Magnesium: 1.9 mg/dL (ref 1.7–2.4)

## 2019-03-17 ENCOUNTER — Other Ambulatory Visit: Payer: Self-pay | Admitting: Interventional Cardiology

## 2019-03-21 ENCOUNTER — Other Ambulatory Visit: Payer: Self-pay | Admitting: Physician Assistant

## 2019-03-22 ENCOUNTER — Encounter (HOSPITAL_COMMUNITY): Payer: Self-pay

## 2019-03-31 ENCOUNTER — Encounter (HOSPITAL_COMMUNITY): Payer: Self-pay

## 2019-03-31 ENCOUNTER — Other Ambulatory Visit: Payer: Self-pay | Admitting: Interventional Cardiology

## 2019-04-01 ENCOUNTER — Ambulatory Visit (HOSPITAL_COMMUNITY)
Admission: RE | Admit: 2019-04-01 | Discharge: 2019-04-01 | Disposition: A | Discharge status: Home / Self Care | Payer: PRIVATE HEALTH INSURANCE | Source: Ambulatory Visit | Attending: Nurse Practitioner | Admitting: Nurse Practitioner

## 2019-04-01 ENCOUNTER — Other Ambulatory Visit: Payer: Self-pay

## 2019-04-01 ENCOUNTER — Encounter (HOSPITAL_COMMUNITY): Payer: Self-pay | Admitting: Nurse Practitioner

## 2019-04-01 ENCOUNTER — Encounter (HOSPITAL_COMMUNITY): Payer: Self-pay

## 2019-04-01 VITALS — BP 113/77 | HR 73 | Ht 70.0 in | Wt 254.5 lb

## 2019-04-01 DIAGNOSIS — I4819 Other persistent atrial fibrillation: Secondary | ICD-10-CM

## 2019-04-01 NOTE — Progress Notes (Signed)
Electrophysiology TeleHealth Note   Due to national recommendations of social distancing due to [place] 19, Audio/video telehealth visit is felt to be most appropriate for this patient at this time.  See MyChart message/consent below from today for patient consent regarding telehealth for the Atrial Fibrillation Clinic.    Date:  04/01/2019   ID:  Casey Garbe., DOB Dec 19, 1955, MRN [id number]  Location: home Provider location: [address] [place], [place] [address] Evaluation Performed: Follow up  PCP:  Orpah Melter, MD  Primary Cardiologist:  Dr. Tamala Julian Primary Electrophysiologist: Dr. Rayann Heman  CC:f/u afib on tikosyn   History of Present Illness: Casey D Jaylin Benzel. is a 63 y.o. male who presents via audio/video conferencing for a telehealth visit today.   He reports that he is doing well on tikosyn. Has not noted any afib. He is having back issues and may ultimately need back surgery. In the interim he may need a few more back injections. He had one in January but the relief was short lived. He was given ok to hold eliquis for 3 days prior. His MIL died 2 weeks ago in a nursing home, from afib with RVR, CHF, not covid, but they were not be able to be with her physically at the time.  Today, he denies symptoms of palpitations, chest pain, shortness of breath, orthopnea, PND, lower extremity edema, claudication, dizziness, presyncope, syncope, bleeding, or neurologic sequela. The patient is tolerating medications without difficulties and is otherwise without complaint today.   he denies symptoms of cough, fevers, chills, or new SOB worrisome for COVID 19.    Atrial Fibrillation Risk Factors:  he does not have symptoms or diagnosis of sleep apnea. he does not have a history of rheumatic fever. he does not have a history of alcohol use. The patient does not have a history of early familial atrial fibrillation or other arrhythmias.  he has a BMI of Body mass index is 36.52  kg/m.[name] Filed Weights   04/01/19 0932  Weight: 115.4 kg    Past Medical History:  Diagnosis Date  . Arthritis   . Atrial fibrillation ([place]) 07/2018  . Complication of anesthesia    diff. waking up  . Diabetes mellitus   . Dysrhythmia 07/2018   A-Fib  . Headache(784.0)   . Hypertension    dr Marisue Humble   pcp  . Sleep apnea    prior to weight lose surgery-Dr. Don Perking not use CPAP   Past Surgical History:  Procedure Laterality Date  . APPENDECTOMY    . BACK SURGERY     5  back surgeries  . CARDIAC CATHETERIZATION    . CARDIOVERSION N/A 08/13/2018   Procedure: CARDIOVERSION;  Surgeon: Larey Dresser, MD;  Location: [facility] ENDOSCOPY;  Service: Cardiovascular;  Laterality: N/A;  . CARDIOVERSION N/A 09/02/2018   Procedure: CARDIOVERSION;  Surgeon: Thayer Headings, MD;  Location: [place];  Service: Cardiovascular;  Laterality: N/A;  . CARPAL TUNNEL RELEASE     bil  . COLON SURGERY     2004 for colon rupture  . HERNIA REPAIR    . LAPAROSCOPIC GASTRIC BANDING    . LEFT HEART CATH AND CORONARY ANGIOGRAPHY N/A 07/21/2018   Procedure: LEFT HEART CATH AND CORONARY ANGIOGRAPHY;  Surgeon: Belva Crome, MD;  Location: [facility] CV LAB;  Service: Cardiovascular;  Laterality: N/A;  . MANDIBLE FRACTURE SURGERY     x 2  . PAROTIDECTOMY Right 11/19/2018   Procedure: PAROTIDECTOMY;  Surgeon:  Melida Quitter, MD;  Location: [facility];  Service: ENT;  Laterality: Right;  . TONSILLECTOMY       Current Outpatient Medications  Medication Sig Dispense Refill  . apixaban (ELIQUIS) 5 MG TABS tablet Take 1 tablet (5 mg total) by mouth 2 (two) times daily. 60 tablet 3  . atorvastatin (LIPITOR) 10 MG tablet Take 10 mg by mouth at bedtime.   1  . carvedilol (COREG) 25 MG tablet TAKE 1/2 TABLET (12.5MG ) BY MOUTH TWICE DAILY 60 tablet 3  . cyclobenzaprine (FLEXERIL) 10 MG tablet 1 tablet as needed.    . docusate sodium (COLACE) 100 MG capsule Take 100 mg by mouth 2 (two) times daily.     [name]  dofetilide (TIKOSYN) 500 MCG capsule TAKE 1 CAPSULE (500 MCG TOTAL) BY MOUTH 2 (TWO) TIMES DAILY. 60 capsule 6  . ENTRESTO 49-51 MG TAKE 1 TABLET BY MOUTH TWICE A DAY 60 tablet 4  . glimepiride (AMARYL) 2 MG tablet Take 2 mg by mouth daily with breakfast.    . HYDROcodone-acetaminophen (NORCO) 10-325 MG tablet Take 1 tablet by mouth every 4 (four) hours as needed (for severe back pain.).    [name] liraglutide (VICTOZA) 18 MG/3ML SOPN Inject 1.8 mg into the skin every evening.     . magnesium gluconate (MAGONATE) 500 MG tablet Take 500 mg by mouth 2 (two) times daily.    . metFORMIN (GLUCOPHAGE-XR) 500 MG 24 hr tablet Take 1,000 mg by mouth 2 (two) times daily.  1  . Multiple Vitamin (MULTIVITAMIN WITH MINERALS) TABS tablet Take 1 tablet by mouth daily.    [name] spironolactone (ALDACTONE) 25 MG tablet Take 0.5 tablets (12.5 mg total) by mouth daily. 45 tablet 3   No current facility-administered medications for this encounter.     Allergies:   Penicillins and Percocet [oxycodone-acetaminophen]   Social History:  The patient  reports that he quit smoking about 13 years ago. His smoking use included cigarettes. He has a 13.50 pack-year smoking history. He has never used smokeless tobacco. He reports that he does not drink alcohol or use drugs.   Family History:  The patient's  family history includes Diabetes in his maternal grandfather.    ROS:  Please see the history of present illness.   All other systems are personally reviewed and negative.   Exam: Well appearing, alert and conversant, regular work of breathing,  good skin color  Recent Labs: 11/15/2018: Hemoglobin 14.7; Platelets 189 12/08/2018: ALT 18 01/07/2019: BUN 17; Creatinine, Ser 0.94; Potassium 4.1; Sodium 139 01/19/2019: Magnesium 1.9  personally reviewed    Other studies personally reviewed: Additional studies/ records that were reviewed today include:  Apple watch tracing that he sent ran on daughter's watch that showed NSR.  Intervals appear  normal        ASSESSMENT AND PLAN:  1.Persisitent  atrial fibrillation He has done very well staying in SR since loading of Tikosyn He is beng compliant   No change in dose of 500 meq bid  Continue  carvedilol at 25 mg bid Continue eliquis 5 mg bid This patients CHA2DS2-VASc Score and unadjusted Ischemic Stroke Rate (% per year) is equal to 2.2 % stroke rate/year from a score of 2  Above score calculated as 1 point each if present [CHF, HTN, DM, Vascular=MI/PAD/Aortic Plaque, Age if 65-74, or Male] Above score calculated as 2 points each if present [Age > 75, or Stroke/TIA/TE]  2.Casey [name] Resolved with return of SR with normal systolic function by 0/9326  echo  COVID screen The patient does not have any symptoms that suggest any further testing/ screening at this time.  Social distancing reinforced today.   Follow-up:   Ekg/labs when able with covid restrictions, has F/u with Dr. Tamala Julian in July per recall  Current medicines are reviewed at length with the patient today.   The patient does not have concerns regarding his medicines.  The following changes were made today:  none  Labs/ tests ordered today include: none No orders of the defined types were placed in this encounter.   Patient Risk:  after full review of this patients clinical status, I feel that they are at moderate risk at this time.   Today, I have spent 15 minutes on the call  with the patient with telehealth technology and 5 mins prep work reviewing epic and apple watch strip    Signed, Roderic Palau NP 04/01/2019 10:27 AM  Afib [facility] [address] [place], [place] [address] [phone]   I hereby voluntarily request, consent and authorize the [facility] and its employed or contracted physicians, physician assistants, nurse practitioners or other licensed health care professionals (the Practitioner), to provide me with telemedicine health care  services (the "Services") as deemed necessary by the treating Practitioner. I acknowledge and consent to receive the Services by the Practitioner via telemedicine. I understand that the telemedicine visit will involve communicating with the Practitioner through live audiovisual communication technology and the disclosure of certain medical information by electronic transmission. I acknowledge that I have been given the opportunity to request an in-person assessment or other available alternative prior to the telemedicine visit and am voluntarily participating in the telemedicine visit.   I understand that I have the right to withhold or withdraw my consent to the use of telemedicine in the course of my care at any time, without affecting my right to future care or treatment, and that the Practitioner or I may terminate the telemedicine visit at any time. I understand that I have the right to inspect all information obtained and/or recorded in the course of the telemedicine visit and may receive copies of available information for a reasonable fee.  I understand that some of the potential risks of receiving the Services via telemedicine include:   Delay or interruption in medical evaluation due to technological equipment failure or disruption;  Information transmitted may not be sufficient (e.g. poor resolution of images) to allow for appropriate medical decision making by the Practitioner; and/or  In rare instances, security protocols could fail, causing a breach of personal health information.   Furthermore, I acknowledge that it is my responsibility to provide information about my medical history, conditions and care that is complete and accurate to the best of my ability. I acknowledge that Practitioner's advice, recommendations, and/or decision may be based on factors not within their control, such as incomplete or inaccurate data provided by me or distortions of diagnostic images or specimens that may  result from electronic transmissions. I understand that the practice of medicine is not an exact science and that Practitioner makes no warranties or guarantees regarding treatment outcomes. I acknowledge that I will receive a copy of this consent concurrently upon execution via email to the email address I last provided but may also request a printed copy by calling the office of the [facility].  I understand that my insurance will be billed for this visit.   I have read or had this consent read to  me.  I understand the contents of this consent, which adequately explains the benefits and risks of the Services being provided via telemedicine.  I have been provided ample opportunity to ask questions regarding this consent and the Services and have had my questions answered to my satisfaction.  I give my informed consent for the services to be provided through the use of telemedicine in my medical care  By participating in this telemedicine visit I agree to the above.

## 2019-04-10 ENCOUNTER — Other Ambulatory Visit (HOSPITAL_COMMUNITY): Payer: Self-pay | Admitting: Nurse Practitioner

## 2019-05-05 ENCOUNTER — Telehealth: Payer: Self-pay

## 2019-05-05 NOTE — Telephone Encounter (Signed)
LM for pt to call back to switch his appt with Dr Tamala Julian on 6/12 from [facility] to [place].Marland KitchenMarland KitchenAlso need to change the time to 11:00.

## 2019-05-09 NOTE — Telephone Encounter (Signed)
Left another msg for pt to call back regarding his appt with Dr Tamala Julian on 6/12.

## 2019-05-09 NOTE — Telephone Encounter (Signed)
Pt called back and appt was updated.  YOUR CARDIOLOGY TEAM HAS ARRANGED FOR AN E-VISIT FOR YOUR APPOINTMENT - PLEASE REVIEW IMPORTANT INFORMATION BELOW SEVERAL DAYS PRIOR TO YOUR APPOINTMENT  Due to the recent COVID-19 pandemic, we are transitioning in-person office visits to tele-medicine visits in an effort to decrease unnecessary exposure to our patients, their families, and staff. These visits are billed to your insurance just like a normal visit is. We also encourage you to sign up for MyChart if you have not already done so. You will need a smartphone if possible. For patients that do not have this, we can still complete the visit using a regular telephone but do prefer a smartphone to enable video when possible. You may have a family member that lives with you that can help. If possible, we also ask that you have a blood pressure cuff and scale at home to measure your blood pressure, heart rate and weight prior to your scheduled appointment. Patients with clinical needs that need an in-person evaluation and testing will still be able to come to the office if absolutely necessary. If you have any questions, feel free to call our office.     YOUR PROVIDER WILL BE USING THE FOLLOWING PLATFORM TO COMPLETE YOUR VISIT: Doximity  . IF USING MYCHART - How to Download the MyChart App to Your SmartPhone   - If Apple, go to [employer] and type in MyChart in the search bar and download the app. If Android, ask patient to go to [employer] and type in [place] in the search bar and download the app. The app is free but as with any other app downloads, your phone may require you to verify saved payment information or Apple/Android password.  - You will need to then log into the app with your MyChart username and password, and select [facility] as your healthcare provider to link the account.  - When it is time for your visit, go to the MyChart app, find appointments, and click Begin Video Visit. Be sure  to Select Allow for your device to access the Microphone and Camera for your visit. You will then be connected, and your provider will be with you shortly.  **If you have any issues connecting or need assistance, please contact MyChart service desk [phone])**  **If using a computer, in order to ensure the best quality for your visit, you will need to use either of the following Internet Browsers: Insurance underwriter or [employer]**  . IF USING DOXIMITY or DOXY.ME - The staff will give you instructions on receiving your link to join the meeting the day of your visit.      2-3 DAYS BEFORE YOUR APPOINTMENT  You will receive a telephone call from one of our [place] team members - your caller ID may say "Unknown caller." If this is a video visit, we will walk you through how to get the video launched on your phone. We will remind you check your blood pressure, heart rate and weight prior to your scheduled appointment. If you have an Apple Watch or Kardia, please upload any pertinent ECG strips the day before or morning of your appointment to [place]. Our staff will also make sure you have reviewed the consent and agree to move forward with your scheduled tele-health visit.     THE DAY OF YOUR APPOINTMENT  Approximately 15 minutes prior to your scheduled appointment, you will receive a telephone call from one of [facility] team -  your caller ID may say "Unknown caller."  Our staff will confirm medications, vital signs for the day and any symptoms you may be experiencing. Please have this information available prior to the time of visit start. It may also be helpful for you to have a pad of paper and pen handy for any instructions given during your visit. They will also walk you through joining the smartphone meeting if this is a video visit.    CONSENT FOR TELE-HEALTH VISIT - PLEASE REVIEW  I hereby voluntarily request, consent and authorize [facility] and its employed or  contracted physicians, physician assistants, nurse practitioners or other licensed health care professionals (the Practitioner), to provide me with telemedicine health care services (the "Services") as deemed necessary by the treating Practitioner. I acknowledge and consent to receive the Services by the Practitioner via telemedicine. I understand that the telemedicine visit will involve communicating with the Practitioner through live audiovisual communication technology and the disclosure of certain medical information by electronic transmission. I acknowledge that I have been given the opportunity to request an in-person assessment or other available alternative prior to the telemedicine visit and am voluntarily participating in the telemedicine visit.  I understand that I have the right to withhold or withdraw my consent to the use of telemedicine in the course of my care at any time, without affecting my right to future care or treatment, and that the Practitioner or I may terminate the telemedicine visit at any time. I understand that I have the right to inspect all information obtained and/or recorded in the course of the telemedicine visit and may receive copies of available information for a reasonable fee.  I understand that some of the potential risks of receiving the Services via telemedicine include:  [name] Delay or interruption in medical evaluation due to technological equipment failure or disruption; . Information transmitted may not be sufficient (e.g. poor resolution of images) to allow for appropriate medical decision making by the Practitioner; and/or  . In rare instances, security protocols could fail, causing a breach of personal health information.  Furthermore, I acknowledge that it is my responsibility to provide information about my medical history, conditions and care that is complete and accurate to the best of my ability. I acknowledge that Practitioner's advice, recommendations,  and/or decision may be based on factors not within their control, such as incomplete or inaccurate data provided by me or distortions of diagnostic images or specimens that may result from electronic transmissions. I understand that the practice of medicine is not an exact science and that Practitioner makes no warranties or guarantees regarding treatment outcomes. I acknowledge that I will receive a copy of this consent concurrently upon execution via email to the email address I last provided but may also request a printed copy by calling the office of [facility].    I understand that my insurance will be billed for this visit.   I have read or had this consent read to me. . I understand the contents of this consent, which adequately explains the benefits and risks of the Services being provided via telemedicine.  . I have been provided ample opportunity to ask questions regarding this consent and the Services and have had my questions answered to my satisfaction. . I give my informed consent for the services to be provided through the use of telemedicine in my medical care  By participating in this telemedicine visit I agree to the above.

## 2019-05-12 NOTE — Progress Notes (Signed)
Virtual Visit via Video Note   This visit type was conducted due to national recommendations for restrictions regarding the COVID-19 Pandemic (e.g. social distancing) in an effort to limit this patient's exposure and mitigate transmission in our community.  Due to his co-morbid illnesses, this patient is at least at moderate risk for complications without adequate follow up.  This format is felt to be most appropriate for this patient at this time.  All issues noted in this document were discussed and addressed.  A limited physical exam was performed with this format.  Please refer to the patient's chart for his consent to telehealth for [facility].   Date:  05/12/2019   ID:  Casey Garbe., DOB [birth year], MRN [id number]  Patient Location: Home Provider Location: Home  PCP:  Orpah Melter, MD  Cardiologist:  Sinclair Grooms, MD  Electrophysiologist:  None   Evaluation Performed:  Follow-Up Visit  Chief Complaint:  CAD  History of Present Illness:    Casey D Kelso Bibby. is a 63 y.o. male with  with a hx of atrial fibrillation and coincidental finding of systolic left ventricular dysfunction with EF less than 35%--> 65% after conversion and addition of Tikosyn and GDMT. Recent cardiac catheterization did not reveal significant CAD.  He feels well.  He has not had orthopnea, PND, decreased energy, or edema.  No episodes of syncope.  No neurological complaints.  No bleeding on current medical regimen.  Palpitations have not been an issue.  Tolerating medical therapy without complications.  The patient does not have symptoms concerning for COVID-19 infection (fever, chills, cough, or new shortness of breath).    Past Medical History:  Diagnosis Date  . Arthritis   . Atrial fibrillation ([place]) 07/2018  . Complication of anesthesia    diff. waking up  . Diabetes mellitus   . Dysrhythmia 07/2018   A-Fib  . Headache(784.0)   . Hypertension    dr Marisue Humble   pcp  . Sleep  apnea    prior to weight lose surgery-Dr. Don Perking not use CPAP   Past Surgical History:  Procedure Laterality Date  . APPENDECTOMY    . BACK SURGERY     5  back surgeries  . CARDIAC CATHETERIZATION    . CARDIOVERSION N/A 08/13/2018   Procedure: CARDIOVERSION;  Surgeon: Larey Dresser, MD;  [facility] ENDOSCOPY;  Service: Cardiovascular;  Laterality: N/A;  . CARDIOVERSION N/A 09/02/2018   Procedure: CARDIOVERSION;  Surgeon: Thayer Headings, MD;  Location: [place];  Service: Cardiovascular;  Laterality: N/A;  . CARPAL TUNNEL RELEASE     bil  . COLON SURGERY     2004 for colon rupture  . HERNIA REPAIR    . LAPAROSCOPIC GASTRIC BANDING    . LEFT HEART CATH AND CORONARY ANGIOGRAPHY N/A 07/21/2018   Procedure: LEFT HEART CATH AND CORONARY ANGIOGRAPHY;  Surgeon: Belva Crome, MD;  Location: [place] CV LAB;  Service: Cardiovascular;  Laterality: N/A;  . MANDIBLE FRACTURE SURGERY     x 2  . PAROTIDECTOMY Right 11/19/2018   Procedure: PAROTIDECTOMY;  Surgeon: Melida Quitter, MD;  Location: [place];  Service: ENT;  Laterality: Right;  . TONSILLECTOMY       No outpatient medications have been marked as taking for the 05/13/19 encounter (Appointment) with Belva Crome, MD.     Allergies:   Penicillins and Percocet [oxycodone-acetaminophen]   Social History   Tobacco Use  . Smoking status: Former Smoker  Packs/day: 0.90    Years: 15.00    Pack years: 13.50    Types: Cigarettes    Quit date: 01/19/2006    Years since quitting: 13.3  . Smokeless tobacco: Never Used  Substance Use Topics  . Alcohol use: No  . Drug use: No     Family Hx: The patient's family history includes Diabetes in his maternal grandfather. There is no history of Heart disease.  ROS:   Please see the history of present illness.    She is having cervical disc issues that require steroid injections. All other systems reviewed and are negative.   Prior CV studies:   The following  studies were reviewed today: ECHOCARDIOGRAM 12/2018: Study Conclusions  - Left ventricle: The cavity size was normal. Systolic function was   normal. Wall motion was normal; there were no regional wall   motion abnormalities. Doppler parameters are consistent with   abnormal left ventricular relaxation (grade 1 diastolic   dysfunction). Doppler parameters are consistent with   indeterminate ventricular filling pressure. - Aortic valve: Transvalvular velocity was within the normal range.   There was no stenosis. There was mild regurgitation. - Aorta: Ascending aortic diameter: 37 mm (S). - Ascending aorta: The ascending aorta was mildly dilated. - Mitral valve: Transvalvular velocity was within the normal range.   There was no evidence for stenosis. There was no regurgitation. - Right ventricle: The cavity size was normal. Wall thickness was   normal. Systolic function was normal. - Atrial septum: No defect or patent foramen ovale was identified. - Tricuspid valve: Transvalvular velocity was within the normal   range. There was no regurgitation. - Pulmonary arteries: Systolic pressure could not be accurately   estimated. - Pericardium, extracardiac: A trivial pericardial effusion was   identified. - Global longitudinal strain -15.1% (mildly abnormal).   Labs/Other Tests and Data Reviewed:    EKG:  No ECG reviewed.  Recent Labs: 11/15/2018: Hemoglobin 14.7; Platelets 189 12/08/2018: ALT 18 01/07/2019: BUN 17; Creatinine, Ser 0.94; Potassium 4.1; Sodium 139 01/19/2019: Magnesium 1.9   Recent Lipid Panel Lab Results  Component Value Date/Time   CHOL 129 12/08/2018 11:13 AM   TRIG 89 12/08/2018 11:13 AM   HDL 45 12/08/2018 11:13 AM   CHOLHDL 2.9 12/08/2018 11:13 AM   LDLCALC 66 12/08/2018 11:13 AM    Wt Readings from Last 3 Encounters:  04/01/19 254 lb 8 oz (115.4 kg)  01/07/19 258 lb 9.6 oz (117.3 kg)  12/08/18 258 lb (117 kg)     Objective:    Vital Signs:  There were  no vitals taken for this visit.   VITAL SIGNS:  reviewed GEN:  no acute distress RESPIRATORY:  normal respiratory effort, symmetric expansion CARDIOVASCULAR:  no peripheral edema NEURO:  alert and oriented x 3, no obvious focal deficit  ASSESSMENT & PLAN:    1. Coronary artery disease involving native coronary artery of native heart without angina pectoris   2. Chronic systolic heart failure (HCC)   3. Persistent atrial fibrillation   4. Hyperlipidemia with target LDL less than 70   5. Uncontrolled type 2 diabetes mellitus with hyperglycemia, without long-term current use of insulin ([place])   6. Obstructive sleep apnea   7. Essential hypertension   8. Educated About Covid-19 Virus Infection    PLAN:  1. No significant obstructive coronary disease and no chest pain to suggest angina. 2. Systolic heart failure has resolved to diastolic heart failure due to recovery of LV systolic function after  controlling heart rhythm.  Therefore systolic heart failure was at least partially the result of tachycardia. 3. A. fib has been resolved to normal sinus rhythm and currently on Tikosyn. 4. LDL target less than 70 5. A1c less than 7 as therapeutic target. 6. Compliance with CPAP is recommended Target blood pressure 130/80 mmHg or less. Overall education and awareness concerning primary/secondary risk prevention was discussed in detail: LDL less than 70, hemoglobin A1c less than 7, blood pressure target less than 130/80 mmHg, >150 minutes of moderate aerobic activity per week, avoidance of smoking, weight control (via diet and exercise), and continued surveillance/management of/for obstructive sleep apnea.    COVID-19 Education: The signs and symptoms of COVID-19 were discussed with the patient and how to seek care for testing (follow up with PCP or arrange E-visit).  The importance of social distancing was discussed today.  Time:   Today, I have spent 15 minutes with the patient with telehealth  technology discussing the above problems.     Medication Adjustments/Labs and Tests Ordered: Current medicines are reviewed at length with the patient today.  Concerns regarding medicines are outlined above.   Tests Ordered: No orders of the defined types were placed in this encounter.   Medication Changes: No orders of the defined types were placed in this encounter.   Follow Up:  Virtual Visit in 9 month(s)  Signed, Sinclair Grooms, MD  05/12/2019 9:33 PM    [place]

## 2019-05-13 ENCOUNTER — Telehealth (INDEPENDENT_AMBULATORY_CARE_PROVIDER_SITE_OTHER): Payer: PRIVATE HEALTH INSURANCE | Admitting: Interventional Cardiology

## 2019-05-13 ENCOUNTER — Encounter: Payer: Self-pay | Admitting: Interventional Cardiology

## 2019-05-13 ENCOUNTER — Other Ambulatory Visit: Payer: Self-pay

## 2019-05-13 VITALS — BP 132/79 | HR 60 | Ht 70.0 in | Wt 248.6 lb

## 2019-05-13 DIAGNOSIS — I5022 Chronic systolic (congestive) heart failure: Secondary | ICD-10-CM

## 2019-05-13 DIAGNOSIS — E785 Hyperlipidemia, unspecified: Secondary | ICD-10-CM

## 2019-05-13 DIAGNOSIS — G4733 Obstructive sleep apnea (adult) (pediatric): Secondary | ICD-10-CM

## 2019-05-13 DIAGNOSIS — I4819 Other persistent atrial fibrillation: Secondary | ICD-10-CM

## 2019-05-13 DIAGNOSIS — I10 Essential (primary) hypertension: Secondary | ICD-10-CM

## 2019-05-13 DIAGNOSIS — I2510 Atherosclerotic heart disease of native coronary artery without angina pectoris: Secondary | ICD-10-CM

## 2019-05-13 DIAGNOSIS — E1165 Type 2 diabetes mellitus with hyperglycemia: Secondary | ICD-10-CM

## 2019-05-13 DIAGNOSIS — Z7189 Other specified counseling: Secondary | ICD-10-CM

## 2019-05-13 NOTE — Patient Instructions (Signed)
Medication Instructions:  Your physician recommends that you continue on your current medications as directed. Please refer to the Current Medication list given to you today.  If you need a refill on your cardiac medications before your next appointment, please call your pharmacy.   Lab work: BMET and CBC when able to get to the office or can be done at Afib clinic if possible.  If you have labs (blood work) drawn today and your tests are completely normal, you will receive your results only by: [name] MyChart Message (if you have MyChart) OR . A paper copy in the mail If you have any lab test that is abnormal or we need to change your treatment, we will call you to review the results.  Testing/Procedures: None  Follow-Up: At [facility], you and your health needs are our priority.  As part of our continuing mission to provide you with exceptional heart care, we have created designated Provider Care Teams.  These Care Teams include your primary Cardiologist (physician) and Advanced Practice Providers (APPs -  Physician Assistants and Nurse Practitioners) who all work together to provide you with the care you need, when you need it. You will need a follow up appointment in 9-12 months.  Please call our office 2 months in advance to schedule this appointment.  You may see Sinclair Grooms, MD or one of the following Advanced Practice Providers on your designated Care Team:   Truitt Merle, NP Cecilie Kicks, NP . Kathyrn Drown, NP  Any Other Special Instructions Will Be Listed Below (If Applicable).

## 2019-05-19 ENCOUNTER — Encounter (HOSPITAL_COMMUNITY): Payer: PRIVATE HEALTH INSURANCE | Admitting: Nurse Practitioner

## 2019-05-23 ENCOUNTER — Encounter (HOSPITAL_COMMUNITY): Payer: Self-pay

## 2019-05-24 ENCOUNTER — Other Ambulatory Visit: Payer: Self-pay

## 2019-05-24 ENCOUNTER — Ambulatory Visit (HOSPITAL_COMMUNITY)
Admission: RE | Admit: 2019-05-24 | Discharge: 2019-05-24 | Disposition: A | Discharge status: Home / Self Care | Payer: PRIVATE HEALTH INSURANCE | Source: Ambulatory Visit | Attending: Nurse Practitioner | Admitting: Nurse Practitioner

## 2019-05-24 DIAGNOSIS — I4891 Unspecified atrial fibrillation: Secondary | ICD-10-CM | POA: Insufficient documentation

## 2019-05-24 LAB — BASIC METABOLIC PANEL
Anion gap: 9 (ref 5–15)
BUN: 22 mg/dL (ref 8–23)
CO2: 24 mmol/L (ref 22–32)
Calcium: 9.7 mg/dL (ref 8.9–10.3)
Chloride: 104 mmol/L (ref 98–111)
Creatinine, Ser: 1.04 mg/dL (ref 0.61–1.24)
GFR calc Af Amer: >60 mL/min (ref >60)
GFR calc non Af Amer: >60 mL/min (ref >60)
Glucose, Bld: 135 mg/dL — ABNORMAL HIGH (ref 70–99)
Potassium: 4.4 mmol/L (ref 3.5–5.1)
Sodium: 137 mmol/L (ref 135–145)

## 2019-05-24 LAB — CBC
HCT: 43.5 % (ref 39.0–52.0)
Hemoglobin: 14.3 g/dL (ref 13.0–17.0)
MCH: 30.4 pg (ref 26.0–34.0)
MCHC: 32.9 g/dL (ref 30.0–36.0)
MCV: 92.4 fL (ref 80.0–100.0)
Platelets: 221 10*3/uL (ref 150–400)
RBC: 4.71 MIL/uL (ref 4.22–5.81)
RDW: 13.6 % (ref 11.5–15.5)
WBC: 8.7 10*3/uL (ref 4.0–10.5)
nRBC: 0 % (ref 0.0–0.2)

## 2019-05-24 LAB — MAGNESIUM: Magnesium: 2 mg/dL (ref 1.7–2.4)

## 2019-05-24 NOTE — Progress Notes (Signed)
Pt in for EKG and labs for continued tikosyn use. He feels well. No problems with rhythm. EKG shows SR with pr int 172 ms, qrs int 94 ms, qtc 444 ms. F/u in 4 months.

## 2019-05-25 ENCOUNTER — Encounter (HOSPITAL_COMMUNITY): Payer: Self-pay

## 2019-06-07 DIAGNOSIS — R109 Unspecified abdominal pain: Secondary | ICD-10-CM | POA: Diagnosis not present

## 2019-06-07 DIAGNOSIS — N410 Acute prostatitis: Secondary | ICD-10-CM | POA: Diagnosis not present

## 2019-06-09 DIAGNOSIS — M47812 Spondylosis without myelopathy or radiculopathy, cervical region: Secondary | ICD-10-CM | POA: Diagnosis not present

## 2019-06-16 DIAGNOSIS — E119 Type 2 diabetes mellitus without complications: Secondary | ICD-10-CM | POA: Diagnosis not present

## 2019-06-16 DIAGNOSIS — H25813 Combined forms of age-related cataract, bilateral: Secondary | ICD-10-CM | POA: Diagnosis not present

## 2019-06-16 DIAGNOSIS — H5213 Myopia, bilateral: Secondary | ICD-10-CM | POA: Diagnosis not present

## 2019-06-16 DIAGNOSIS — H353131 Nonexudative age-related macular degeneration, bilateral, early dry stage: Secondary | ICD-10-CM | POA: Diagnosis not present

## 2019-06-20 DIAGNOSIS — M47812 Spondylosis without myelopathy or radiculopathy, cervical region: Secondary | ICD-10-CM | POA: Diagnosis not present

## 2019-06-21 DIAGNOSIS — N411 Chronic prostatitis: Secondary | ICD-10-CM | POA: Diagnosis not present

## 2019-07-13 DIAGNOSIS — E7800 Pure hypercholesterolemia, unspecified: Secondary | ICD-10-CM | POA: Diagnosis not present

## 2019-07-13 DIAGNOSIS — I10 Essential (primary) hypertension: Secondary | ICD-10-CM | POA: Diagnosis not present

## 2019-07-13 DIAGNOSIS — E669 Obesity, unspecified: Secondary | ICD-10-CM | POA: Diagnosis not present

## 2019-07-13 DIAGNOSIS — E1121 Type 2 diabetes mellitus with diabetic nephropathy: Secondary | ICD-10-CM | POA: Diagnosis not present

## 2019-07-13 DIAGNOSIS — Z125 Encounter for screening for malignant neoplasm of prostate: Secondary | ICD-10-CM | POA: Diagnosis not present

## 2019-07-18 DIAGNOSIS — M47812 Spondylosis without myelopathy or radiculopathy, cervical region: Secondary | ICD-10-CM | POA: Diagnosis not present

## 2019-07-18 DIAGNOSIS — I10 Essential (primary) hypertension: Secondary | ICD-10-CM | POA: Diagnosis not present

## 2019-07-18 DIAGNOSIS — Z6835 Body mass index (BMI) 35.0-35.9, adult: Secondary | ICD-10-CM | POA: Diagnosis not present

## 2019-07-18 DIAGNOSIS — R51 Headache: Secondary | ICD-10-CM | POA: Diagnosis not present

## 2019-08-07 ENCOUNTER — Other Ambulatory Visit (HOSPITAL_COMMUNITY): Payer: Self-pay | Admitting: Nurse Practitioner

## 2019-08-19 ENCOUNTER — Other Ambulatory Visit: Payer: Self-pay | Admitting: Surgery

## 2019-08-19 DIAGNOSIS — K432 Incisional hernia without obstruction or gangrene: Secondary | ICD-10-CM | POA: Diagnosis not present

## 2019-08-26 ENCOUNTER — Other Ambulatory Visit: Payer: Self-pay | Admitting: Interventional Cardiology

## 2019-08-30 DIAGNOSIS — M47812 Spondylosis without myelopathy or radiculopathy, cervical region: Secondary | ICD-10-CM | POA: Diagnosis not present

## 2019-09-06 DIAGNOSIS — M47812 Spondylosis without myelopathy or radiculopathy, cervical region: Secondary | ICD-10-CM | POA: Diagnosis not present

## 2019-09-07 ENCOUNTER — Other Ambulatory Visit: Payer: Self-pay | Admitting: Physician Assistant

## 2019-09-08 ENCOUNTER — Other Ambulatory Visit: Payer: Self-pay | Admitting: Interventional Cardiology

## 2019-09-09 ENCOUNTER — Other Ambulatory Visit: Payer: Self-pay | Admitting: Interventional Cardiology

## 2019-09-22 ENCOUNTER — Encounter (HOSPITAL_COMMUNITY): Payer: Self-pay | Admitting: Nurse Practitioner

## 2019-09-22 ENCOUNTER — Ambulatory Visit (HOSPITAL_COMMUNITY)
Admission: RE | Admit: 2019-09-22 | Discharge: 2019-09-22 | Disposition: A | Discharge status: Home / Self Care | Payer: PRIVATE HEALTH INSURANCE | Source: Ambulatory Visit | Attending: Nurse Practitioner | Admitting: Nurse Practitioner

## 2019-09-22 ENCOUNTER — Other Ambulatory Visit: Payer: Self-pay

## 2019-09-22 VITALS — BP 140/78 | HR 58 | Ht 70.0 in | Wt 242.8 lb

## 2019-09-22 DIAGNOSIS — Z9884 Bariatric surgery status: Secondary | ICD-10-CM | POA: Diagnosis not present

## 2019-09-22 DIAGNOSIS — M1990 Unspecified osteoarthritis, unspecified site: Secondary | ICD-10-CM | POA: Diagnosis not present

## 2019-09-22 DIAGNOSIS — Z87891 Personal history of nicotine dependence: Secondary | ICD-10-CM | POA: Insufficient documentation

## 2019-09-22 DIAGNOSIS — Z885 Allergy status to narcotic agent status: Secondary | ICD-10-CM | POA: Insufficient documentation

## 2019-09-22 DIAGNOSIS — G4733 Obstructive sleep apnea (adult) (pediatric): Secondary | ICD-10-CM | POA: Insufficient documentation

## 2019-09-22 DIAGNOSIS — Z7984 Long term (current) use of oral hypoglycemic drugs: Secondary | ICD-10-CM | POA: Diagnosis not present

## 2019-09-22 DIAGNOSIS — Z79899 Other long term (current) drug therapy: Secondary | ICD-10-CM | POA: Diagnosis not present

## 2019-09-22 DIAGNOSIS — I4819 Other persistent atrial fibrillation: Secondary | ICD-10-CM

## 2019-09-22 DIAGNOSIS — I2510 Atherosclerotic heart disease of native coronary artery without angina pectoris: Secondary | ICD-10-CM | POA: Diagnosis not present

## 2019-09-22 DIAGNOSIS — I10 Essential (primary) hypertension: Secondary | ICD-10-CM | POA: Insufficient documentation

## 2019-09-22 DIAGNOSIS — Z7901 Long term (current) use of anticoagulants: Secondary | ICD-10-CM | POA: Insufficient documentation

## 2019-09-22 DIAGNOSIS — Z880 Allergy status to penicillin: Secondary | ICD-10-CM | POA: Diagnosis not present

## 2019-09-22 DIAGNOSIS — E119 Type 2 diabetes mellitus without complications: Secondary | ICD-10-CM | POA: Insufficient documentation

## 2019-09-22 DIAGNOSIS — I4891 Unspecified atrial fibrillation: Secondary | ICD-10-CM | POA: Diagnosis present

## 2019-09-22 LAB — BASIC METABOLIC PANEL
Anion gap: 10 (ref 5–15)
BUN: 24 mg/dL — ABNORMAL HIGH (ref 8–23)
CO2: 22 mmol/L (ref 22–32)
Calcium: 9.1 mg/dL (ref 8.9–10.3)
Chloride: 104 mmol/L (ref 98–111)
Creatinine, Ser: 0.89 mg/dL (ref 0.61–1.24)
GFR calc Af Amer: >60 mL/min (ref >60)
GFR calc non Af Amer: >60 mL/min (ref >60)
Glucose, Bld: 148 mg/dL — ABNORMAL HIGH (ref 70–99)
Potassium: 4.7 mmol/L (ref 3.5–5.1)
Sodium: 136 mmol/L (ref 135–145)

## 2019-09-22 LAB — MAGNESIUM: Magnesium: 2.1 mg/dL (ref 1.7–2.4)

## 2019-09-22 NOTE — Progress Notes (Signed)
Primary Care Physician: Orpah Melter, MD Referring Physician: Dr. Fransico Him   Casey [name]. is a 63 y.o. male with a h/o obesity, s/p gastric sleeve, HTN, DM that presented for pre op for rt parotidectomy 8/8 and was found to be in rate controlled afib, from which he was asymptomatic. He was sent to the afib clinic for evaluation. 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 He has felt some fatigue, less stamina for several months. Was just seen by PCP yesterday and nothing out of the ordinary was noted with his heart rhythm. He denies any exertional chest pain.   He does not drink alcohol, no tobacco use,no excessive caffeine. He has kept his weight stable for several years. Was almost 100 lbs heavier before gastric sleeve.  I discussed with Dr. Rayann Heman and he recommended an echo and if ok, he would be considered at low risk for surgery and could start anticoagulation after surgery. However, pt is now back in the office as his echo did show a reduced EF at 30-35%. Dr. Rayann Heman discussed with Dr. Redmond Baseman and it is felt most appropriate  to delay surgery in order to obtain  LHC to further determine his risk for surgery. He does have cardiac risk factors for CAD , ie , obesity,  HTN, DM. He denies any exertional chest pain but c/o fatigue/ low stamina  for several months/early summer. LHC did show 3 vessel  moderate  CAD  F/u in afib clinic, he is now been on anticoagulation x 2 weeks without interruption and can now schedule cardioversion after another week, he is in agreement.  F/u in afib clinic,9/20, he unfortunately had unsuccessful cardioversion and is back in the clinic to discuss antiarrythmic's.It was decided that he would come into the hospital for Tikosyn after he checked on the price of the drug.  F/u in afib clinic, 10/1, for admission for Tikosyn.  He will get the drug free for the rest  of the year as he had met his out of pocket deductible.   F/u in afib clinic 10/11. He is staying in [place]. He feels improved. His reduced EF meds have been titrated by Dr. Tamala Julian.  F/u in afib clinic,01/07/19. He is in SR and has been doing well staying in rhythm on dofetilide. He ultimately had his parotid gland successfully removed and did not have any issues with the surgery, he was benign. He had a sleep study and it was positive for OSA. He is suppose to pick up his equipment soon.He has had an echo since restoring SR and has shown normalization of EF.  F/u in afib clinic 10/22, he is staying in [place] with tikosyn.[name] He has had neck surgeries and seeing some improvement in his pain.   Today, he denies symptoms of palpitations, chest pain, shortness of breath, orthopnea, PND, lower extremity edema, dizziness, presyncope, syncope, or neurologic sequela.  The patient is tolerating medications without difficulties and is otherwise without complaint today.   Past Medical History:  Diagnosis Date  . Arthritis   . Atrial fibrillation ([place]) 07/2018  . Complication of anesthesia    diff. waking up  . Diabetes mellitus   . Dysrhythmia 07/2018   A-Fib  . Headache(784.0)   . Hypertension    dr Marisue Humble   pcp  . Sleep apnea    prior to weight lose surgery-Dr. Redmond Baseman  aware-does not use CPAP   Past Surgical History:  Procedure Laterality Date  . APPENDECTOMY    . BACK SURGERY     5  back surgeries  . CARDIAC CATHETERIZATION    . CARDIOVERSION N/A 08/13/2018   Procedure: CARDIOVERSION;  Surgeon: Larey Dresser, MD;  Location: [facility] END[facility];  Service: Cardiovascular;  Laterality: N/A;  . CARDIOVERSION N/A 09/02/2018   Procedure: CARDIOVERSION;  Surgeon: Thayer Headings, MD;  Location: [place];  Service: Cardiovascular;  Laterality: N/A;  . CARPAL TUNNEL RELEASE     bil  . COLON SURGERY     2004 for colon rupture  . HERNIA REPAIR    . LAPAROSCOPIC GASTRIC BANDING    . LEFT HEART CATH  AND CORONARY ANGIOGRAPHY N/A 07/21/2018   Procedure: LEFT HEART CATH AND CORONARY ANGIOGRAPHY;  Surgeon: Belva Crome, MD;  Location: [place] CV LAB;  Service: Cardiovascular;  Laterality: N/A;  . MANDIBLE FRACTURE SURGERY     x 2  . PAROTIDECTOMY Right 11/19/2018   Procedure: PAROTIDECTOMY;  Surgeon: Melida Quitter, MD;  Location: [place];  Service: ENT;  Laterality: Right;  . TONSILLECTOMY      Current Outpatient Medications  Medication Sig Dispense Refill  . atorvastatin (LIPITOR) 10 MG tablet Take 10 mg by mouth at bedtime.   1  . B-D UF III MINI PEN NEEDLES 31G X 5 MM MISC USE WITH VICTOZA AS DIRECTED ONCE A DAY    . carvedilol (COREG) 25 MG tablet TAKE 1/2 TABLET (12.5MG) BY MOUTH TWICE DAILY 90 tablet 2  . dofetilide (TIKOSYN) 500 MCG capsule TAKE 1 CAPSULE (500 MCG TOTAL) BY MOUTH 2 (TWO) TIMES DAILY. 180 capsule 2  . ELIQUIS 5 MG TABS tablet TAKE 1 TABLET BY MOUTH TWICE A DAY 60 tablet 3  . ENTRESTO 49-51 MG TAKE 1 TABLET BY MOUTH TWICE A DAY 180 tablet 2  . glimepiride (AMARYL) 2 MG tablet Take 2 mg by mouth daily with breakfast.    . glucose blood test strip USE TO TEST BLOOD SUGAR THREE TIMES A DAY    . HYDROcodone-acetaminophen (NORCO) 10-325 MG tablet Take 1 tablet by mouth every 4 (four) hours as needed (for severe back pain.).    [name] liraglutide (VICTOZA) 18 MG/3ML SOPN Inject 1.8 mg into the skin every evening.     . magnesium gluconate (MAGONATE) 500 MG tablet Take 500 mg by mouth 2 (two) times daily.    . metFORMIN (GLUCOPHAGE-XR) 500 MG 24 hr tablet Take 1,000 mg by mouth 2 (two) times daily.  1  . Multiple Vitamin (MULTIVITAMIN WITH MINERALS) TABS tablet Take 1 tablet by mouth daily.    [name] spironolactone (ALDACTONE) 25 MG tablet TAKE 1/2 TABLET BY MOUTH EVERY DAY 45 tablet 2   No current facility-administered medications for this encounter.     Allergies  Allergen Reactions  . Penicillins Hives    Has patient had a PCN reaction causing immediate rash,  facial/tongue/throat swelling, SOB or lightheadedness with hypotension: Unknown Has patient had a PCN reaction causing severe rash involving mucus membranes or skin necrosis: Unknown Has patient had a PCN reaction that required hospitalization: No Has patient had a PCN reaction occurring within the last 10 years: No Childhood reaction. If all of the above answers are "NO", then may proceed with Cephalosporin use.   [name] Percocet [Oxycodone-Acetaminophen] Anxiety and Other (See Comments)    Hyperactivity & unhinged. Previously tolerated hydrocodone    Social History   Socioeconomic History  .  Marital status: Married    Spouse name: Not on file  . Number of children: Not on file  . Years of education: Not on file  . Highest education level: Not on file  Occupational History  . Not on file  Social Needs  . Financial resource strain: Not on file  . Food insecurity    Worry: Not on file    Inability: Not on file  . Transportation needs    Medical: Not on file    Non-medical: Not on file  Tobacco Use  . Smoking status: Former Smoker    Packs/day: 0.90    Years: 15.00    Pack years: 13.50    Types: Cigarettes    Quit date: 01/19/2006    Years since quitting: 13.6  . Smokeless tobacco: Never Used  Substance and Sexual Activity  . Alcohol use: No  . Drug use: No  . Sexual activity: Not on file  Lifestyle  . Physical activity    Days per week: Not on file    Minutes per session: Not on file  . Stress: Not on file  Relationships  . Social Herbalist on phone: Not on file    Gets together: Not on file    Attends religious service: Not on file    Active member of club or organization: Not on file    Attends meetings of clubs or organizations: Not on file    Relationship status: Not on file  . Intimate partner violence    Fear of current or ex partner: Not on file    Emotionally abused: Not on file    Physically abused: Not on file    Forced sexual activity: Not on  file  Other Topics Concern  . Not on file  Social History Narrative  . Not on file    Family History  Problem Relation Age of Onset  . Diabetes Maternal Grandfather   . Heart disease Neg Hx     ROS- All systems are reviewed and negative except as per the HPI above  Physical Exam: Vitals:   09/22/19 1003  BP: 140/78  Pulse: (!) 58  Weight: 110.1 kg  Height: 5' 10"  (1.778 m)   Wt Readings from Last 3 Encounters:  09/22/19 110.1 kg  05/13/19 112.8 kg  04/01/19 115.4 kg    Labs: Lab Results  Component Value Date   NA 137 05/24/2019   K 4.4 05/24/2019   CL 104 05/24/2019   CO2 24 05/24/2019   GLUCOSE 135 (H) 05/24/2019   BUN 22 05/24/2019   CREATININE 1.04 05/24/2019   CALCIUM 9.7 05/24/2019   MG 2.0 05/24/2019   Lab Results  Component Value Date   INR 1.11 11/19/2018   Lab Results  Component Value Date   CHOL 129 12/08/2018   HDL 45 12/08/2018   LDLCALC 66 12/08/2018   TRIG 89 12/08/2018     GEN- The patient is well appearing, alert and oriented x 3 today.   Head- normocephalic, atraumatic Eyes-  Sclera clear, conjunctiva pink Ears- hearing intact Oropharynx- clear Neck- supple, no JVP Lymph- no cervical lymphadenopathy Lungs- Clear to ausculation bilaterally, normal work of breathing Heart-regular rate and rhythm, no murmurs, rubs or gallops, PMI not laterally displaced GI- soft, NT, ND, + BS Extremities- no clubbing, cyanosis, or edema MS- no significant deformity or atrophy Skin- no rash or lesion Psych- euthymic mood, full affect Neuro- strength and sensation are intact  EKG- NSR at 58 bpm,  pr int 170 ms, qrs int 94 ms, qtc 437 ms Epic records reviewed Echo-Study Conclusions  - Left ventricle: The cavity size was mildly dilated. Wall   thickness was increased in a pattern of mild LVH. Systolic   function was moderately to severely reduced. The estimated   ejection fraction was in the range of 30% to 35%. Diffuse   hypokinesis. - Aortic  valve: There was trivial regurgitation. - Mitral valve: There was mild regurgitation.  Impressions: Echo- 11/2019-Study Conclusions  - Left ventricle: The cavity size was normal. Systolic function was   normal. Wall motion was normal; there were no regional wall   motion abnormalities. Doppler parameters are consistent with   abnormal left ventricular relaxation (grade 1 diastolic   dysfunction). Doppler parameters are consistent with   indeterminate ventricular filling pressure. - Aortic valve: Transvalvular velocity was within the normal range.   There was no stenosis. There was mild regurgitation. - Aorta: Ascending aortic diameter: 37 mm (S). - Ascending aorta: The ascending aorta was mildly dilated. - Mitral valve: Transvalvular velocity was within the normal range.   There was no evidence for stenosis. There was no regurgitation. - Right ventricle: The cavity size was normal. Wall thickness was   normal. Systolic function was normal. - Atrial septum: No defect or patent foramen ovale was identified. - Tricuspid valve: Transvalvular velocity was within the normal   range. There was no regurgitation. - Pulmonary arteries: Systolic pressure could not be accurately   estimated. - Pericardium, extracardiac: A trivial pericardial effusion was   identified. - Global longitudinal strain -15.1% (mildly abnormal).   LHC- 8/21-Moderate three-vessel coronary artery disease.  60 to 70% mid RCA and 95% stenosis of the distal right coronary beyond the origin of the PDA.  Normal left main  Mid LAD 60 to 70% stenosis beyond the origin of the dominant first diagonal.  Large ramus intermedius with luminal irregularity.  Relatively small distribution circumflex with 70% obstruction in the first marginal and total occlusion of the distal circumflex before the origin of the small second obtuse marginal.  There are faint left to left collaterals to the distal circumflex.  Moderately  severe left ventricular dysfunction with global hypokinesis, EF 35%.  Normal LVEDP.    RECOMMENDATIONS:   In absence of significant/limiting anginal symptoms, would recommend anti-ischemic therapy with beta-blockade and long-acting nitrates.  If symptoms develop or become refractory PCI of the very distal RCA, and LAD would be possible.  Lesions were not angiographically critical, and therefore based on data from the Courage trial, medical therapy would seem to be adequate.  Recommend guideline directed therapy for systolic heart failure: Transition to Entresto from ARB, transition Tenormin to carvedilol, add mineralocorticoid receptor antagonist (Aldactone or eplerenone).  With reference to heart failure, diabetes management should include an SGLT 2 agent to decrease incidence of heart failure symptoms.  Finally it may be helpful to have restoration of sinus rhythm.  LV dysfunction is out of proportion to the degree of coronary disease.   Recommend Aspirin 35m daily for moderate CAD.  Assessment and Plan: 1. Persistent  Afib Now staying in rhythm with dofetilide Continue drug at 500 mcg bid Qtc stable Bmet/mag today Sleep study positive for OSA, on bipap  2.[id number] score of 2(htn, DM)  Continue eliquis 5 mg bid   3. CAD / LV dysfunction  EF has normalized with restoring SR  F/u here in 4 months for tikosyn surveillance   DButch PennyC. Carroll, [place]  [facility] [address] [place], [place] [address] [phone]

## 2019-09-29 ENCOUNTER — Encounter (HOSPITAL_COMMUNITY): Payer: Self-pay

## 2019-10-18 DIAGNOSIS — Z6835 Body mass index (BMI) 35.0-35.9, adult: Secondary | ICD-10-CM | POA: Diagnosis not present

## 2019-10-18 DIAGNOSIS — I10 Essential (primary) hypertension: Secondary | ICD-10-CM | POA: Diagnosis not present

## 2019-10-18 DIAGNOSIS — M47812 Spondylosis without myelopathy or radiculopathy, cervical region: Secondary | ICD-10-CM | POA: Diagnosis not present

## 2019-10-18 DIAGNOSIS — R519 Headache, unspecified: Secondary | ICD-10-CM | POA: Diagnosis not present

## 2019-12-14 ENCOUNTER — Other Ambulatory Visit (HOSPITAL_COMMUNITY): Payer: Self-pay | Admitting: Nurse Practitioner

## 2019-12-23 ENCOUNTER — Telehealth: Payer: Self-pay

## 2019-12-23 NOTE — Telephone Encounter (Signed)
I spoke with the patient and told him that we recommend the vaccine to all of our patients.  He verbalized understanding.

## 2019-12-27 ENCOUNTER — Encounter (HOSPITAL_COMMUNITY): Payer: Self-pay

## 2020-01-02 DIAGNOSIS — R519 Headache, unspecified: Secondary | ICD-10-CM | POA: Diagnosis not present

## 2020-01-02 DIAGNOSIS — M5412 Radiculopathy, cervical region: Secondary | ICD-10-CM | POA: Diagnosis not present

## 2020-01-02 DIAGNOSIS — M47812 Spondylosis without myelopathy or radiculopathy, cervical region: Secondary | ICD-10-CM | POA: Diagnosis not present

## 2020-01-03 ENCOUNTER — Encounter (HOSPITAL_COMMUNITY): Payer: Self-pay

## 2020-01-13 DIAGNOSIS — E291 Testicular hypofunction: Secondary | ICD-10-CM | POA: Diagnosis not present

## 2020-01-13 DIAGNOSIS — E1121 Type 2 diabetes mellitus with diabetic nephropathy: Secondary | ICD-10-CM | POA: Diagnosis not present

## 2020-01-13 DIAGNOSIS — Z7984 Long term (current) use of oral hypoglycemic drugs: Secondary | ICD-10-CM | POA: Diagnosis not present

## 2020-01-13 DIAGNOSIS — E7800 Pure hypercholesterolemia, unspecified: Secondary | ICD-10-CM | POA: Diagnosis not present

## 2020-01-13 DIAGNOSIS — I10 Essential (primary) hypertension: Secondary | ICD-10-CM | POA: Diagnosis not present

## 2020-01-24 ENCOUNTER — Ambulatory Visit (HOSPITAL_COMMUNITY): Payer: Medicare Other | Admitting: Nurse Practitioner

## 2020-01-26 ENCOUNTER — Ambulatory Visit (HOSPITAL_COMMUNITY): Payer: Medicare Other | Admitting: Nurse Practitioner

## 2020-01-26 DIAGNOSIS — M5412 Radiculopathy, cervical region: Secondary | ICD-10-CM | POA: Diagnosis not present

## 2020-02-01 ENCOUNTER — Encounter (HOSPITAL_COMMUNITY): Payer: Self-pay | Admitting: Nurse Practitioner

## 2020-02-01 ENCOUNTER — Ambulatory Visit (HOSPITAL_COMMUNITY)
Admission: RE | Admit: 2020-02-01 | Discharge: 2020-02-01 | Disposition: A | Discharge status: Home / Self Care | Payer: PRIVATE HEALTH INSURANCE | Source: Ambulatory Visit | Attending: Nurse Practitioner | Admitting: Nurse Practitioner

## 2020-02-01 ENCOUNTER — Other Ambulatory Visit: Payer: Self-pay

## 2020-02-01 VITALS — BP 124/72 | HR 67 | Ht 70.0 in | Wt 238.8 lb

## 2020-02-01 DIAGNOSIS — Z885 Allergy status to narcotic agent status: Secondary | ICD-10-CM | POA: Insufficient documentation

## 2020-02-01 DIAGNOSIS — E119 Type 2 diabetes mellitus without complications: Secondary | ICD-10-CM | POA: Diagnosis not present

## 2020-02-01 DIAGNOSIS — E669 Obesity, unspecified: Secondary | ICD-10-CM | POA: Diagnosis not present

## 2020-02-01 DIAGNOSIS — Z880 Allergy status to penicillin: Secondary | ICD-10-CM | POA: Insufficient documentation

## 2020-02-01 DIAGNOSIS — Z7901 Long term (current) use of anticoagulants: Secondary | ICD-10-CM | POA: Diagnosis not present

## 2020-02-01 DIAGNOSIS — I10 Essential (primary) hypertension: Secondary | ICD-10-CM | POA: Diagnosis not present

## 2020-02-01 DIAGNOSIS — Z79899 Other long term (current) drug therapy: Secondary | ICD-10-CM | POA: Diagnosis not present

## 2020-02-01 DIAGNOSIS — Z87891 Personal history of nicotine dependence: Secondary | ICD-10-CM | POA: Insufficient documentation

## 2020-02-01 DIAGNOSIS — I4819 Other persistent atrial fibrillation: Secondary | ICD-10-CM

## 2020-02-01 DIAGNOSIS — Z6834 Body mass index (BMI) 34.0-34.9, adult: Secondary | ICD-10-CM | POA: Insufficient documentation

## 2020-02-01 DIAGNOSIS — G4733 Obstructive sleep apnea (adult) (pediatric): Secondary | ICD-10-CM | POA: Insufficient documentation

## 2020-02-01 DIAGNOSIS — I2510 Atherosclerotic heart disease of native coronary artery without angina pectoris: Secondary | ICD-10-CM | POA: Insufficient documentation

## 2020-02-01 DIAGNOSIS — Z9884 Bariatric surgery status: Secondary | ICD-10-CM | POA: Diagnosis not present

## 2020-02-01 DIAGNOSIS — D6869 Other thrombophilia: Secondary | ICD-10-CM | POA: Diagnosis not present

## 2020-02-01 DIAGNOSIS — Z7984 Long term (current) use of oral hypoglycemic drugs: Secondary | ICD-10-CM | POA: Insufficient documentation

## 2020-02-01 DIAGNOSIS — I4891 Unspecified atrial fibrillation: Secondary | ICD-10-CM | POA: Diagnosis present

## 2020-02-01 LAB — BASIC METABOLIC PANEL
Anion gap: 12 (ref 5–15)
BUN: 14 mg/dL (ref 8–23)
CO2: 24 mmol/L (ref 22–32)
Calcium: 9.2 mg/dL (ref 8.9–10.3)
Chloride: 101 mmol/L (ref 98–111)
Creatinine, Ser: 0.92 mg/dL (ref 0.61–1.24)
GFR calc Af Amer: >60 mL/min (ref >60)
GFR calc non Af Amer: >60 mL/min (ref >60)
Glucose, Bld: 221 mg/dL — ABNORMAL HIGH (ref 70–99)
Potassium: 4.3 mmol/L (ref 3.5–5.1)
Sodium: 137 mmol/L (ref 135–145)

## 2020-02-01 LAB — MAGNESIUM: Magnesium: 1.9 mg/dL (ref 1.7–2.4)

## 2020-02-01 NOTE — Progress Notes (Signed)
Primary Care Physician: Orpah Melter, MD Referring Physician: Dr. Fransico Him   Siddhartha D Viraaj Vorndran. is a 64 y.o. male with a h/o obesity, s/p gastric sleeve, HTN, DM that presented for pre op for rt parotidectomy 8/8 and was found to be in rate controlled afib, from which he was asymptomatic. He was sent to the afib clinic for evaluation. 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 He has felt some fatigue, less stamina for several months. Was just seen by PCP yesterday and nothing out of the ordinary was noted with his heart rhythm. He denies any exertional chest pain.   He does not drink alcohol, no tobacco use,no excessive caffeine. He has kept his weight stable for several years. Was almost 100 lbs heavier before gastric sleeve.  I discussed with Dr. Rayann Heman and he recommended an echo and if ok, he would be considered at low risk for surgery and could start anticoagulation after surgery. However, pt is now back in the office as his echo did show a reduced EF at 30-35%. Dr. Rayann Heman discussed with Dr. Redmond Baseman and it is felt most appropriate  to delay surgery in order to obtain  LHC to further determine his risk for surgery. He does have cardiac risk factors for CAD , ie , obesity,  HTN, DM. He denies any exertional chest pain but c/o fatigue/ low stamina  for several months/early summer. LHC did show 3 vessel  moderate  CAD  F/u in afib clinic, he is now been on anticoagulation x 2 weeks without interruption and can now schedule cardioversion after another week, he is in agreement.  F/u in afib clinic,9/20, he unfortunately had unsuccessful cardioversion and is back in the clinic to discuss antiarrythmic's.It was decided that he would come into the hospital for Tikosyn after he checked on the price of the drug.  F/u in afib clinic, 10/1, for admission for Tikosyn.  He will get the drug free for the rest  of the year as he had met his out of pocket deductible.   F/u in afib clinic 10/11. He is staying in [place] on [place]. He feels improved. His reduced EF meds have been titrated by Dr. Tamala Julian.  F/u in afib clinic,01/07/19. He is in SR and has been doing well staying in rhythm on dofetilide. He ultimately had his parotid gland successfully removed and did not have any issues with the surgery, he was benign. He had a sleep study and it was positive for OSA. He is suppose to pick up his equipment soon.He has had an echo since restoring SR and has shown normalization of EF.  F/u in afib clinic 10/22, he is staying in [place] with tikosyn. He has had neck surgeries and seeing some improvement in his pain.   F/u in afib clinic, 02/01/20. He continues to stay in [place] with Tikosyn. Continues on eliquis with a CHA2DS2VASc of 2. Had a back injection last week and came off eliquis x 3 days. He is anticipating  a surgery for a rod to be placed in his cervical spine in the near future. Has had both covid shots.  Today, he denies symptoms of palpitations, chest pain, shortness of breath, orthopnea, PND, lower extremity edema, dizziness, presyncope, syncope, or neurologic sequela.  The patient is tolerating medications without difficulties and is otherwise without complaint today.   Past Medical History:  Diagnosis Date  .  Arthritis   . Atrial fibrillation ([place]) 07/2018  . Complication of anesthesia    diff. waking up  . Diabetes mellitus   . Dysrhythmia 07/2018   A-Fib  . Headache(784.0)   . Hypertension    dr Marisue Humble   pcp  . Sleep apnea    prior to weight lose surgery-Dr. Don Perking not use CPAP   Past Surgical History:  Procedure Laterality Date  . APPENDECTOMY    . BACK SURGERY     5  back surgeries  . CARDIAC CATHETERIZATION    . CARDIOVERSION N/A 08/13/2018   Procedure: CARDIOVERSION;  Surgeon: Larey Dresser, MD;  Location: [facility] ENDOSCOPY;  Service: Cardiovascular;  Laterality: N/A;  . CARDIOVERSION  N/A 09/02/2018   Procedure: CARDIOVERSION;  Surgeon: Thayer Headings, MD;  Location: [facility];  Service: Cardiovascular;  Laterality: N/A;  . CARPAL TUNNEL RELEASE     bil  . COLON SURGERY     2004 for colon rupture  . HERNIA REPAIR    . LAPAROSCOPIC GASTRIC BANDING    . LEFT HEART CATH AND CORONARY ANGIOGRAPHY N/A 07/21/2018   Procedure: LEFT HEART CATH AND CORONARY ANGIOGRAPHY;  Surgeon: Belva Crome, MD;  Location: [facility];  Service: Cardiovascular;  Laterality: N/A;  . MANDIBLE FRACTURE SURGERY     x 2  . PAROTIDECTOMY Right 11/19/2018   Procedure: PAROTIDECTOMY;  Surgeon: Melida Quitter, MD;  Location: [facility];  Service: ENT;  Laterality: Right;  . TONSILLECTOMY      Current Outpatient Medications  Medication Sig Dispense Refill  . atorvastatin (LIPITOR) 10 MG tablet Take 10 mg by mouth at bedtime.   1  . B-D UF III MINI PEN NEEDLES 31G X 5 MM MISC USE WITH VICTOZA AS DIRECTED ONCE A DAY    . carvedilol (COREG) 25 MG tablet TAKE 1/2 TABLET (12.5MG) BY MOUTH TWICE DAILY 90 tablet 2  . dofetilide (TIKOSYN) 500 MCG capsule TAKE 1 CAPSULE (500 MCG TOTAL) BY MOUTH 2 (TWO) TIMES DAILY. 180 capsule 2  . ELIQUIS 5 MG TABS tablet TAKE 1 TABLET BY MOUTH TWICE A DAY 60 tablet 6  . ENTRESTO 49-51 MG TAKE 1 TABLET BY MOUTH TWICE A DAY 180 tablet 2  . glimepiride (AMARYL) 2 MG tablet Take 2 mg by mouth daily with breakfast.    . glucose blood test strip USE TO TEST BLOOD SUGAR THREE TIMES A DAY    . HYDROcodone-acetaminophen (NORCO) 10-325 MG tablet Take 1 tablet by mouth every 4 (four) hours as needed (for severe back pain.).    [name] liraglutide (VICTOZA) 18 MG/3ML SOPN Inject 1.8 mg into the skin every evening.     . magnesium gluconate (MAGONATE) 500 MG tablet Take 500 mg by mouth 2 (two) times daily.    . metFORMIN (GLUCOPHAGE-XR) 500 MG 24 hr tablet Take 1,000 mg by mouth 2 (two) times daily.  1  . Multiple Vitamin (MULTIVITAMIN WITH MINERALS) TABS tablet Take 1 tablet by mouth  daily.    [name] spironolactone (ALDACTONE) 25 MG tablet TAKE 1/2 TABLET BY MOUTH EVERY DAY 45 tablet 2  . topiramate (TOPAMAX) 25 MG tablet PLEASE SEE ATTACHED FOR DETAILED DIRECTIONS     No current facility-administered medications for this encounter.    Allergies  Allergen Reactions  . Penicillins Hives    Has patient had a PCN reaction causing immediate rash, facial/tongue/throat swelling, SOB or lightheadedness with hypotension: Unknown Has patient had a PCN reaction causing severe rash involving mucus membranes  or skin necrosis: Unknown Has patient had a PCN reaction that required hospitalization: No Has patient had a PCN reaction occurring within the last 10 years: No Childhood reaction. If all of the above answers are "NO", then may proceed with Cephalosporin use.   [name] Percocet [Oxycodone-Acetaminophen] Anxiety and Other (See Comments)    Hyperactivity & unhinged. Previously tolerated hydrocodone    Social History   Socioeconomic History  . Marital status: Married    Spouse name: Not on file  . Number of children: Not on file  . Years of education: Not on file  . Highest education level: Not on file  Occupational History  . Not on file  Tobacco Use  . Smoking status: Former Smoker    Packs/day: 0.90    Years: 15.00    Pack years: 13.50    Types: Cigarettes    Quit date: 01/19/2006    Years since quitting: 14.0  . Smokeless tobacco: Never Used  Substance and Sexual Activity  . Alcohol use: No  . Drug use: No  . Sexual activity: Not on file  Other Topics Concern  . Not on file  Social History Narrative  . Not on file   Social Determinants of Health   Financial Resource Strain:   . Difficulty of Paying Living Expenses: Not on file  Food Insecurity:   . Worried About Charity fundraiser in the Last Year: Not on file  . Ran Out of Food in the Last Year: Not on file  Transportation Needs:   . Lack of Transportation (Medical): Not on file  . Lack of Transportation  (Non-Medical): Not on file  Physical Activity:   . Days of Exercise per Week: Not on file  . Minutes of Exercise per Session: Not on file  Stress:   . Feeling of Stress : Not on file  Social Connections:   . Frequency of Communication with Friends and Family: Not on file  . Frequency of Social Gatherings with Friends and Family: Not on file  . Attends Religious Services: Not on file  . Active Member of Clubs or Organizations: Not on file  . Attends Archivist Meetings: Not on file  . Marital Status: Not on file  Intimate Partner Violence:   . Fear of Current or Ex-Partner: Not on file  . Emotionally Abused: Not on file  . Physically Abused: Not on file  . Sexually Abused: Not on file    Family History  Problem Relation Age of Onset  . Diabetes Maternal Grandfather   . Heart disease Neg Hx     ROS- All systems are reviewed and negative except as per the HPI above  Physical Exam: There were no vitals filed for this visit. Wt Readings from Last 3 Encounters:  09/22/19 110.1 kg  05/13/19 112.8 kg  04/01/19 115.4 kg    Labs: Lab Results  Component Value Date   NA 136 09/22/2019   K 4.7 09/22/2019   CL 104 09/22/2019   CO2 22 09/22/2019   GLUCOSE 148 (H) 09/22/2019   BUN 24 (H) 09/22/2019   CREATININE 0.89 09/22/2019   CALCIUM 9.1 09/22/2019   MG 2.1 09/22/2019   Lab Results  Component Value Date   INR 1.11 11/19/2018   Lab Results  Component Value Date   CHOL 129 12/08/2018   HDL 45 12/08/2018   LDLCALC 66 12/08/2018   TRIG 89 12/08/2018     GEN- The patient is well appearing, alert and oriented x  3 today.   Head- normocephalic, atraumatic Eyes-  Sclera clear, conjunctiva pink Ears- hearing intact Oropharynx- clear Neck- supple, no JVP Lymph- no cervical lymphadenopathy Lungs- Clear to ausculation bilaterally, normal work of breathing Heart-regular rate and rhythm, no murmurs, rubs or gallops, PMI not laterally displaced GI- soft, NT, ND, +  BS Extremities- no clubbing, cyanosis, or edema MS- no significant deformity or atrophy Skin- no rash or lesion Psych- euthymic mood, full affect Neuro- strength and sensation are intact  EKG- NSR at 67 bpm, pr int 168 ms, qrs int 96  ms, qtc 405 ms Epic records reviewed Echo-Study Conclusions  - Left ventricle: The cavity size was mildly dilated. Wall   thickness was increased in a pattern of mild LVH. Systolic   function was moderately to severely reduced. The estimated   ejection fraction was in the range of 30% to 35%. Diffuse   hypokinesis. - Aortic valve: There was trivial regurgitation. - Mitral valve: There was mild regurgitation.  Impressions: Echo- 11/2019-Study Conclusions  - Left ventricle: The cavity size was normal. Systolic function was   normal. Wall motion was normal; there were no regional wall   motion abnormalities. Doppler parameters are consistent with   abnormal left ventricular relaxation (grade 1 diastolic   dysfunction). Doppler parameters are consistent with   indeterminate ventricular filling pressure. - Aortic valve: Transvalvular velocity was within the normal range.   There was no stenosis. There was mild regurgitation. - Aorta: Ascending aortic diameter: 37 mm (S). - Ascending aorta: The ascending aorta was mildly dilated. - Mitral valve: Transvalvular velocity was within the normal range.   There was no evidence for stenosis. There was no regurgitation. - Right ventricle: The cavity size was normal. Wall thickness was   normal. Systolic function was normal. - Atrial septum: No defect or patent foramen ovale was identified. - Tricuspid valve: Transvalvular velocity was within the normal   range. There was no regurgitation. - Pulmonary arteries: Systolic pressure could not be accurately   estimated. - Pericardium, extracardiac: A trivial pericardial effusion was   identified. - Global longitudinal strain -15.1% (mildly  abnormal).   LHC- 8/21-Moderate three-vessel coronary artery disease.  60 to 70% mid RCA and 95% stenosis of the distal right coronary beyond the origin of the PDA.  Normal left main  Mid LAD 60 to 70% stenosis beyond the origin of the dominant first diagonal.  Large ramus intermedius with luminal irregularity.  Relatively small distribution circumflex with 70% obstruction in the first marginal and total occlusion of the distal circumflex before the origin of the small second obtuse marginal.  There are faint left to left collaterals to the distal circumflex.  Moderately severe left ventricular dysfunction with global hypokinesis, EF 35%.  Normal LVEDP.    RECOMMENDATIONS:   In absence of significant/limiting anginal symptoms, would recommend anti-ischemic therapy with beta-blockade and long-acting nitrates.  If symptoms develop or become refractory PCI of the very distal RCA, and LAD would be possible.  Lesions were not angiographically critical, and therefore based on data from the Courage trial, medical therapy would seem to be adequate.  Recommend guideline directed therapy for systolic heart failure: Transition to Entresto from ARB, transition Tenormin to carvedilol, add mineralocorticoid receptor antagonist (Aldactone or eplerenone).  With reference to heart failure, diabetes management should include an SGLT 2 agent to decrease incidence of heart failure symptoms.  Finally it may be helpful to have restoration of sinus rhythm.  LV dysfunction is out of proportion to  the degree of coronary disease.   Recommend Aspirin 69m daily for moderate CAD.  Assessment and Plan: 1. Persistent  Afib Now staying in rhythm with dofetilide Continue drug at 500 mcg bid Qtc stable Bmet/mag today Continue  OSA, on bipap  2.CHA2DS2VASc score of 2(htn, DM)  Continue eliquis 5 mg bid   3. CAD / LV dysfunction  EF has normalized with restoring SR  F/u here in 6 months for tikosyn  surveillance   F/u with Dr. STamala Julian3/26 as scheduled  DGeroge Baseman Cara Aguino, AV[facility] [id number]

## 2020-02-07 ENCOUNTER — Telehealth: Payer: Self-pay | Admitting: *Deleted

## 2020-02-07 DIAGNOSIS — Z9884 Bariatric surgery status: Secondary | ICD-10-CM | POA: Diagnosis not present

## 2020-02-07 DIAGNOSIS — Z9049 Acquired absence of other specified parts of digestive tract: Secondary | ICD-10-CM | POA: Diagnosis not present

## 2020-02-07 DIAGNOSIS — I4891 Unspecified atrial fibrillation: Secondary | ICD-10-CM | POA: Diagnosis not present

## 2020-02-07 DIAGNOSIS — Z1211 Encounter for screening for malignant neoplasm of colon: Secondary | ICD-10-CM | POA: Diagnosis not present

## 2020-02-07 DIAGNOSIS — Z8679 Personal history of other diseases of the circulatory system: Secondary | ICD-10-CM | POA: Diagnosis not present

## 2020-02-07 DIAGNOSIS — Z8719 Personal history of other diseases of the digestive system: Secondary | ICD-10-CM | POA: Diagnosis not present

## 2020-02-07 DIAGNOSIS — K219 Gastro-esophageal reflux disease without esophagitis: Secondary | ICD-10-CM | POA: Diagnosis not present

## 2020-02-07 NOTE — Telephone Encounter (Signed)
Patient with diagnosis of atrial fibrillation on Eliquis for anticoagulation.    Procedure: colonoscopy/endoscopy Date of procedure: TBD sometime in May  CHADS2-VASc score of 4 (CHF, HTN, DM, CAD). Patient was cardioverted on Tikosyn in October 2020.   CrCl 102 Platelet count 221  Per office protocol, okay for patient to hold Eliquis for 2 days prior to procedure.

## 2020-02-07 NOTE — Telephone Encounter (Signed)
   Primary Cardiologist: Sinclair Grooms, MD  Chart reviewed as part of pre-operative protocol coverage. Given past medical history and time since last visit, based on ACC/AHA guidelines, Casey [name]. would be at acceptable risk for the planned procedure without further cardiovascular testing.   His RCRI is a class II risk, 0.9% risk of major cardiac event.  Patient with diagnosis of atrial fibrillation on Eliquis for anticoagulation.   Procedure: colonoscopy/endoscopy   Date of procedure: TBD sometime in May   CHADS2-VASc score of 4 (CHF, HTN, DM, CAD). Patient was cardioverted on Tikosyn in October 2020.   CrCl 102   Platelet count 221   Per office protocol, okay for patient to hold Eliquis for 2 days prior to procedure.   I will route this recommendation to the requesting party via Epic fax function and remove from pre-op pool.  Please call with questions.  Casey [name]. [facility] [place] [address] Office [phone] Fax [phone]

## 2020-02-07 NOTE — Telephone Encounter (Signed)
   [facility] Pre-operative Risk Assessment    Request for surgical clearance:  1. What type of surgery is being performed? COLONOSCOPY/ENDOSCOPY   2. When is this surgery scheduled? TBD SOMETIME IN MAY  3. What type of clearance is required (medical clearance vs. Pharmacy clearance to hold med vs. Both)? PAHRM  4. Are there any medications that need to be held prior to surgery and how long? ELIQUIS X 2 DAYS PRIOR   5. Practice name and name of physician performing surgery? [facility]; DR. Alessandra Bevels   6. What is your office phone number [phone]    7.   What is your office fax number [phone]  8.   Anesthesia type (None, local, MAC, general) ? PROPOFOL   Casey [name] 02/07/2020, 10:56 AM  _________________________________________________________________   (provider comments below)

## 2020-02-20 ENCOUNTER — Ambulatory Visit (HOSPITAL_COMMUNITY)
Admission: RE | Admit: 2020-02-20 | Discharge: 2020-02-20 | Disposition: A | Discharge status: Home / Self Care | Payer: PRIVATE HEALTH INSURANCE | Source: Ambulatory Visit | Attending: Urology | Admitting: Urology

## 2020-02-20 ENCOUNTER — Other Ambulatory Visit: Payer: Self-pay | Admitting: Urology

## 2020-02-20 ENCOUNTER — Encounter (HOSPITAL_COMMUNITY): Payer: Self-pay | Admitting: Emergency Medicine

## 2020-02-20 ENCOUNTER — Other Ambulatory Visit: Payer: Self-pay

## 2020-02-20 ENCOUNTER — Emergency Department (HOSPITAL_COMMUNITY)
Admission: EM | Admit: 2020-02-20 | Discharge: 2020-02-20 | Disposition: A | Discharge status: Home / Self Care | Payer: PRIVATE HEALTH INSURANCE | Attending: Emergency Medicine | Admitting: Emergency Medicine

## 2020-02-20 ENCOUNTER — Other Ambulatory Visit (HOSPITAL_COMMUNITY): Payer: Self-pay | Admitting: Urology

## 2020-02-20 ENCOUNTER — Emergency Department (HOSPITAL_COMMUNITY): Payer: PRIVATE HEALTH INSURANCE

## 2020-02-20 DIAGNOSIS — I110 Hypertensive heart disease with heart failure: Secondary | ICD-10-CM | POA: Insufficient documentation

## 2020-02-20 DIAGNOSIS — I5022 Chronic systolic (congestive) heart failure: Secondary | ICD-10-CM | POA: Diagnosis not present

## 2020-02-20 DIAGNOSIS — Z7984 Long term (current) use of oral hypoglycemic drugs: Secondary | ICD-10-CM | POA: Insufficient documentation

## 2020-02-20 DIAGNOSIS — N2889 Other specified disorders of kidney and ureter: Secondary | ICD-10-CM

## 2020-02-20 DIAGNOSIS — R1011 Right upper quadrant pain: Secondary | ICD-10-CM | POA: Diagnosis not present

## 2020-02-20 DIAGNOSIS — Z79899 Other long term (current) drug therapy: Secondary | ICD-10-CM | POA: Diagnosis not present

## 2020-02-20 DIAGNOSIS — Z87891 Personal history of nicotine dependence: Secondary | ICD-10-CM | POA: Diagnosis not present

## 2020-02-20 DIAGNOSIS — Z85858 Personal history of malignant neoplasm of other endocrine glands: Secondary | ICD-10-CM | POA: Insufficient documentation

## 2020-02-20 DIAGNOSIS — E119 Type 2 diabetes mellitus without complications: Secondary | ICD-10-CM | POA: Diagnosis not present

## 2020-02-20 LAB — URINALYSIS, ROUTINE W REFLEX MICROSCOPIC
Bilirubin Urine: NEGATIVE
Glucose, UA: NEGATIVE mg/dL
Hgb urine dipstick: NEGATIVE
Ketones, ur: 5 mg/dL — AB
Leukocytes,Ua: NEGATIVE
Nitrite: NEGATIVE
Protein, ur: NEGATIVE mg/dL
Specific Gravity, Urine: 1.024 (ref 1.005–1.030)
pH: 6 (ref 5.0–8.0)

## 2020-02-20 LAB — COMPREHENSIVE METABOLIC PANEL
ALT: 17 U/L (ref 0–44)
AST: 19 U/L (ref 15–41)
Albumin: 4 g/dL (ref 3.5–5.0)
Alkaline Phosphatase: 71 U/L (ref 38–126)
Anion gap: 9 (ref 5–15)
BUN: 14 mg/dL (ref 8–23)
CO2: 28 mmol/L (ref 22–32)
Calcium: 9.7 mg/dL (ref 8.9–10.3)
Chloride: 101 mmol/L (ref 98–111)
Creatinine, Ser: 0.97 mg/dL (ref 0.61–1.24)
GFR calc Af Amer: >60 mL/min (ref >60)
GFR calc non Af Amer: >60 mL/min (ref >60)
Glucose, Bld: 179 mg/dL — ABNORMAL HIGH (ref 70–99)
Potassium: 3.8 mmol/L (ref 3.5–5.1)
Sodium: 138 mmol/L (ref 135–145)
Total Bilirubin: 0.7 mg/dL (ref 0.3–1.2)
Total Protein: 7.8 g/dL (ref 6.5–8.1)

## 2020-02-20 LAB — CBC
HCT: 44.5 % (ref 39.0–52.0)
Hemoglobin: 14.2 g/dL (ref 13.0–17.0)
MCH: 29.5 pg (ref 26.0–34.0)
MCHC: 31.9 g/dL (ref 30.0–36.0)
MCV: 92.5 fL (ref 80.0–100.0)
Platelets: 238 10*3/uL (ref 150–400)
RBC: 4.81 MIL/uL (ref 4.22–5.81)
RDW: 13 % (ref 11.5–15.5)
WBC: 7.8 10*3/uL (ref 4.0–10.5)
nRBC: 0 % (ref 0.0–0.2)

## 2020-02-20 LAB — LIPASE, BLOOD: Lipase: 46 U/L (ref 11–51)

## 2020-02-20 MED ORDER — MORPHINE SULFATE (PF) 4 MG/ML IV SOLN
4.0000 mg | Freq: Once | INTRAVENOUS | Status: AC
  Administered 2020-02-20: 4 mg via INTRAVENOUS
  Filled 2020-02-20: qty 1

## 2020-02-20 MED ORDER — SODIUM CHLORIDE (PF) 0.9 % IJ SOLN
INTRAMUSCULAR | Status: AC
  Filled 2020-02-20: qty 50

## 2020-02-20 MED ORDER — KETOROLAC TROMETHAMINE 15 MG/ML IJ SOLN
15.0000 mg | Freq: Once | INTRAMUSCULAR | Status: AC
  Administered 2020-02-20: 15 mg via INTRAVENOUS
  Filled 2020-02-20: qty 1

## 2020-02-20 MED ORDER — HYDROMORPHONE HCL 2 MG PO TABS: 1.0000 mg | ORAL_TABLET | Freq: Four times a day (QID) | ORAL | 0 refills | Status: DC | PRN

## 2020-02-20 MED ORDER — IOHEXOL 300 MG/ML  SOLN
100.0000 mL | Freq: Once | INTRAMUSCULAR | Status: AC | PRN
  Administered 2020-02-20: 100 mL via INTRAVENOUS

## 2020-02-20 MED ORDER — HYDROMORPHONE HCL 1 MG/ML IJ SOLN
1.0000 mg | Freq: Once | INTRAMUSCULAR | Status: AC
  Administered 2020-02-20: 1 mg via INTRAVENOUS
  Filled 2020-02-20: qty 1

## 2020-02-20 NOTE — ED Notes (Signed)
Labeled urine and culture specimen sent to lab. [employer]

## 2020-02-20 NOTE — ED Triage Notes (Signed)
Patient c/o pain to abdomen, lower back, and bilateral flank. Reports taking abx at this time after dx prostatitis. States sent from Urology this morning after [place] and CT. States he was told he had a mass in his right kidney. Denies vomiting and diarrhea.

## 2020-02-20 NOTE — ED Provider Notes (Signed)
[facility] DEPT Provider Note   CSN: [id number] Arrival date & time: 02/20/20  1631     History Chief Complaint  Patient presents with  . Abdominal Pain    Casey [name]. is a 64 y.o. male.  HPI    Patient presents with concern of abdominal pain, nausea.  Patient is here with his wife who assists with the HPI.  Patient has multiple medical issues including multiple abdominal surgery, lap band, 1 prior kidney stone.  He notes that this pain began possibly few days ago, is right-sided, sore, severe, and with this concern he went to urology. There today he had ultrasound and noncontrast CT. He was informed that the CT study suggested a right-sided renal mass, otherwise was reassuring, as was the ultrasound.  With worsening pain in spite of home medication he presents for evaluation at the behest of our urology colleagues. No fever, no actual vomiting, no diarrhea though he did have loose stool.  History obtained by patient, his wife, and our colleague at urology via chart messaging who provided CT results.   Past Medical History:  Diagnosis Date  . Arthritis   . Atrial fibrillation ([place]) 07/2018  . Complication of anesthesia    diff. waking up  . Diabetes mellitus   . Dysrhythmia 07/2018   A-Fib  . Headache(784.0)   . Hypertension    dr Marisue Humble   pcp  . Sleep apnea    prior to weight lose surgery-Dr. Don Perking not use CPAP    Patient Active Problem List   Diagnosis Date Noted  . 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 Visit for monitoring Tikosyn therapy 08/31/2018  . Chronic systolic heart failure ([place])   . Persistent atrial fibrillation ([place])   . Hypogonadism in male 01/20/2017  . Uncontrolled type 2 diabetes mellitus with hyperglycemia, without long-term current use of insulin ([place]) 01/20/2017  . Lumbar stenosis with neurogenic claudication 03/22/2012    Past Surgical History:  Procedure Laterality Date  . APPENDECTOMY    .  BACK SURGERY     5  back surgeries  . CARDIAC CATHETERIZATION    . CARDIOVERSION N/A 08/13/2018   Procedure: CARDIOVERSION;  Surgeon: Larey Dresser, MD;  Location: [facility] ENDOSCOPY;  Service: Cardiovascular;  Laterality: N/A;  . CARDIOVERSION N/A 09/02/2018   Procedure: CARDIOVERSION;  Surgeon: Thayer Headings, MD;  Location: [facility];  Service: Cardiovascular;  Laterality: N/A;  . CARPAL TUNNEL RELEASE     bil  . COLON SURGERY     2004 for colon rupture  . HERNIA REPAIR    . LAPAROSCOPIC GASTRIC BANDING    . LEFT HEART CATH AND CORONARY ANGIOGRAPHY N/A 07/21/2018   Procedure: LEFT HEART CATH AND CORONARY ANGIOGRAPHY;  Surgeon: Belva Crome, MD;  Location: [facility] CV LAB;  Service: Cardiovascular;  Laterality: N/A;  . MANDIBLE FRACTURE SURGERY     x 2  . PAROTIDECTOMY Right 11/19/2018   Procedure: PAROTIDECTOMY;  Surgeon: Melida Quitter, MD;  Location: [facility];  Service: ENT;  Laterality: Right;  . TONSILLECTOMY         Family History  Problem Relation Age of Onset  . Diabetes Maternal Grandfather   . Heart disease Neg Hx     Social History   Tobacco Use  . Smoking status: Former Smoker    Packs/day: 0.90    Years: 15.00    Pack years: 13.50    Types: Cigarettes    Quit date: 01/19/2006  Years since quitting: 14.0  . Smokeless tobacco: Never Used  Substance Use Topics  . Alcohol use: No  . Drug use: No    Home Medications Prior to Admission medications   Medication Sig Start Date End Date Taking? Authorizing Provider  atorvastatin (LIPITOR) 10 MG tablet Take 10 mg by mouth at bedtime.  09/13/18  Yes [provider]  carvedilol (COREG) 25 MG tablet TAKE 1/2 TABLET (12.5MG ) BY MOUTH TWICE DAILY Patient taking differently: Take 12.5 mg by mouth 2 (two) times daily with a meal.  09/09/19  Yes Belva Crome, MD  dofetilide (TIKOSYN) 500 MCG capsule TAKE 1 CAPSULE (500 MCG TOTAL) BY MOUTH 2 (TWO) TIMES DAILY. 09/08/19  Yes Baldwin Jamaica, PA-C    doxycycline (VIBRA-TABS) 100 MG tablet Take 100 mg by mouth 2 (two) times daily. 02/15/20  Yes [provider]  ELIQUIS 5 MG TABS tablet TAKE 1 TABLET BY MOUTH TWICE A DAY 12/14/19  Yes Sherran Needs, NP  ENTRESTO 49-51 MG TAKE 1 TABLET BY MOUTH TWICE A DAY 08/26/19  Yes Belva Crome, MD  glimepiride (AMARYL) 2 MG tablet Take 2 mg by mouth daily with breakfast.   Yes [provider]  HYDROcodone-acetaminophen (NORCO) 10-325 MG tablet Take 1 tablet by mouth every 4 (four) hours as needed (for severe back pain.).   Yes [provider]  liraglutide (VICTOZA) 18 MG/3ML SOPN Inject 1.8 mg into the skin every evening.  11/26/16  Yes [provider]  magnesium gluconate (MAGONATE) 500 MG tablet Take 500 mg by mouth 2 (two) times daily.   Yes [provider]  metFORMIN (GLUCOPHAGE-XR) 500 MG 24 hr tablet Take 1,000 mg by mouth 2 (two) times daily. 08/19/18  Yes [provider]  Multiple Vitamin (MULTIVITAMIN WITH MINERALS) TABS tablet Take 1 tablet by mouth daily.   Yes [provider]  Multiple Vitamins-Minerals (SYSTANE ICAPS AREDS2) TABS Take 1 tablet by mouth daily.   Yes [provider]  spironolactone (ALDACTONE) 25 MG tablet TAKE 1/2 TABLET BY MOUTH EVERY DAY 09/08/19  Yes Belva Crome, MD  tamsulosin (FLOMAX) 0.4 MG CAPS capsule Take 0.4 mg by mouth daily. 02/15/20  Yes [provider]  topiramate (TOPAMAX) 25 MG tablet Take 25 mg by mouth in the morning, at noon, and at bedtime.  10/18/19  Yes [provider]  B-D UF III MINI PEN NEEDLES 31G X 5 MM MISC USE WITH VICTOZA AS DIRECTED ONCE A DAY 05/19/19   [provider]  glucose blood test strip USE TO TEST BLOOD SUGAR THREE TIMES A DAY 11/23/18   [provider]    Allergies    Penicillins and Percocet [oxycodone-acetaminophen]  Review of Systems   Review of Systems  Constitutional:       Per HPI, otherwise negative  HENT:       Per  HPI, otherwise negative  Respiratory:       Per HPI, otherwise negative  Cardiovascular:       Per HPI, otherwise negative  Gastrointestinal: Positive for abdominal pain and nausea. Negative for vomiting.  Endocrine:       Negative aside from HPI  Genitourinary:       Neg aside from HPI   Musculoskeletal:       Per HPI, otherwise negative  Skin: Negative.   Neurological: Positive for weakness. Negative for syncope.    Physical Exam Updated Vital Signs BP 113/76   Pulse 70   Temp 98.1 F (  36.7 C) (Oral)   Resp 18   Ht 5\' 10"  (1.778 m)   Wt 108 kg   SpO2 97%   BMI 34.15 kg/m   Physical Exam Vitals and nursing note reviewed.  Constitutional:      General: He is not in acute distress.    Appearance: He is well-developed. He is obese.  HENT:     Head: Normocephalic and atraumatic.  Eyes:     Conjunctiva/sclera: Conjunctivae normal.  Cardiovascular:     Rate and Rhythm: Normal rate and regular rhythm.  Pulmonary:     Effort: Pulmonary effort is normal. No respiratory distress.     Breath sounds: No stridor.  Abdominal:     General: There is no distension.     Tenderness: There is generalized abdominal tenderness and tenderness in the right upper quadrant and epigastric area. There is guarding.  Skin:    General: Skin is warm and dry.  Neurological:     Mental Status: He is alert and oriented to person, place, and time.     ED Results / Procedures / Treatments   Labs (all labs ordered are listed, but only abnormal results are displayed) Labs Reviewed  COMPREHENSIVE METABOLIC PANEL - Abnormal; Notable for the following components:      Result Value   Glucose, Bld 179 (*)    All other components within normal limits  URINALYSIS, ROUTINE W REFLEX MICROSCOPIC - Abnormal; Notable for the following components:   Color, Urine AMBER (*)    Ketones, ur 5 (*)    All other components within normal limits  LIPASE, BLOOD  CBC    Radiology CT Abdomen Pelvis W  Contrast  Result Date: 02/20/2020 CLINICAL DATA:  Abdominal pain, bilateral flank pain, right renal mass EXAM: CT ABDOMEN AND PELVIS WITH CONTRAST TECHNIQUE: Multidetector CT imaging of the abdomen and pelvis was performed using the standard protocol following bolus administration of intravenous contrast. CONTRAST:  136mL OMNIPAQUE IOHEXOL 300 MG/ML  SOLN COMPARISON:  07/11/2015, 05/03/2004 FINDINGS: Lower chest: No acute pleural or parenchymal lung disease. Hepatobiliary: No focal liver abnormality is seen. No gallstones, gallbladder wall thickening, or biliary dilatation. Pancreas: Unremarkable. No pancreatic ductal dilatation or surrounding inflammatory changes. Spleen: Normal in size without focal abnormality. Adrenals/Urinary Tract: Heterogeneous mass within the lateral upper aspect of the right kidney measuring 3.0 x 2.9 cm consistent with renal cell carcinoma. There is an indeterminate 1 cm hypodensity within the medial posterior aspect mid right kidney reference image 30. Further evaluation with dedicated renal MRI may be useful. Left kidney appears unremarkable. Bilateral adrenals are normal. No urinary tract calculi. The bladder is unremarkable. Stomach/Bowel: No bowel obstruction or ileus. Postsurgical changes from prior partial sigmoid colon resection and reanastomosis. No wall thickening or inflammatory changes. Postsurgical changes are seen from bariatric surgery with lap band identified. Vascular/Lymphatic: Aortic atherosclerosis. Nonspecific 16 mm right external iliac lymph node reference image 71 of series 2. No other pathologic adenopathy within the remainder of the abdomen or pelvis. Reproductive: Prostate is unremarkable. Other: Evidence of prior ventral hernia repair with surgical mesh in place. There is wide diastasis of the rectus musculature in the infraumbilical region, stable. No free fluid or free gas within the abdomen or pelvis. Musculoskeletal: Extensive postsurgical changes are seen  within the lower lumbar spine. Posterior fusion at L2/L3 with intervening discectomy. Multilevel laminectomies from L3 through S1. Prior discectomies from L3/L4 through L5/S1. Reconstructed images demonstrate no additional findings. IMPRESSION: 1. Suspicious 3 cm right renal mass, likely  renal cell carcinoma. Indeterminate 1 cm right renal mass. Further evaluation with dedicated renal MRI may be useful. 2. Nonspecific 16 mm right external iliac lymph node. No other pathologic adenopathy within the remainder of the abdomen or pelvis. 3. Aortic Atherosclerosis (ICD10-I70.0). Electronically Signed   By: Randa Ngo M.D.   On: 02/20/2020 20:44   US Abdomen Limited RUQ  Result Date: 02/20/2020 CLINICAL DATA:  RIGHT upper quadrant pain abdominal pain for 1 week. Gastric banding. EXAM: ULTRASOUND ABDOMEN LIMITED RIGHT UPPER QUADRANT COMPARISON:  None. FINDINGS: Gallbladder: No gallstones or wall thickening visualized. No sonographic Murphy sign noted by sonographer. Common bile duct: Diameter: Normal at 5 mm Liver: No focal lesion identified. Within normal limits in parenchymal echogenicity. Portal vein is patent on color Doppler imaging with normal direction of blood flow towards the liver. Other: None. IMPRESSION: Normal RIGHT upper quadrant ultrasound.  Normal gallbladder. Electronically Signed   By: Suzy Bouchard M.D.   On: 02/20/2020 14:31    CT results, noncontrast - earlier today IMPRESSION: 1. 2.9 cm complex exophytic lesion of the right kidney upper pole could be a complex cyst or a solid renal mass. Renal cell carcinoma not excluded. Consider follow up evaluation with sonography or renal protocol CT or MRI with and without contrast. 2. Nonobstructive right nephrolithiasis. 3. Other imaging findings of potential clinical significance: Circumflex and right coronary artery atherosclerosis. Rectus diastasis with anterior hernia meshes. Small left paracentral hernia containing adipose tissue from the  omentum. Extensive posterior decompressions at L2-L3-L4-L5-S1 with solid interbody fusions. Bilateral foraminal impingement at L5-S1 due to spurring, with mild right foraminal stenosis at L1-2.[name] Aortic Atherosclerosis (ICD10-I70.0). Electronically Signed By: Van Clines M.D. On: 02/20/2020 12:23    Procedures Procedures (including critical care time)  Medications Ordered in ED Medications  sodium chloride (PF) 0.9 % injection (has no administration in time range)  ketorolac (TORADOL) 15 MG/ML injection 15 mg (15 mg Intravenous Given 02/20/20 1932)  morphine 4 MG/ML injection 4 mg (4 mg Intravenous Given 02/20/20 1933)  iohexol (OMNIPAQUE) 300 MG/ML solution 100 mL (100 mLs Intravenous Contrast Given 02/20/20 2020)  HYDROmorphone (DILAUDID) injection 1 mg (1 mg Intravenous Given 02/20/20 2240)    ED Course  I have reviewed the triage vital signs and the nursing notes.  Pertinent labs & imaging results that were available during my care of the patient were reviewed by me and considered in my medical decision making (see chart for details).  Initial evaluation I discussed his presentation with the nurse practitioner from the urology office.  She shared that the CT was abnormal, concerning for renal lesion, but limited secondary to absence of contrast, oral, IV.  With concern for his Hx of lap-band and persistent pain, nausea, poss SBO, CT w contrast ordered.   Update:, Patient has improved somewhat, but continues to complain of severe pain. 11:01 PM Patient now substantially more comfortable after i receiving Dilaudid. He, his wife and I had a lengthy conversation about the CT imaging earlier and done today in the emergency department. With concern for findings consistent with renal cell malignancy, patient will follow up closely with his urologist. Patient has no ongoing vomiting, pain has been controlled, he has no hemodynamic instability, there is low suspicion for other acute  abdominal processes given the CT that was reassuring for GI pathology, in spite of the patient's medical history.  Patient and wife amenable to, appropriate for discharge, with close outpatient follow-up. Final Clinical Impression(s) / ED Diagnoses Final diagnoses:  Renal  mass    Rx / DC Orders ED Discharge Orders         Ordered    HYDROmorphone (DILAUDID) 2 MG tablet  Every 6 hours PRN     02/20/20 2303           Carmin Muskrat, MD 02/20/20 2304

## 2020-02-20 NOTE — Discharge Instructions (Signed)
As discussed, following up with your urologist the very important.  Please monitor your condition and do not hesitate to return here if you develop new, or concerning changes.

## 2020-02-23 DIAGNOSIS — M47812 Spondylosis without myelopathy or radiculopathy, cervical region: Secondary | ICD-10-CM | POA: Diagnosis not present

## 2020-02-23 DIAGNOSIS — M961 Postlaminectomy syndrome, not elsewhere classified: Secondary | ICD-10-CM | POA: Diagnosis not present

## 2020-02-23 DIAGNOSIS — R519 Headache, unspecified: Secondary | ICD-10-CM | POA: Diagnosis not present

## 2020-02-23 DIAGNOSIS — D49511 Neoplasm of unspecified behavior of right kidney: Secondary | ICD-10-CM | POA: Diagnosis not present

## 2020-02-23 NOTE — Progress Notes (Signed)
Cardiology Office Note:    Date:  02/24/2020   ID:  Casey Garbe., DOB [birth year], MRN [id number]  PCP:  Orpah Melter, MD  Cardiologist:  Sinclair Grooms, MD   Referring MD: Orpah Melter, MD   Chief Complaint  Patient presents with  . Coronary Artery Disease  . Atrial Fibrillation  . Advice Only    Clearance prior to nephrectomy    History of Present Illness:    Casey D Aydden Ulery. is a 64 y.o. male with a hx of atrial fibrillation, systolic left ventricular dysfunction with EF less than 35%--> 65% after conversion and addition of Tikosyn and GDMT for HF, and further w/u showed no obstructive CAD.  He denies chest discomfort.  He has not had shortness of breath.  No lower extremity swelling, angina, syncope, palpitations, claudication, orthopnea, PND, or medication side effects.  Significant recent abdominal and right flank pain led to evaluation and a mass was found in his right kidney.  This was not felt to be the source of the patient's pain but is suspected to be a renal cell carcinoma.  He does have a large lymph node in the right inguinal area.  This is being evaluated by Dr. Lovena Neighbours.  The patient will likely need partial/nephrectomy but because of significant prior abdominal surgeries, may need to be referred to [facility] to have the procedure done according to Casey [name].  Past Medical History:  Diagnosis Date  . Arthritis   . Atrial fibrillation ([place]) 07/2018  . Complication of anesthesia    diff. waking up  . Diabetes mellitus   . Dysrhythmia 07/2018   A-Fib  . Headache(784.0)   . Hypertension    dr Marisue Humble   pcp  . Sleep apnea    prior to weight lose surgery-Dr. Don Perking not use CPAP    Past Surgical History:  Procedure Laterality Date  . APPENDECTOMY    . BACK SURGERY     5  back surgeries  . CARDIAC CATHETERIZATION    . CARDIOVERSION N/A 08/13/2018   Procedure: CARDIOVERSION;  Surgeon: Larey Dresser, MD;  Location: [facility]  ENDOSCOPY;  Service: Cardiovascular;  Laterality: N/A;  . CARDIOVERSION N/A 09/02/2018   Procedure: CARDIOVERSION;  Surgeon: Thayer Headings, MD;  Location: [place];  Service: Cardiovascular;  Laterality: N/A;  . CARPAL TUNNEL RELEASE     bil  . COLON SURGERY     2004 for colon rupture  . HERNIA REPAIR    . LAPAROSCOPIC GASTRIC BANDING    . LEFT HEART CATH AND CORONARY ANGIOGRAPHY N/A 07/21/2018   Procedure: LEFT HEART CATH AND CORONARY ANGIOGRAPHY;  Surgeon: Belva Crome, MD;  Location: [place] [facility] CV LAB;  Service: Cardiovascular;  Laterality: N/A;  . MANDIBLE FRACTURE SURGERY     x 2  . PAROTIDECTOMY Right 11/19/2018   Procedure: PAROTIDECTOMY;  Surgeon: Melida Quitter, MD;  Location: [place];  Service: ENT;  Laterality: Right;  . TONSILLECTOMY      Current Medications: Current Meds  Medication Sig  . atorvastatin (LIPITOR) 10 MG tablet Take 10 mg by mouth at bedtime.   . B-D UF III MINI PEN NEEDLES 31G X 5 MM MISC USE WITH VICTOZA AS DIRECTED ONCE A DAY  . carvedilol (COREG) 25 MG tablet TAKE 1/2 TABLET (12.5MG ) BY MOUTH TWICE DAILY  . dofetilide (TIKOSYN) 500 MCG capsule TAKE 1 CAPSULE (500 MCG TOTAL) BY MOUTH 2 (TWO) TIMES DAILY.  [name] doxycycline (VIBRA-TABS) 100 MG tablet  Take 100 mg by mouth 2 (two) times daily.  [name] ELIQUIS 5 MG TABS tablet TAKE 1 TABLET BY MOUTH TWICE A DAY  . ENTRESTO 49-51 MG TAKE 1 TABLET BY MOUTH TWICE A DAY  . glimepiride (AMARYL) 2 MG tablet Take 2 mg by mouth daily with breakfast.  . glucose blood test strip USE TO TEST BLOOD SUGAR THREE TIMES A DAY  . HYDROcodone-acetaminophen (NORCO) 10-325 MG tablet Take 1 tablet by mouth every 4 (four) hours as needed (for severe back pain.).  [name] liraglutide (VICTOZA) 18 MG/3ML SOPN Inject 1.8 mg into the skin every evening.   . magnesium gluconate (MAGONATE) 500 MG tablet Take 500 mg by mouth 2 (two) times daily.  . metFORMIN (GLUCOPHAGE-XR) 500 MG 24 hr tablet Take 1,000 mg by mouth 2 (two) times daily.  .  Multiple Vitamin (MULTIVITAMIN WITH MINERALS) TABS tablet Take 1 tablet by mouth daily.  . Multiple Vitamins-Minerals (SYSTANE ICAPS AREDS2) TABS Take 1 tablet by mouth daily.  [name] spironolactone (ALDACTONE) 25 MG tablet TAKE 1/2 TABLET BY MOUTH EVERY DAY  . tamsulosin (FLOMAX) 0.4 MG CAPS capsule Take 0.4 mg by mouth daily.  [name] topiramate (TOPAMAX) 25 MG tablet Take 25 mg by mouth in the morning, at noon, and at bedtime.      Allergies:   Penicillins and Percocet [oxycodone-acetaminophen]   Social History   Socioeconomic History  . Marital status: Married    Spouse name: Not on file  . Number of children: Not on file  . Years of education: Not on file  . Highest education level: Not on file  Occupational History  . Not on file  Tobacco Use  . Smoking status: Former Smoker    Packs/day: 0.90    Years: 15.00    Pack years: 13.50    Types: Cigarettes    Quit date: 01/19/2006    Years since quitting: 14.1  . Smokeless tobacco: Never Used  Substance and Sexual Activity  . Alcohol use: No  . Drug use: No  . Sexual activity: Not on file  Other Topics Concern  . Not on file  Social History Narrative  . Not on file   Social Determinants of Health   Financial Resource Strain:   . Difficulty of Paying Living Expenses:   Food Insecurity:   . Worried About Charity fundraiser in the Last Year:   . Arboriculturist in the Last Year:   Transportation Needs:   . Film/video editor (Medical):   [name] Lack of Transportation (Non-Medical):   Physical Activity:   . Days of Exercise per Week:   . Minutes of Exercise per Session:   Stress:   . Feeling of Stress :   Social Connections:   . Frequency of Communication with Friends and Family:   . Frequency of Social Gatherings with Friends and Family:   . Attends Religious Services:   . Active Member of Clubs or Organizations:   . Attends Archivist Meetings:   [name] Marital Status:      Family History: The patient's family  history includes Diabetes in his maternal grandfather. There is no history of Heart disease.  ROS:   Please see the history of present illness.    Significant pain syndrome related to his back.  He follows with Dr. Maryjean Ka for this.  Otherwise no complaints.  All other systems reviewed and are negative.  EKGs/Labs/Other Studies Reviewed:    The following studies were reviewed today:  IMPRESSION: 1. Suspicious 3 cm right renal mass, likely renal cell carcinoma. Indeterminate 1 cm right renal mass. Further evaluation with dedicated renal MRI may be useful. 2. Nonspecific 16 mm right external iliac lymph node. No other pathologic adenopathy within the remainder of the abdomen or pelvis. 3. Aortic Atherosclerosis (ICD10-I70.0).  2D Doppler echocardiogram January 2020: Study Conclusions   - Left ventricle: The cavity size was normal. Systolic function was  normal. Wall motion was normal; there were no regional wall  motion abnormalities. Doppler parameters are consistent with  abnormal left ventricular relaxation (grade 1 diastolic  dysfunction). Doppler parameters are consistent with  indeterminate ventricular filling pressure.  - Aortic valve: Transvalvular velocity was within the normal range.  There was no stenosis. There was mild regurgitation.  - Aorta: Ascending aortic diameter: 37 mm (S).  - Ascending aorta: The ascending aorta was mildly dilated.  - Mitral valve: Transvalvular velocity was within the normal range.  There was no evidence for stenosis. There was no regurgitation.  - Right ventricle: The cavity size was normal. Wall thickness was  normal. Systolic function was normal.  - Atrial septum: No defect or patent foramen ovale was identified.  - Tricuspid valve: Transvalvular velocity was within the normal  range. There was no regurgitation.  - Pulmonary arteries: Systolic pressure could not be accurately  estimated.  - Pericardium, extracardiac:  A trivial pericardial effusion was  identified.  - Global longitudinal strain -15.1% (mildly abnormal).   Cardiac catheterization August 2019: Diagnostic Dominance: Right  Intervention    EKG:  EKG reveals normal sinus rhythm, left axis deviation, no acute ST-T wave change.  This tracing was performed on 02/01/2020  Recent Labs: 02/01/2020: Magnesium 1.9 02/20/2020: ALT 17; BUN 14; Creatinine, Ser 0.97; Hemoglobin 14.2; Platelets 238; Potassium 3.8; Sodium 138  Recent Lipid Panel    Component Value Date/Time   CHOL 129 12/08/2018 1113   TRIG 89 12/08/2018 1113   HDL 45 12/08/2018 1113   CHOLHDL 2.9 12/08/2018 1113   LDLCALC 66 12/08/2018 1113    Physical Exam:    VS:  BP 112/64   Pulse (!) 59   Ht 5\' 10"  (1.778 m)   Wt 238 lb (108 kg)   SpO2 97%   BMI 34.15 kg/m     Wt Readings from Last 3 Encounters:  02/24/20 238 lb (108 kg)  02/20/20 238 lb (108 kg)  02/01/20 238 lb 12.8 oz (108.3 kg)     GEN: Moderate obesity. No acute distress HEENT: Normal NECK: No JVD. LYMPHATICS: No lymphadenopathy CARDIAC:  RRR without murmur, gallop, or edema. VASCULAR:  Normal Pulses. No bruits. RESPIRATORY:  Clear to auscultation without rales, wheezing or rhonchi  ABDOMEN: Soft, non-tender, non-distended, No pulsatile mass, MUSCULOSKELETAL: No deformity  SKIN: Warm and dry NEUROLOGIC:  Alert and oriented x 3 PSYCHIATRIC:  Normal affect   ASSESSMENT:    1. Coronary artery disease involving native coronary artery of native heart without angina pectoris   2. Preoperative clearance   3. Right renal mass   4. Persistent atrial fibrillation ([place])   5. Chronic systolic heart failure ([place])   6. Hyperlipidemia with target LDL less than 70   7. Secondary hypercoagulable state ([place])   8. Uncontrolled type 2 diabetes mellitus with hyperglycemia, without long-term current use of insulin ([place])   9. Obstructive sleep apnea (adult) (pediatric)   10. Educated about COVID-19 virus infection     PLAN:    In order of problems listed above:  1.  Coronary disease is stable.  Secondary prevention discussed.  Coronary angiography done in 2019 demonstrated distal right coronary disease but no other angiographically significant disease.  He had borderline obtuse marginal LAD stenoses.  In absence of symptoms, I do not believe any functional testing is needed. 2. Unless he develops symptoms in the interim, he is cleared to proceed with general anesthesia and nephrectomy without further cardiovascular evaluation.  His risk will be moderate for cardiovascular complications such as atrial fibrillation, volume overload, and less so for ischemic complications. 3. Upcoming surgery at some point hopefully by laparoscopic approach. 4. Maintaining normal sinus rhythm on dofetilide. 5. Systolic heart failure has resolved to diastolic heart failure without evidence of volume overload.  Continue Entresto and carvedilol. 6. Continue atorvastatin with target LDL less than 70. 7. Continue anticoagulation therapy with Eliquis. 8. Hemoglobin A1c target less than 7.  We did discuss carbohydrate limitation in diet.  He should be considered for an SGLT2 agent given his cardiac history.  Jardiance or Wilder Glade would be good choices. 9. Continue CPAP use. 10. COVID-19 vaccine has been received.  Social distancing is being practiced.  Clinical follow-up in 38months hopefully after successful management of renal cell carcinoma.   Medication Adjustments/Labs and Tests Ordered: Current medicines are reviewed at length with the patient today.  Concerns regarding medicines are outlined above.  No orders of the defined types were placed in this encounter.  No orders of the defined types were placed in this encounter.   Patient Instructions  Medication Instructions:  Your physician recommends that you continue on your current medications as directed. Please refer to the Current Medication list given to you  today.  *If you need a refill on your cardiac medications before your next appointment, please call your pharmacy*   Lab Work: None If you have labs (blood work) drawn today and your tests are completely normal, you will receive your results only by: [name] MyChart Message (if you have MyChart) OR . A paper copy in the mail If you have any lab test that is abnormal or we need to change your treatment, we will call you to review the results.   Testing/Procedures: None   Follow-Up: At [facility], you and your health needs are our priority.  As part of our continuing mission to provide you with exceptional heart care, we have created designated Provider Care Teams.  These Care Teams include your primary Cardiologist (physician) and Advanced Practice Providers (APPs -  Physician Assistants and Nurse Practitioners) who all work together to provide you with the care you need, when you need it.  We recommend signing up for the patient portal called "MyChart".  Sign up information is provided on this After Visit Summary.  MyChart is used to connect with patients for Virtual Visits (Telemedicine).  Patients are able to view lab/test results, encounter notes, upcoming appointments, etc.  Non-urgent messages can be sent to your provider as well.   To learn more about what you can do with MyChart, go to [email or web address].    Your next appointment:   6 month(s)  The format for your next appointment:   In Person  Provider:   You may see Sinclair Grooms, MD or one of the following Advanced Practice Providers on your designated Care Team:    Truitt Merle, NP  Cecilie Kicks, NP  Kathyrn Drown, NP    Other Instructions      Signed, Sinclair Grooms, MD  02/24/2020 11:08 AM  [facility]

## 2020-02-24 ENCOUNTER — Other Ambulatory Visit: Payer: Self-pay

## 2020-02-24 ENCOUNTER — Encounter: Payer: Self-pay | Admitting: Interventional Cardiology

## 2020-02-24 ENCOUNTER — Ambulatory Visit (INDEPENDENT_AMBULATORY_CARE_PROVIDER_SITE_OTHER): Payer: PRIVATE HEALTH INSURANCE | Admitting: Interventional Cardiology

## 2020-02-24 VITALS — BP 112/64 | HR 59 | Ht 70.0 in | Wt 238.0 lb

## 2020-02-24 DIAGNOSIS — I4819 Other persistent atrial fibrillation: Secondary | ICD-10-CM | POA: Diagnosis not present

## 2020-02-24 DIAGNOSIS — N2889 Other specified disorders of kidney and ureter: Secondary | ICD-10-CM

## 2020-02-24 DIAGNOSIS — I2510 Atherosclerotic heart disease of native coronary artery without angina pectoris: Secondary | ICD-10-CM | POA: Diagnosis not present

## 2020-02-24 DIAGNOSIS — Z7189 Other specified counseling: Secondary | ICD-10-CM

## 2020-02-24 DIAGNOSIS — E1165 Type 2 diabetes mellitus with hyperglycemia: Secondary | ICD-10-CM

## 2020-02-24 DIAGNOSIS — I5022 Chronic systolic (congestive) heart failure: Secondary | ICD-10-CM | POA: Diagnosis not present

## 2020-02-24 DIAGNOSIS — G4733 Obstructive sleep apnea (adult) (pediatric): Secondary | ICD-10-CM

## 2020-02-24 DIAGNOSIS — Z01818 Encounter for other preprocedural examination: Secondary | ICD-10-CM | POA: Diagnosis not present

## 2020-02-24 DIAGNOSIS — D6869 Other thrombophilia: Secondary | ICD-10-CM | POA: Diagnosis not present

## 2020-02-24 DIAGNOSIS — E785 Hyperlipidemia, unspecified: Secondary | ICD-10-CM

## 2020-02-24 NOTE — Patient Instructions (Signed)

## 2020-02-27 ENCOUNTER — Other Ambulatory Visit: Payer: Self-pay | Admitting: Urology

## 2020-02-27 DIAGNOSIS — D49511 Neoplasm of unspecified behavior of right kidney: Secondary | ICD-10-CM

## 2020-03-17 ENCOUNTER — Ambulatory Visit
Admission: RE | Admit: 2020-03-17 | Discharge: 2020-03-17 | Disposition: A | Discharge status: Home / Self Care | Payer: PRIVATE HEALTH INSURANCE | Source: Ambulatory Visit | Attending: Urology | Admitting: Urology

## 2020-03-17 DIAGNOSIS — D49511 Neoplasm of unspecified behavior of right kidney: Secondary | ICD-10-CM

## 2020-03-17 MED ORDER — GADOBENATE DIMEGLUMINE 529 MG/ML IV SOLN
20.0000 mL | Freq: Once | INTRAVENOUS | Status: AC | PRN
  Administered 2020-03-17: 20 mL via INTRAVENOUS

## 2020-03-20 ENCOUNTER — Other Ambulatory Visit: Payer: Self-pay | Admitting: Urology

## 2020-03-20 ENCOUNTER — Telehealth: Payer: Self-pay | Admitting: Interventional Cardiology

## 2020-03-20 NOTE — Telephone Encounter (Signed)
   [facility] Pre-operative Risk Assessment    Request for surgical clearance:  1. What type of surgery is being performed? Robotic partial versus radical nephrectomy  2. When is this surgery scheduled? 04/04/20   3. What type of clearance is required (medical clearance vs. Pharmacy clearance to hold med vs. Both)? both  4. Are there any medications that need to be held prior to surgery and how long? Eliquis - 48 hours prior  5. Practice name and name of physician performing surgery? Dr. Alexis Frock / [facility]  6. What is your office phone number [phone] ext [phone]   7.   What is your office fax number [phone]  8.   Anesthesia type (None, local, MAC, general) ? General - for 3.5 hours   Casey [name] 03/20/2020, 1:36 PM  _________________________________________________________________   (provider comments below)

## 2020-03-20 NOTE — Telephone Encounter (Signed)
Patient with diagnosis of afib on Eliquis for anticoagulation.    Procedure: Robotic partial versus radical nephrectomy Date of procedure: 04/04/20  CHADS2-VASc score of 4 (CHF, HTN, DM, CAD). Pt was cardioverted on Tikosyn in October 2020.  CrCl >155mL/min Platelet count 238  Per office protocol, patient can hold Eliquis for 2 days prior to procedure as requested.

## 2020-03-20 NOTE — Telephone Encounter (Signed)
   Primary Cardiologist: Sinclair Grooms, MD  Chart reviewed as part of pre-operative protocol coverage. Given past medical history and time since last visit, based on ACC/AHA guidelines, Casey D Shadee Ridl. would be at acceptable risk for the planned procedure without further cardiovascular testing.   Patient with diagnosis of afib on Eliquis for anticoagulation.    Procedure: Robotic partial versus radical nephrectomy Date of procedure: 04/04/20  CHADS2-VASc score of 4 (CHF, HTN, DM, CAD). Pt was cardioverted on Tikosyn in October 2020.  CrCl >156mL/min Platelet count 238  Per office protocol, patient can hold Eliquis for 2 days prior to procedure as requested.  I will route this recommendation to the requesting party via Epic fax function and remove from pre-op pool.  Please call with questions.  Jossie Ng. [facility] [place] [address] Office [phone] Fax [phone]

## 2020-03-27 NOTE — Patient Instructions (Addendum)
DUE TO COVID-19 ONLY ONE VISITOR IS ALLOWED TO COME WITH YOU AND STAY IN THE WAITING ROOM ONLY DURING PRE OP AND PROCEDURE DAY OF SURGERY. THE 1 VISITOR MAY VISIT WITH YOU AFTER SURGERY IN YOUR PRIVATE ROOM DURING VISITING HOURS ONLY!  YOU NEED TO HAVE A COVID 19 TEST ON: 03/31/20 @ 11:00 am , THIS TEST MUST BE DONE BEFORE SURGERY, COME  [address], [address] [place] , [id number].  ([place]) ONCE YOUR COVID TEST IS COMPLETED, PLEASE BEGIN THE QUARANTINE INSTRUCTIONS AS OUTLINED IN YOUR HANDOUT.                Casey [name].     Your procedure is scheduled on: 04/04/20   Report to [facility] Main  Entrance   Report to admitting at: 6:30 AM     Call this number if you have problems the morning of surgery [phone]    Remember: Do not eat food or drink liquids :After Midnight.   BRUSH YOUR TEETH MORNING OF SURGERY AND RINSE YOUR MOUTH OUT, NO CHEWING GUM CANDY OR MINTS.     Take these medicines the morning of surgery with A SIP OF WATER: carvedilol,tikosyn,topomax.entresto  How to Manage Your Diabetes Before and After Surgery  Why is it important to control my blood sugar before and after surgery? . Improving blood sugar levels before and after surgery helps healing and can limit problems. . A way of improving blood sugar control is eating a healthy diet by: o  Eating less sugar and carbohydrates o  Increasing activity/exercise o  Talking with your doctor about reaching your blood sugar goals . High blood sugars (greater than 180 mg/dL) can raise your risk of infections and slow your recovery, so you will need to focus on controlling your diabetes during the weeks before surgery. . Make sure that the doctor who takes care of your diabetes knows about your planned surgery including the date and location.  How do I manage my blood sugar before surgery? . Check your blood sugar at least 4 times a day, starting 2 days before surgery, to make sure that the  level is not too high or low. o Check your blood sugar the morning of your surgery when you wake up and every 2 hours until you get to the Short Stay unit. . If your blood sugar is less than 70 mg/dL, you will need to treat for low blood sugar: o Do not take insulin. o Treat a low blood sugar (less than 70 mg/dL) with  cup of clear juice (cranberry or apple), 4 glucose tablets, OR glucose gel. o Recheck blood sugar in 15 minutes after treatment (to make sure it is greater than 70 mg/dL). If your blood sugar is not greater than 70 mg/dL on recheck, call [phone] for further instructions. . Report your blood sugar to the short stay nurse when you get to Short Stay.  . If you are admitted to the hospital after surgery: o Your blood sugar will be checked by the staff and you will probably be given insulin after surgery (instead of oral diabetes medicines) to make sure you have good blood sugar levels. o The goal for blood sugar control after surgery is 80-180 mg/dL.   WHAT DO I DO ABOUT MY DIABETES MEDICATION?  [name] Do not take oral diabetes medicines (pills) the morning of surgery.        THE DAY BEFORE SURGERY, take Amaryl and Metformin as usual.  .  THE MORNING OF SURGERY Do not  Take diabetes medicine.  . The night before surgery use Victosa as usual.  . The day of surgery, do not take other diabetes injectables, including Byetta (exenatide), Bydureon (exenatide ER), Victoza (liraglutide), or Trulicity (dulaglutide).  . If your CBG is greater than 220 mg/dL, you may take  of your sliding scale  . (correction) dose of insulin.     DO NOT TAKE ANY DIABETIC MEDICATIONS DAY OF YOUR SURGERY                               You may not have any metal on your body including hair pins and              piercings  Do not wear jewelry, lotions, powders or perfumes, deodorant             Men may shave face and neck.   Do not bring valuables to the hospital. [place].  Contacts, dentures or bridgework may not be worn into surgery.  Leave suitcase in the car. After surgery it may be brought to your room.     Patients discharged the day of surgery will not be allowed to drive home. IF YOU ARE HAVING SURGERY AND GOING HOME THE SAME DAY, YOU MUST HAVE AN ADULT TO DRIVE YOU HOME AND BE WITH YOU FOR 24 HOURS. YOU MAY GO HOME BY TAXI OR UBER OR ORTHERWISE, BUT AN ADULT MUST ACCOMPANY YOU HOME AND STAY WITH YOU FOR 24 HOURS.  Name and phone number of your driver:  Special Instructions: N/A              Please read over the following fact sheets you were given: _____________________________________________________________________             [facility] - Preparing for Surgery Before surgery, you can play an important role.  Because skin is not sterile, your skin needs to be as free of germs as possible.  You can reduce the number of germs on your skin by washing with CHG (chlorahexidine gluconate) soap before surgery.  CHG is an antiseptic cleaner which kills germs and bonds with the skin to continue killing germs even after washing. Please DO NOT use if you have an allergy to CHG or antibacterial soaps.  If your skin becomes reddened/irritated stop using the CHG and inform your nurse when you arrive at Short Stay. Do not shave (including legs and underarms) for at least 48 hours prior to the first CHG shower.  You may shave your face/neck. Please follow these instructions carefully:  1.  Shower with CHG Soap the night before surgery and the  morning of Surgery.  2.  If you choose to wash your hair, wash your hair first as usual with your  normal  shampoo.  3.  After you shampoo, rinse your hair and body thoroughly to remove the  shampoo.                           4.  Use CHG as you would any other liquid soap.  You can apply chg directly  to the skin and wash  Gently with a scrungie or clean washcloth.  5.  Apply  the CHG Soap to your body ONLY FROM THE NECK DOWN.   Do not use on face/ open                           Wound or open sores. Avoid contact with eyes, ears mouth and genitals (private parts).                       Wash face,  Genitals (private parts) with your normal soap.             6.  Wash thoroughly, paying special attention to the area where your surgery  will be performed.  7.  Thoroughly rinse your body with warm water from the neck down.  8.  DO NOT shower/wash with your normal soap after using and rinsing off  the CHG Soap.                9.  Pat yourself dry with a clean towel.            10.  Wear clean pajamas.            11.  Place clean sheets on your bed the night of your first shower and do not  sleep with pets. Day of Surgery : Do not apply any lotions/deodorants the morning of surgery.  Please wear clean clothes to the hospital/surgery center.  FAILURE TO FOLLOW THESE INSTRUCTIONS MAY RESULT IN THE CANCELLATION OF YOUR SURGERY PATIENT SIGNATURE_________________________________  NURSE SIGNATURE__________________________________  ________________________________________________________________________

## 2020-03-29 ENCOUNTER — Encounter (HOSPITAL_COMMUNITY)
Admission: RE | Admit: 2020-03-29 | Discharge: 2020-03-29 | Disposition: A | Discharge status: Home / Self Care | Payer: PRIVATE HEALTH INSURANCE | Source: Ambulatory Visit | Attending: Urology | Admitting: Urology

## 2020-03-29 ENCOUNTER — Other Ambulatory Visit: Payer: Self-pay

## 2020-03-29 ENCOUNTER — Encounter (HOSPITAL_COMMUNITY): Payer: Self-pay

## 2020-03-29 DIAGNOSIS — I129 Hypertensive chronic kidney disease with stage 1 through stage 4 chronic kidney disease, or unspecified chronic kidney disease: Secondary | ICD-10-CM | POA: Insufficient documentation

## 2020-03-29 DIAGNOSIS — Z7984 Long term (current) use of oral hypoglycemic drugs: Secondary | ICD-10-CM | POA: Diagnosis not present

## 2020-03-29 DIAGNOSIS — Z87891 Personal history of nicotine dependence: Secondary | ICD-10-CM | POA: Diagnosis not present

## 2020-03-29 DIAGNOSIS — N189 Chronic kidney disease, unspecified: Secondary | ICD-10-CM | POA: Diagnosis not present

## 2020-03-29 DIAGNOSIS — I4891 Unspecified atrial fibrillation: Secondary | ICD-10-CM | POA: Diagnosis not present

## 2020-03-29 DIAGNOSIS — Z79899 Other long term (current) drug therapy: Secondary | ICD-10-CM | POA: Insufficient documentation

## 2020-03-29 DIAGNOSIS — N2889 Other specified disorders of kidney and ureter: Secondary | ICD-10-CM | POA: Insufficient documentation

## 2020-03-29 DIAGNOSIS — Z7901 Long term (current) use of anticoagulants: Secondary | ICD-10-CM | POA: Diagnosis not present

## 2020-03-29 DIAGNOSIS — E1122 Type 2 diabetes mellitus with diabetic chronic kidney disease: Secondary | ICD-10-CM | POA: Insufficient documentation

## 2020-03-29 DIAGNOSIS — Z01812 Encounter for preprocedural laboratory examination: Secondary | ICD-10-CM | POA: Insufficient documentation

## 2020-03-29 HISTORY — DX: Chronic kidney disease, unspecified: N18.9

## 2020-03-29 HISTORY — DX: Malignant (primary) neoplasm, unspecified: C80.1

## 2020-03-29 NOTE — Progress Notes (Signed)
PCP - Dr. Anne Shutter Cardiologist - Dr. Tamala Julian.  Chest x-ray -  EKG -  Stress Test -  ECHO -  Cardiac Cath -   Sleep Study - yes CPAP - yes  Fasting Blood Sugar - 80's-100's Checks Blood Sugar ___1__ times a day  Blood Thinner Instructions:Eliquis will be stop five days before surgery. Aspirin Instructions: Last Dose:  Anesthesia review: Hx. Afib,HTN,OSA.,DIA.  Patient denies shortness of breath, fever, cough and chest pain at PAT appointment   Patient verbalized understanding of instructions that were given to them at the PAT appointment. Patient was also instructed that they will need to review over the PAT instructions again at home before surgery.

## 2020-03-30 ENCOUNTER — Encounter (HOSPITAL_COMMUNITY): Payer: Self-pay

## 2020-03-30 ENCOUNTER — Encounter (HOSPITAL_COMMUNITY)
Admission: RE | Admit: 2020-03-30 | Discharge: 2020-03-30 | Disposition: A | Discharge status: Home / Self Care | Payer: PRIVATE HEALTH INSURANCE | Source: Ambulatory Visit | Attending: Urology | Admitting: Urology

## 2020-03-30 DIAGNOSIS — E1122 Type 2 diabetes mellitus with diabetic chronic kidney disease: Secondary | ICD-10-CM | POA: Diagnosis not present

## 2020-03-30 DIAGNOSIS — N2889 Other specified disorders of kidney and ureter: Secondary | ICD-10-CM | POA: Diagnosis not present

## 2020-03-30 DIAGNOSIS — I4891 Unspecified atrial fibrillation: Secondary | ICD-10-CM | POA: Diagnosis not present

## 2020-03-30 DIAGNOSIS — N189 Chronic kidney disease, unspecified: Secondary | ICD-10-CM | POA: Diagnosis not present

## 2020-03-30 DIAGNOSIS — Z01812 Encounter for preprocedural laboratory examination: Secondary | ICD-10-CM | POA: Diagnosis not present

## 2020-03-30 DIAGNOSIS — I129 Hypertensive chronic kidney disease with stage 1 through stage 4 chronic kidney disease, or unspecified chronic kidney disease: Secondary | ICD-10-CM | POA: Diagnosis not present

## 2020-03-30 LAB — BASIC METABOLIC PANEL
Anion gap: 10 (ref 5–15)
BUN: 28 mg/dL — ABNORMAL HIGH (ref 8–23)
CO2: 25 mmol/L (ref 22–32)
Calcium: 9.6 mg/dL (ref 8.9–10.3)
Chloride: 106 mmol/L (ref 98–111)
Creatinine, Ser: 1 mg/dL (ref 0.61–1.24)
GFR calc Af Amer: >60 mL/min (ref >60)
GFR calc non Af Amer: >60 mL/min (ref >60)
Glucose, Bld: 128 mg/dL — ABNORMAL HIGH (ref 70–99)
Potassium: 4.4 mmol/L (ref 3.5–5.1)
Sodium: 141 mmol/L (ref 135–145)

## 2020-03-30 LAB — CBC
HCT: 41.9 % (ref 39.0–52.0)
Hemoglobin: 13.8 g/dL (ref 13.0–17.0)
MCH: 30.1 pg (ref 26.0–34.0)
MCHC: 32.9 g/dL (ref 30.0–36.0)
MCV: 91.5 fL (ref 80.0–100.0)
Platelets: 231 10*3/uL (ref 150–400)
RBC: 4.58 MIL/uL (ref 4.22–5.81)
RDW: 13.7 % (ref 11.5–15.5)
WBC: 8.1 10*3/uL (ref 4.0–10.5)
nRBC: 0 % (ref 0.0–0.2)

## 2020-03-30 LAB — GLUCOSE, CAPILLARY: Glucose-Capillary: 112 mg/dL — ABNORMAL HIGH (ref 70–99)

## 2020-03-30 LAB — HEMOGLOBIN A1C
Hgb A1c MFr Bld: 7 % — ABNORMAL HIGH (ref 4.8–5.6)
Mean Plasma Glucose: 154.2 mg/dL

## 2020-03-31 ENCOUNTER — Other Ambulatory Visit (HOSPITAL_COMMUNITY)
Admission: RE | Admit: 2020-03-31 | Discharge: 2020-03-31 | Disposition: A | Discharge status: Home / Self Care | Payer: PRIVATE HEALTH INSURANCE | Source: Ambulatory Visit | Attending: Urology | Admitting: Urology

## 2020-03-31 DIAGNOSIS — Z01812 Encounter for preprocedural laboratory examination: Secondary | ICD-10-CM | POA: Diagnosis not present

## 2020-03-31 DIAGNOSIS — Z20822 Contact with and (suspected) exposure to covid-19: Secondary | ICD-10-CM | POA: Insufficient documentation

## 2020-03-31 LAB — SARS CORONAVIRUS 2 (TAT 6-24 HRS): SARS Coronavirus 2: NEGATIVE

## 2020-04-02 NOTE — Progress Notes (Signed)
Anesthesia Chart Review   Case: [id number] Date/Time: 04/04/20 0800   Procedure: XI ROBOTIC ASSITED PARTIAL NEPHRECTOMY (Right ) - 3.5 HRS   Anesthesia type: General   Pre-op diagnosis: RIGHT RENAL MASSES   Location: [facility]OM 03 / [facility]   Surgeons: Alexis Frock, MD      DISCUSSION:64 y.o. former smoker (13.5 pack years, quit 01/19/06) with h/o DM II, HTN, sleep apnea, CKD, atrial fibrillation (on Eliquis), right renal masses scheduled for above procedure 04/04/2020 with Dr. Alexis Frock.    H/o of atrial fibrillation, systolic left ventricular dysfunction with EF less than 35%--> 65% after conversion and addition of Tikosyn and GDMT for HF, and further w/u showed no obstructive CAD.  Per cardiology note 03/20/2020, "Chart reviewed as part of pre-operative protocol coverage. Given past medical history and time since last visit, based on ACC/AHA guidelines, Casey [name]. would be at acceptable risk for the planned procedure without further cardiovascular testing.  Patient with diagnosis ofafibon Eliquisfor anticoagulation.  Procedure:Robotic partial versus radical nephrectomy Date of procedure:04/04/20 CHADS2-VASc score of4 (CHF, HTN, DM, CAD). Pt was cardioverted on Tikosyn in October 2020. CrCl>162mL/min Platelet count238 Per office protocol, patient can hold Eliquis for 2days prior to procedureas requested."  Anticipate pt can proceed with planned procedure barring acute status change.   VS: BP (!) 145/92   Pulse 63   Temp 36.9 C (Oral)   Resp 18   Ht 5\' 10"  (1.778 m)   Wt 106.2 kg   SpO2 99%   BMI 33.60 kg/m   PROVIDERS: Orpah Melter, MD is PCP   Daneen Schick, MD is Cardiologist  LABS: Labs reviewed: Acceptable for surgery. (all labs ordered are listed, but only abnormal results are displayed)  Labs Reviewed  GLUCOSE, CAPILLARY - Abnormal; Notable for the following components:      Result Value   Glucose-Capillary 112 (*)    All other components within  normal limits  BASIC METABOLIC PANEL - Abnormal; Notable for the following components:   Glucose, Bld 128 (*)    BUN 28 (*)    All other components within normal limits  HEMOGLOBIN A1C - Abnormal; Notable for the following components:   Hgb A1c MFr Bld 7.0 (*)    All other components within normal limits  CBC     IMAGES:   EKG: 02/01/2020 Rate 67 bpm Normal sinus rhythm with sinus arrhythmia Normal ECG SINCE LAST TRACING HEART RATE HAS INCREASED  CV: Echo 12/03/2018 Study Conclusions   - Left ventricle: The cavity size was normal. Systolic function was  normal. Wall motion was normal; there were no regional wall  motion abnormalities. Doppler parameters are consistent with  abnormal left ventricular relaxation (grade 1 diastolic  dysfunction). Doppler parameters are consistent with  indeterminate ventricular filling pressure.  - Aortic valve: Transvalvular velocity was within the normal range.  There was no stenosis. There was mild regurgitation.  - Aorta: Ascending aortic diameter: 37 mm (S).  - Ascending aorta: The ascending aorta was mildly dilated.  - Mitral valve: Transvalvular velocity was within the normal range.  There was no evidence for stenosis. There was no regurgitation.  - Right ventricle: The cavity size was normal. Wall thickness was  normal. Systolic function was normal.  - Atrial septum: No defect or patent foramen ovale was identified.  - Tricuspid valve: Transvalvular velocity was within the normal  range. There was no regurgitation.  - Pulmonary arteries: Systolic pressure could not be accurately  estimated.  -  Pericardium, extracardiac: A trivial pericardial effusion was  identified.  - Global longitudinal strain -15.1% (mildly abnormal).   Cardiac Cath 07/21/2018  Moderate three-vessel coronary artery disease.  60 to 70% mid RCA and 95% stenosis of the distal right coronary beyond the origin of the PDA.  Normal left  main  Mid LAD 60 to 70% stenosis beyond the origin of the dominant first diagonal.  Large ramus intermedius with luminal irregularity.  Relatively small distribution circumflex with 70% obstruction in the first marginal and total occlusion of the distal circumflex before the origin of the small second obtuse marginal.  There are faint left to left collaterals to the distal circumflex.  Moderately severe left ventricular dysfunction with global hypokinesis, EF 35%.  Normal LVEDP.    RECOMMENDATIONS:   In absence of significant/limiting anginal symptoms, would recommend anti-ischemic therapy with beta-blockade and long-acting nitrates.  If symptoms develop or become refractory PCI of the very distal RCA, and LAD would be possible.  Lesions were not angiographically critical, and therefore based on data from the Courage trial, medical therapy would seem to be adequate.  Recommend guideline directed therapy for systolic heart failure: Transition to Entresto from ARB, transition Tenormin to carvedilol, add mineralocorticoid receptor antagonist (Aldactone or eplerenone).  With reference to heart failure, diabetes management should include an SGLT 2 agent to decrease incidence of heart failure symptoms.  Finally it may be helpful to have restoration of sinus rhythm.  LV dysfunction is out of proportion to the degree of coronary disease. Past Medical History:  Diagnosis Date  . Arthritis   . Atrial fibrillation ([place]) 07/2018  . Cancer ([place])    skin Ca on nose,kidney  . Chronic kidney disease   . Complication of anesthesia    diff. waking up  . Diabetes mellitus   . Dysrhythmia 07/2018   A-Fib  . Headache(784.0)   . Hypertension    dr Marisue Humble   pcp  . Sleep apnea    prior to weight lose surgery-Dr. Don Perking not use CPAP    Past Surgical History:  Procedure Laterality Date  . APPENDECTOMY    . BACK SURGERY     5  back surgeries  . CARDIAC CATHETERIZATION    . CARDIOVERSION  N/A 08/13/2018   Procedure: CARDIOVERSION;  Surgeon: Larey Dresser, MD;  Location: [facility] ENDOSCOPY;  Service: Cardiovascular;  Laterality: N/A;  . CARDIOVERSION N/A 09/02/2018   Procedure: CARDIOVERSION;  Surgeon: Thayer Headings, MD;  Location: [place];  Service: Cardiovascular;  Laterality: N/A;  . CARPAL TUNNEL RELEASE     bil  . COLON SURGERY     2004 for colon rupture  . HERNIA REPAIR    . LAPAROSCOPIC GASTRIC BANDING    . LEFT HEART CATH AND CORONARY ANGIOGRAPHY N/A 07/21/2018   Procedure: LEFT HEART CATH AND CORONARY ANGIOGRAPHY;  Surgeon: Belva Crome, MD;  Location: [place] CV LAB;  Service: Cardiovascular;  Laterality: N/A;  . MANDIBLE FRACTURE SURGERY     x 2  . PAROTIDECTOMY Right 11/19/2018   Procedure: PAROTIDECTOMY;  Surgeon: Melida Quitter, MD;  Location: [place];  Service: ENT;  Laterality: Right;  . TONSILLECTOMY      MEDICATIONS: . atorvastatin (LIPITOR) 10 MG tablet  . B-D UF III MINI PEN NEEDLES 31G X 5 MM MISC  . carvedilol (COREG) 25 MG tablet  . dofetilide (TIKOSYN) 500 MCG capsule  . ELIQUIS 5 MG TABS tablet  . ENTRESTO 49-51 MG  . glimepiride (AMARYL) 2 MG  tablet  . glucose blood test strip  . HYDROcodone-acetaminophen (NORCO) 10-325 MG tablet  . liraglutide (VICTOZA) 18 MG/3ML SOPN  . magnesium gluconate (MAGONATE) 500 MG tablet  . metFORMIN (GLUCOPHAGE-XR) 500 MG 24 hr tablet  . Multiple Vitamin (MULTIVITAMIN WITH MINERALS) TABS tablet  . Multiple Vitamins-Minerals (SYSTANE ICAPS AREDS2) TABS  . spironolactone (ALDACTONE) 25 MG tablet  . topiramate (TOPAMAX) 25 MG tablet   No current facility-administered medications for this encounter.    Maia Plan [facility] Pre-Surgical Testing [phone] 04/02/20  12:07 PM

## 2020-04-04 ENCOUNTER — Other Ambulatory Visit: Payer: Self-pay

## 2020-04-04 ENCOUNTER — Other Ambulatory Visit: Payer: Self-pay | Admitting: Adult Health

## 2020-04-04 ENCOUNTER — Encounter (HOSPITAL_COMMUNITY): Payer: Self-pay | Admitting: Urology

## 2020-04-04 ENCOUNTER — Ambulatory Visit (HOSPITAL_COMMUNITY): Payer: PRIVATE HEALTH INSURANCE | Admitting: Certified Registered Nurse Anesthetist

## 2020-04-04 ENCOUNTER — Observation Stay (HOSPITAL_COMMUNITY)
Admission: RE | Admit: 2020-04-04 | Discharge: 2020-04-06 | Disposition: A | Discharge status: Home / Self Care | Payer: PRIVATE HEALTH INSURANCE | Source: Ambulatory Visit | Attending: Urology | Admitting: Urology

## 2020-04-04 ENCOUNTER — Encounter (HOSPITAL_COMMUNITY)
Admission: RE | Disposition: A | Discharge status: Home / Self Care | Payer: Self-pay | Source: Ambulatory Visit | Attending: Urology

## 2020-04-04 ENCOUNTER — Ambulatory Visit (HOSPITAL_COMMUNITY): Payer: PRIVATE HEALTH INSURANCE | Admitting: Physician Assistant

## 2020-04-04 DIAGNOSIS — Z85818 Personal history of malignant neoplasm of other sites of lip, oral cavity, and pharynx: Secondary | ICD-10-CM | POA: Insufficient documentation

## 2020-04-04 DIAGNOSIS — I351 Nonrheumatic aortic (valve) insufficiency: Secondary | ICD-10-CM | POA: Insufficient documentation

## 2020-04-04 DIAGNOSIS — Z885 Allergy status to narcotic agent status: Secondary | ICD-10-CM | POA: Diagnosis not present

## 2020-04-04 DIAGNOSIS — I4819 Other persistent atrial fibrillation: Secondary | ICD-10-CM

## 2020-04-04 DIAGNOSIS — Z79899 Other long term (current) drug therapy: Secondary | ICD-10-CM | POA: Insufficient documentation

## 2020-04-04 DIAGNOSIS — E1122 Type 2 diabetes mellitus with diabetic chronic kidney disease: Secondary | ICD-10-CM | POA: Insufficient documentation

## 2020-04-04 DIAGNOSIS — Z7984 Long term (current) use of oral hypoglycemic drugs: Secondary | ICD-10-CM | POA: Diagnosis not present

## 2020-04-04 DIAGNOSIS — Z9884 Bariatric surgery status: Secondary | ICD-10-CM | POA: Insufficient documentation

## 2020-04-04 DIAGNOSIS — Z6833 Body mass index (BMI) 33.0-33.9, adult: Secondary | ICD-10-CM | POA: Insufficient documentation

## 2020-04-04 DIAGNOSIS — I4891 Unspecified atrial fibrillation: Secondary | ICD-10-CM | POA: Insufficient documentation

## 2020-04-04 DIAGNOSIS — N2889 Other specified disorders of kidney and ureter: Secondary | ICD-10-CM | POA: Diagnosis present

## 2020-04-04 DIAGNOSIS — Z85828 Personal history of other malignant neoplasm of skin: Secondary | ICD-10-CM | POA: Diagnosis not present

## 2020-04-04 DIAGNOSIS — C641 Malignant neoplasm of right kidney, except renal pelvis: Principal | ICD-10-CM | POA: Insufficient documentation

## 2020-04-04 DIAGNOSIS — Z880 Allergy status to penicillin: Secondary | ICD-10-CM | POA: Diagnosis not present

## 2020-04-04 DIAGNOSIS — Z833 Family history of diabetes mellitus: Secondary | ICD-10-CM | POA: Insufficient documentation

## 2020-04-04 DIAGNOSIS — I130 Hypertensive heart and chronic kidney disease with heart failure and stage 1 through stage 4 chronic kidney disease, or unspecified chronic kidney disease: Secondary | ICD-10-CM | POA: Diagnosis not present

## 2020-04-04 DIAGNOSIS — N189 Chronic kidney disease, unspecified: Secondary | ICD-10-CM | POA: Insufficient documentation

## 2020-04-04 DIAGNOSIS — I509 Heart failure, unspecified: Secondary | ICD-10-CM | POA: Diagnosis not present

## 2020-04-04 DIAGNOSIS — I2510 Atherosclerotic heart disease of native coronary artery without angina pectoris: Secondary | ICD-10-CM | POA: Diagnosis not present

## 2020-04-04 DIAGNOSIS — M1990 Unspecified osteoarthritis, unspecified site: Secondary | ICD-10-CM | POA: Diagnosis not present

## 2020-04-04 DIAGNOSIS — G4733 Obstructive sleep apnea (adult) (pediatric): Secondary | ICD-10-CM | POA: Insufficient documentation

## 2020-04-04 DIAGNOSIS — Z87891 Personal history of nicotine dependence: Secondary | ICD-10-CM | POA: Insufficient documentation

## 2020-04-04 DIAGNOSIS — Z7901 Long term (current) use of anticoagulants: Secondary | ICD-10-CM | POA: Diagnosis not present

## 2020-04-04 HISTORY — PX: ROBOTIC ASSITED PARTIAL NEPHRECTOMY: SHX6087

## 2020-04-04 LAB — HEMOGLOBIN AND HEMATOCRIT, BLOOD
HCT: 38.5 % — ABNORMAL LOW (ref 39.0–52.0)
Hemoglobin: 12.3 g/dL — ABNORMAL LOW (ref 13.0–17.0)

## 2020-04-04 LAB — GLUCOSE, CAPILLARY
Glucose-Capillary: 102 mg/dL — ABNORMAL HIGH (ref 70–99)
Glucose-Capillary: 116 mg/dL — ABNORMAL HIGH (ref 70–99)
Glucose-Capillary: 172 mg/dL — ABNORMAL HIGH (ref 70–99)
Glucose-Capillary: 147 mg/dL — ABNORMAL HIGH (ref 70–99)
Glucose-Capillary: 139 mg/dL — ABNORMAL HIGH (ref 70–99)

## 2020-04-04 LAB — ABO/RH: ABO/RH(D): O NEG

## 2020-04-04 LAB — TYPE AND SCREEN
ABO/RH(D): O NEG
Antibody Screen: NEGATIVE

## 2020-04-04 SURGERY — NEPHRECTOMY, PARTIAL, ROBOT-ASSISTED
Anesthesia: General | Site: Abdomen | Laterality: Right

## 2020-04-04 MED ORDER — SUGAMMADEX SODIUM 500 MG/5ML IV SOLN
INTRAVENOUS | Status: AC
  Filled 2020-04-04: qty 5

## 2020-04-04 MED ORDER — INSULIN ASPART 100 UNIT/ML ~~LOC~~ SOLN
0.0000 [IU] | Freq: Three times a day (TID) | SUBCUTANEOUS | Status: DC
  Administered 2020-04-04 – 2020-04-05 (×2): 2 [IU] via SUBCUTANEOUS
  Administered 2020-04-05: 3 [IU] via SUBCUTANEOUS
  Administered 2020-04-05 – 2020-04-06 (×2): 2 [IU] via SUBCUTANEOUS

## 2020-04-04 MED ORDER — ONDANSETRON HCL 4 MG/2ML IJ SOLN: 4.0000 mg | INTRAMUSCULAR | Status: DC | PRN

## 2020-04-04 MED ORDER — LACTATED RINGERS IV SOLN: INTRAVENOUS | Status: DC | PRN

## 2020-04-04 MED ORDER — FENTANYL CITRATE (PF) 100 MCG/2ML IJ SOLN
25.0000 ug | INTRAMUSCULAR | Status: DC | PRN
  Administered 2020-04-04 (×2): 50 ug via INTRAVENOUS

## 2020-04-04 MED ORDER — ALBUMIN HUMAN 5 % IV SOLN: INTRAVENOUS | Status: DC | PRN

## 2020-04-04 MED ORDER — ROCURONIUM BROMIDE 10 MG/ML (PF) SYRINGE
PREFILLED_SYRINGE | INTRAVENOUS | Status: AC
  Filled 2020-04-04: qty 10

## 2020-04-04 MED ORDER — ACETAMINOPHEN 10 MG/ML IV SOLN: 1000.0000 mg | Freq: Once | INTRAVENOUS | Status: DC | PRN

## 2020-04-04 MED ORDER — FENTANYL CITRATE (PF) 100 MCG/2ML IJ SOLN
INTRAMUSCULAR | Status: AC
  Administered 2020-04-04: 50 ug via INTRAVENOUS
  Filled 2020-04-04: qty 2

## 2020-04-04 MED ORDER — SODIUM CHLORIDE (PF) 0.9 % IJ SOLN
INTRAMUSCULAR | Status: AC
  Filled 2020-04-04: qty 20

## 2020-04-04 MED ORDER — CLINDAMYCIN PHOSPHATE 600 MG/50ML IV SOLN
600.0000 mg | INTRAVENOUS | Status: AC
  Administered 2020-04-04: 600 mg via INTRAVENOUS
  Filled 2020-04-04: qty 50

## 2020-04-04 MED ORDER — BUPIVACAINE LIPOSOME 1.3 % IJ SUSP
20.0000 mL | Freq: Once | INTRAMUSCULAR | Status: AC
  Administered 2020-04-04: 12:00:00 20 mL
  Filled 2020-04-04: qty 20

## 2020-04-04 MED ORDER — CARVEDILOL 12.5 MG PO TABS
12.5000 mg | ORAL_TABLET | Freq: Two times a day (BID) | ORAL | Status: DC
  Administered 2020-04-04 – 2020-04-06 (×4): 12.5 mg via ORAL
  Filled 2020-04-04 (×4): qty 1

## 2020-04-04 MED ORDER — LIDOCAINE 2% (20 MG/ML) 5 ML SYRINGE
INTRAMUSCULAR | Status: AC
  Filled 2020-04-04: qty 5

## 2020-04-04 MED ORDER — ALBUMIN HUMAN 5 % IV SOLN
INTRAVENOUS | Status: AC
  Filled 2020-04-04: qty 250

## 2020-04-04 MED ORDER — PROPOFOL 10 MG/ML IV BOLUS
INTRAVENOUS | Status: DC | PRN
  Administered 2020-04-04: 140 mg via INTRAVENOUS

## 2020-04-04 MED ORDER — OXYBUTYNIN CHLORIDE 5 MG PO TABS: 5.0000 mg | ORAL_TABLET | Freq: Three times a day (TID) | ORAL | Status: DC | PRN

## 2020-04-04 MED ORDER — EPHEDRINE SULFATE 50 MG/ML IJ SOLN
INTRAMUSCULAR | Status: DC | PRN
Start: 2020-04-04 — End: 2020-04-04
  Administered 2020-04-04 (×5): 10 mg via INTRAVENOUS

## 2020-04-04 MED ORDER — FENTANYL CITRATE (PF) 250 MCG/5ML IJ SOLN
INTRAMUSCULAR | Status: DC | PRN
  Administered 2020-04-04: 50 ug via INTRAVENOUS
  Administered 2020-04-04: 100 ug via INTRAVENOUS

## 2020-04-04 MED ORDER — EPHEDRINE 5 MG/ML INJ
INTRAVENOUS | Status: AC
  Filled 2020-04-04: qty 10

## 2020-04-04 MED ORDER — FENTANYL CITRATE (PF) 100 MCG/2ML IJ SOLN
25.0000 ug | INTRAMUSCULAR | Status: DC | PRN
  Administered 2020-04-04: 50 ug via INTRAVENOUS
  Filled 2020-04-04: qty 2

## 2020-04-04 MED ORDER — VASOPRESSIN 20 UNIT/ML IV SOLN
INTRAVENOUS | Status: DC | PRN
  Administered 2020-04-04 (×2): 1 [IU] via INTRAVENOUS
  Administered 2020-04-04: 2 [IU] via INTRAVENOUS

## 2020-04-04 MED ORDER — ROCURONIUM BROMIDE 100 MG/10ML IV SOLN
INTRAVENOUS | Status: DC | PRN
  Administered 2020-04-04: 80 mg via INTRAVENOUS
  Administered 2020-04-04: 20 mg via INTRAVENOUS

## 2020-04-04 MED ORDER — PHENYLEPHRINE 40 MCG/ML (10ML) SYRINGE FOR IV PUSH (FOR BLOOD PRESSURE SUPPORT)
PREFILLED_SYRINGE | INTRAVENOUS | Status: AC
  Filled 2020-04-04: qty 10

## 2020-04-04 MED ORDER — GLYCOPYRROLATE PF 0.2 MG/ML IJ SOSY
PREFILLED_SYRINGE | INTRAMUSCULAR | Status: AC
  Filled 2020-04-04: qty 1

## 2020-04-04 MED ORDER — STERILE WATER FOR IRRIGATION IR SOLN
Status: DC | PRN
  Administered 2020-04-04: 1000 mL

## 2020-04-04 MED ORDER — PHENYLEPHRINE HCL (PRESSORS) 10 MG/ML IV SOLN
INTRAVENOUS | Status: DC | PRN
  Administered 2020-04-04 (×2): 80 ug via INTRAVENOUS

## 2020-04-04 MED ORDER — HYDROCODONE-ACETAMINOPHEN 7.5-325 MG PO TABS: 1.0000 | ORAL_TABLET | Freq: Once | ORAL | Status: DC | PRN

## 2020-04-04 MED ORDER — BACITRACIN-NEOMYCIN-POLYMYXIN 400-5-5000 EX OINT: 1.0000 "application " | TOPICAL_OINTMENT | Freq: Three times a day (TID) | CUTANEOUS | Status: DC | PRN

## 2020-04-04 MED ORDER — ACETAMINOPHEN 500 MG PO TABS: 1000.0000 mg | ORAL_TABLET | Freq: Once | ORAL | Status: DC | PRN

## 2020-04-04 MED ORDER — ACETAMINOPHEN 160 MG/5ML PO SOLN: 1000.0000 mg | Freq: Once | ORAL | Status: DC | PRN

## 2020-04-04 MED ORDER — MAGNESIUM GLUCONATE 500 MG PO TABS
500.0000 mg | ORAL_TABLET | Freq: Two times a day (BID) | ORAL | Status: DC
  Administered 2020-04-04 – 2020-04-06 (×4): 500 mg via ORAL
  Filled 2020-04-04 (×4): qty 1

## 2020-04-04 MED ORDER — SODIUM CHLORIDE 0.45 % IV SOLN: INTRAVENOUS | Status: DC

## 2020-04-04 MED ORDER — HYDROCODONE-ACETAMINOPHEN 5-325 MG PO TABS
1.0000 | ORAL_TABLET | ORAL | Status: DC | PRN
  Administered 2020-04-04 – 2020-04-05 (×3): 1 via ORAL
  Administered 2020-04-05: 2 via ORAL
  Filled 2020-04-04 (×2): qty 1
  Filled 2020-04-04: qty 2
  Filled 2020-04-04: qty 1

## 2020-04-04 MED ORDER — DOCUSATE SODIUM 100 MG PO CAPS
100.0000 mg | ORAL_CAPSULE | Freq: Two times a day (BID) | ORAL | Status: DC
  Administered 2020-04-04 – 2020-04-06 (×4): 100 mg via ORAL
  Filled 2020-04-04 (×4): qty 1

## 2020-04-04 MED ORDER — LACTATED RINGERS IV SOLN: INTRAVENOUS | Status: DC

## 2020-04-04 MED ORDER — VASOPRESSIN 20 UNIT/ML IV SOLN
INTRAVENOUS | Status: AC
  Filled 2020-04-04: qty 1

## 2020-04-04 MED ORDER — FENTANYL CITRATE (PF) 250 MCG/5ML IJ SOLN
INTRAMUSCULAR | Status: AC
  Filled 2020-04-04: qty 5

## 2020-04-04 MED ORDER — CIPROFLOXACIN IN D5W 400 MG/200ML IV SOLN
400.0000 mg | INTRAVENOUS | Status: AC
  Administered 2020-04-04: 400 mg via INTRAVENOUS
  Filled 2020-04-04: qty 200

## 2020-04-04 MED ORDER — MIDAZOLAM HCL 5 MG/5ML IJ SOLN
INTRAMUSCULAR | Status: DC | PRN
  Administered 2020-04-04: 2 mg via INTRAVENOUS

## 2020-04-04 MED ORDER — DOFETILIDE 250 MCG PO CAPS
500.0000 ug | ORAL_CAPSULE | Freq: Two times a day (BID) | ORAL | Status: DC
  Administered 2020-04-04 – 2020-04-06 (×4): 500 ug via ORAL
  Filled 2020-04-04 (×5): qty 2

## 2020-04-04 MED ORDER — MIDAZOLAM HCL 2 MG/2ML IJ SOLN
INTRAMUSCULAR | Status: AC
  Filled 2020-04-04: qty 2

## 2020-04-04 MED ORDER — ATORVASTATIN CALCIUM 10 MG PO TABS
10.0000 mg | ORAL_TABLET | Freq: Every day | ORAL | Status: DC
  Administered 2020-04-04 – 2020-04-05 (×2): 10 mg via ORAL
  Filled 2020-04-04 (×2): qty 1

## 2020-04-04 MED ORDER — DIPHENHYDRAMINE HCL 12.5 MG/5ML PO ELIX: 12.5000 mg | ORAL_SOLUTION | Freq: Four times a day (QID) | ORAL | Status: DC | PRN

## 2020-04-04 MED ORDER — LIDOCAINE HCL (CARDIAC) PF 100 MG/5ML IV SOSY
PREFILLED_SYRINGE | INTRAVENOUS | Status: DC | PRN
  Administered 2020-04-04: 100 mg via INTRAVENOUS

## 2020-04-04 MED ORDER — ACETAMINOPHEN 500 MG PO TABS
1000.0000 mg | ORAL_TABLET | Freq: Four times a day (QID) | ORAL | Status: AC
  Administered 2020-04-04 – 2020-04-05 (×3): 1000 mg via ORAL
  Filled 2020-04-04 (×3): qty 2

## 2020-04-04 MED ORDER — CALCIUM CHLORIDE 10 % IV SOLN
INTRAVENOUS | Status: AC
  Filled 2020-04-04: qty 10

## 2020-04-04 MED ORDER — LACTATED RINGERS IR SOLN
Status: DC | PRN
  Administered 2020-04-04: 1

## 2020-04-04 MED ORDER — MAGNESIUM CITRATE PO SOLN: 1.0000 | Freq: Once | ORAL | Status: DC

## 2020-04-04 MED ORDER — GLYCOPYRROLATE 0.2 MG/ML IJ SOLN
INTRAMUSCULAR | Status: DC | PRN
  Administered 2020-04-04: .2 mg via INTRAVENOUS

## 2020-04-04 MED ORDER — TOPIRAMATE 25 MG PO TABS
25.0000 mg | ORAL_TABLET | Freq: Two times a day (BID) | ORAL | Status: DC
  Administered 2020-04-04 – 2020-04-06 (×4): 25 mg via ORAL
  Filled 2020-04-04 (×4): qty 1

## 2020-04-04 MED ORDER — SUGAMMADEX SODIUM 500 MG/5ML IV SOLN
INTRAVENOUS | Status: DC | PRN
Start: 2020-04-04 — End: 2020-04-04
  Administered 2020-04-04: 250 mg via INTRAVENOUS

## 2020-04-04 MED ORDER — PROPOFOL 10 MG/ML IV BOLUS
INTRAVENOUS | Status: AC
  Filled 2020-04-04: qty 20

## 2020-04-04 MED ORDER — FENTANYL CITRATE (PF) 100 MCG/2ML IJ SOLN
INTRAMUSCULAR | Status: AC
  Filled 2020-04-04: qty 2

## 2020-04-04 MED ORDER — DIPHENHYDRAMINE HCL 50 MG/ML IJ SOLN: 12.5000 mg | Freq: Four times a day (QID) | INTRAMUSCULAR | Status: DC | PRN

## 2020-04-04 SURGICAL SUPPLY — 82 items
ADH SKN CLS APL DERMABOND .7 (GAUZE/BANDAGES/DRESSINGS) ×1
AGENT HMST KT MTR STRL THRMB (HEMOSTASIS) ×1
AGENT HMST MTR 8 SURGIFLO (HEMOSTASIS) ×1
APL ESCP 34 STRL LF DISP (HEMOSTASIS) ×1
APL PRP STRL LF DISP 70% ISPRP (MISCELLANEOUS) ×1
APPLICATOR SURGIFLO ENDO (HEMOSTASIS) ×2 IMPLANT
BAG SPEC RTRVL LRG 6X4 10 (ENDOMECHANICALS) ×2
CHLORAPREP W/TINT 26 (MISCELLANEOUS) ×2 IMPLANT
CLIP SUT LAPRA TY ABSORB (SUTURE) ×4 IMPLANT
CLIP VESOLOCK LG 6/CT PURPLE (CLIP) ×2 IMPLANT
CLIP VESOLOCK MED LG 6/CT (CLIP) ×6 IMPLANT
CLIP VESOLOCK XL 6/CT (CLIP) ×2 IMPLANT
COVER SURGICAL LIGHT HANDLE (MISCELLANEOUS) ×2 IMPLANT
COVER TIP SHEARS 8 DVNC (MISCELLANEOUS) ×1 IMPLANT
COVER TIP SHEARS 8MM DA VINCI (MISCELLANEOUS) ×2
COVER WAND RF STERILE (DRAPES) IMPLANT
DECANTER SPIKE VIAL GLASS SM (MISCELLANEOUS) ×2 IMPLANT
DERMABOND ADVANCED (GAUZE/BANDAGES/DRESSINGS) ×1
DERMABOND ADVANCED .7 DNX12 (GAUZE/BANDAGES/DRESSINGS) ×1 IMPLANT
DISSECT BALLN SPACEMKR + OVL (BALLOONS) ×2
DISSECTOR BALLN SPACEMKR + OVL (BALLOONS) IMPLANT
DRAIN CHANNEL 15F RND FF 3/16 (WOUND CARE) ×2 IMPLANT
DRAPE ARM DVNC X/XI (DISPOSABLE) ×4 IMPLANT
DRAPE COLUMN DVNC XI (DISPOSABLE) ×1 IMPLANT
DRAPE DA VINCI XI ARM (DISPOSABLE) ×8
DRAPE DA VINCI XI COLUMN (DISPOSABLE) ×2
DRAPE INCISE IOBAN 66X45 STRL (DRAPES) ×2 IMPLANT
DRAPE SHEET LG 3/4 BI-LAMINATE (DRAPES) ×2 IMPLANT
DRSG TEGADERM 4X4.75 (GAUZE/BANDAGES/DRESSINGS) ×1 IMPLANT
ELECT PENCIL ROCKER SW 15FT (MISCELLANEOUS) ×2 IMPLANT
ELECT REM PT RETURN 15FT ADLT (MISCELLANEOUS) ×2 IMPLANT
EVACUATOR SILICONE 100CC (DRAIN) ×2 IMPLANT
GAUZE SPONGE 2X2 8PLY STRL LF (GAUZE/BANDAGES/DRESSINGS) IMPLANT
GLOVE BIO SURGEON STRL SZ 6.5 (GLOVE) ×2 IMPLANT
GLOVE BIOGEL M STRL SZ7.5 (GLOVE) ×4 IMPLANT
GOWN STRL REUS W/TWL LRG LVL3 (GOWN DISPOSABLE) ×4 IMPLANT
HEMOSTAT SURGICEL 4X8 (HEMOSTASIS) ×3 IMPLANT
IRRIG SUCT STRYKERFLOW 2 WTIP (MISCELLANEOUS) ×2
IRRIGATION SUCT STRKRFLW 2 WTP (MISCELLANEOUS) ×1 IMPLANT
KIT BASIN (CUSTOM PROCEDURE TRAY) ×2 IMPLANT
KIT TURNOVER KIT A (KITS) IMPLANT
LOOP VESSEL MAXI BLUE (MISCELLANEOUS) ×2 IMPLANT
MARKER SKIN DUAL TIP RULER LAB (MISCELLANEOUS) ×2 IMPLANT
NDL INSUFFLATION 14GA 120MM (NEEDLE) ×1 IMPLANT
NEEDLE INSUFFLATION 14GA 120MM (NEEDLE) ×2 IMPLANT
NS IRRIG 1000ML POUR BTL (IV SOLUTION) ×2 IMPLANT
PENCIL SMOKE EVACUATOR (MISCELLANEOUS) IMPLANT
PORT ACCESS TROCAR AIRSEAL 12 (TROCAR) ×1 IMPLANT
PORT ACCESS TROCAR AIRSEAL 5M (TROCAR) ×1
POUCH SPECIMEN RETRIEVAL 10MM (ENDOMECHANICALS) ×3 IMPLANT
PROTECTOR NERVE ULNAR (MISCELLANEOUS) ×4 IMPLANT
RELOAD STAPLE 60 2.6 WHT THN (STAPLE) IMPLANT
RELOAD STAPLER WHITE 60MM (STAPLE) IMPLANT
SEAL CANN UNIV 5-8 DVNC XI (MISCELLANEOUS) ×4 IMPLANT
SEAL XI 5MM-8MM UNIVERSAL (MISCELLANEOUS) ×8
SET TRI-LUMEN FLTR TB AIRSEAL (TUBING) ×2 IMPLANT
SOLUTION ELECTROLUBE (MISCELLANEOUS) ×2 IMPLANT
SPOGE SURGIFLO 8M (HEMOSTASIS) ×2
SPONGE GAUZE 2X2 STER 10/PKG (GAUZE/BANDAGES/DRESSINGS) ×1
SPONGE LAP 4X18 RFD (DISPOSABLE) ×2 IMPLANT
SPONGE SURGIFLO 8M (HEMOSTASIS) IMPLANT
STAPLE ECHEON FLEX 60 POW ENDO (STAPLE) IMPLANT
STAPLER RELOAD WHITE 60MM (STAPLE)
SURGIFLO W/THROMBIN 8M KIT (HEMOSTASIS) ×2 IMPLANT
SUT ETHILON 3 0 PS 1 (SUTURE) ×2 IMPLANT
SUT MNCRL AB 4-0 PS2 18 (SUTURE) ×4 IMPLANT
SUT PDS AB 1 CT1 27 (SUTURE) ×4 IMPLANT
SUT V-LOC BARB 180 2/0GR6 GS22 (SUTURE)
SUT VIC AB 0 CT1 27 (SUTURE) ×8
SUT VIC AB 0 CT1 27XBRD ANTBC (SUTURE) ×4 IMPLANT
SUT VIC AB 2-0 SH 27 (SUTURE) ×4
SUT VIC AB 2-0 SH 27X BRD (SUTURE) ×2 IMPLANT
SUT VLOC BARB 180 ABS3/0GR12 (SUTURE) ×2
SUTURE V-LC BRB 180 2/0GR6GS22 (SUTURE) IMPLANT
SUTURE VLOC BRB 180 ABS3/0GR12 (SUTURE) ×1 IMPLANT
TOWEL OR 17X26 10 PK STRL BLUE (TOWEL DISPOSABLE) ×4 IMPLANT
TOWEL OR NON WOVEN STRL DISP B (DISPOSABLE) ×2 IMPLANT
TRAY FOLEY MTR SLVR 16FR STAT (SET/KITS/TRAYS/PACK) ×2 IMPLANT
TRAY LAPAROSCOPIC (CUSTOM PROCEDURE TRAY) ×2 IMPLANT
TROCAR BLADELESS OPT 5 100 (ENDOMECHANICALS) IMPLANT
TROCAR XCEL 12X100 BLDLESS (ENDOMECHANICALS) ×2 IMPLANT
WATER STERILE IRR 1000ML POUR (IV SOLUTION) ×4 IMPLANT

## 2020-04-04 NOTE — Anesthesia Postprocedure Evaluation (Signed)
Anesthesia Post Note  Patient: Casey [name].  Procedure(s) Performed: XI ROBOTIC [place] (Right Abdomen)     Patient location during evaluation: PACU Anesthesia Type: General Level of consciousness: awake and alert Pain management: pain level controlled Vital Signs Assessment: post-procedure vital signs reviewed and stable Respiratory status: spontaneous breathing, nonlabored ventilation, respiratory function stable and patient connected to nasal cannula oxygen Cardiovascular status: blood pressure returned to baseline and stable Postop Assessment: no apparent nausea or vomiting Anesthetic complications: no    Last Vitals:  Vitals:   04/04/20 1315 04/04/20 1337  BP:  119/70  Pulse: (!) 57 (!) 59  Resp: 14 18  Temp:  36.4 C  SpO2: 98% 98%    Last Pain:  Vitals:   04/04/20 1337  TempSrc: Oral  PainSc:                  Severiano Utsey

## 2020-04-04 NOTE — H&P (Signed)
Casey [name]. is an 64 y.o. male.    Chief Complaint: Pre-OP RIGHT Retroperitoneal Partial v. Radical Nephrectomy  HPI:   1 - Right Renal Masses - solid and enhancing 3.0 cm right renal mass with features concerning for RCC on CT with contrast from 02/20/20. He was also noted to have an additional 1 cm right renal lesion that appears to be cystic in nature. Cr <1. He is diabetic. Dedicated MRI 03/2020 confirms Rt mid lateral 3.6cm mass, 50% exophytic with enhancmenet and 1.2 cm posterior mid mass (inferior to larger mass) with enhancement. 1 artery (early branch) / 1 vein right renovascular anatomy. Lt side unremarkable. Preserved retroperitoneal window.   PMHx: A-fib (on eliquis), DM2 (A1c 7s) CHF, OSA, obesity, previous smoker, lumbar stenosis, parotid cancer and chronic back pain  PSHx: Ex-lap from perforated diverticulitis with subsequent colostomy reversal (Rt sided) in 2004, multiple back surgeries, [facility] parotidectomy  Cardiologist- Dr. Daneen Schick  PCP - Debera Lat MD with [facility]   Today "Casey [name]" is seen to proceed with RIGHT retroperitoneal robotic partial v. Radical nephrectomy.    Past Medical History:  Diagnosis Date  . Arthritis   . Atrial fibrillation ([place]) 07/2018  . Cancer ([place])    skin Ca on nose,kidney  . Chronic kidney disease   . Complication of anesthesia    diff. waking up  . Diabetes mellitus   . Dysrhythmia 07/2018   A-Fib  . Headache(784.0)   . Hypertension    dr Marisue Humble   pcp  . Sleep apnea    prior to weight lose surgery-Dr. Don Perking not use CPAP    Past Surgical History:  Procedure Laterality Date  . APPENDECTOMY    . BACK SURGERY     5  back surgeries  . CARDIAC CATHETERIZATION    . CARDIOVERSION N/A 08/13/2018   Procedure: CARDIOVERSION;  Surgeon: Larey Dresser, MD;  Location: [facility] END[facility]COPY;  Service: Cardiovascular;  Laterality: N/A;  . CARDIOVERSION N/A 09/02/2018   Procedure: CARDIOVERSION;   Surgeon: Thayer Headings, MD;  Location: [place];  Service: Cardiovascular;  Laterality: N/A;  . CARPAL TUNNEL RELEASE     bil  . COLON SURGERY     2004 for colon rupture  . HERNIA REPAIR    . LAPAROSCOPIC GASTRIC BANDING    . LEFT HEART CATH AND CORONARY ANGIOGRAPHY N/A 07/21/2018   Procedure: LEFT HEART CATH AND CORONARY ANGIOGRAPHY;  Surgeon: Belva Crome, MD;  Location: [place] CV LAB;  Service: Cardiovascular;  Laterality: N/A;  . MANDIBLE FRACTURE SURGERY     x 2  . PAROTIDECTOMY Right 11/19/2018   Procedure: PAROTIDECTOMY;  Surgeon: Melida Quitter, MD;  Location: [place];  Service: ENT;  Laterality: Right;  . TONSILLECTOMY      Family History  Problem Relation Age of Onset  . Diabetes Maternal Grandfather   . Heart disease Neg Hx    Social History:  reports that he quit smoking about 14 years ago. His smoking use included cigarettes. He has a 13.50 pack-year smoking history. He has never used smokeless tobacco. He reports that he does not drink alcohol or use drugs.  Allergies:  Allergies  Allergen Reactions  . Penicillins Hives    Has patient had a PCN reaction causing immediate rash, facial/tongue/throat swelling, SOB or lightheadedness with hypotension: Unknown Has patient had a PCN reaction causing severe rash involving mucus membranes or skin necrosis: Unknown Has patient had a PCN reaction that  required hospitalization: No Has patient had a PCN reaction occurring within the last 10 years: No Childhood reaction. If all of the above answers are "NO", then may proceed with Cephalosporin use.   [name] Percocet [Oxycodone-Acetaminophen] Anxiety and Other (See Comments)    Hyperactivity & unhinged. Previously tolerated hydrocodone    Medications Prior to Admission  Medication Sig Dispense Refill  . atorvastatin (LIPITOR) 10 MG tablet Take 10 mg by mouth at bedtime.   1  . carvedilol (COREG) 25 MG tablet TAKE 1/2 TABLET (12.5MG ) BY MOUTH TWICE DAILY (Patient taking  differently: Take 12.5 mg by mouth in the morning and at bedtime. ) 90 tablet 2  . dofetilide (TIKOSYN) 500 MCG capsule TAKE 1 CAPSULE (500 MCG TOTAL) BY MOUTH 2 (TWO) TIMES DAILY. 180 capsule 2  . ELIQUIS 5 MG TABS tablet TAKE 1 TABLET BY MOUTH TWICE A DAY (Patient taking differently: Take 5 mg by mouth 2 (two) times daily. ) 60 tablet 6  . ENTRESTO 49-51 MG TAKE 1 TABLET BY MOUTH TWICE A DAY (Patient taking differently: Take 1 tablet by mouth 2 (two) times daily. ) 180 tablet 2  . glimepiride (AMARYL) 2 MG tablet Take 2 mg by mouth daily with breakfast.    . HYDROcodone-acetaminophen (NORCO) 10-325 MG tablet Take 1 tablet by mouth every 4 (four) hours as needed (for severe back pain.).    [name] liraglutide (VICTOZA) 18 MG/3ML SOPN Inject 1.8 mg into the skin every evening.     . magnesium gluconate (MAGONATE) 500 MG tablet Take 500 mg by mouth 2 (two) times daily.    . metFORMIN (GLUCOPHAGE-XR) 500 MG 24 hr tablet Take 1,000 mg by mouth 2 (two) times daily.  1  . Multiple Vitamin (MULTIVITAMIN WITH MINERALS) TABS tablet Take 1 tablet by mouth daily.    . Multiple Vitamins-Minerals (SYSTANE ICAPS AREDS2) TABS Take 1 tablet by mouth daily.    [name] spironolactone (ALDACTONE) 25 MG tablet TAKE 1/2 TABLET BY MOUTH EVERY DAY (Patient taking differently: Take 12.5 mg by mouth daily. ) 45 tablet 2  . topiramate (TOPAMAX) 25 MG tablet Take 25 mg by mouth 2 (two) times daily.     . B-D UF III MINI PEN NEEDLES 31G X 5 MM MISC USE WITH VICTOZA AS DIRECTED ONCE A DAY    . glucose blood test strip USE TO TEST BLOOD SUGAR THREE TIMES A DAY      No results found for this or any previous visit (from the past 12 hour(s)). No results found.  Review of Systems  Constitutional: Negative.   HENT: Negative.   Eyes: Negative.   Respiratory: Negative.   Cardiovascular: Negative.   Gastrointestinal: Negative.   Endocrine: Negative.   Genitourinary: Negative.   Musculoskeletal: Negative.   Allergic/Immunologic:  Negative.   Neurological: Negative.   Psychiatric/Behavioral: Negative.     Blood pressure (!) 141/87, pulse (!) 53, temperature 98.3 F (36.8 C), temperature source Oral, resp. rate 18, height 5\' 10"  (1.778 m), weight 106.2 kg, SpO2 99 %. Physical Exam  Constitutional: He appears well-developed.  HENT:  Head: Normocephalic.  Eyes: Pupils are equal, round, and reactive to light.  Cardiovascular: Normal rate.  Respiratory: Effort normal.  GI:  Multiple scars w/o hernias.   Musculoskeletal:        General: Normal range of motion.     Cervical back: Normal range of motion.  Neurological: He is alert.  Skin: Skin is warm.  Psychiatric: He has a normal mood and affect.  Assessment/Plan  Proceed as planned with RIGHT robotic retroperitoneal partial versus radical nephrectomy.   Alexis Frock, MD 04/04/2020, 8:02 AM

## 2020-04-04 NOTE — Plan of Care (Signed)
  Problem: Pain Managment: Goal: General experience of comfort will improve Outcome: Progressing   

## 2020-04-04 NOTE — Progress Notes (Signed)
On call notified that patient had order to resume Tikosyn this evening that is one of his daily home medications, however the order specified that his QTc needed calculation prior to adminstration and patient was not currently on telemetry. Order given to place patient and telemetry for administration of Tikosyn. Admitting provider may d/c in the am if warranted.

## 2020-04-04 NOTE — Brief Op Note (Signed)
04/04/2020  11:47 AM  PATIENT:  [name]  64 y.o. male  PRE-OPERATIVE DIAGNOSIS:  RIGHT RENAL MASSES  POST-OPERATIVE DIAGNOSIS:  RIGHT RENAL MASSES  PROCEDURE:  Procedure(s) with comments: XI ROBOTIC ASSITED PARTIAL NEPHRECTOMYINTRAOPATIVE ULTRASOUND (Right) - 3.5 HRS  SURGEON:  Surgeon(s) and Role:    Alexis Frock, MD - Primary  PHYSICIAN ASSISTANT:   ASSISTANTS: Clemetine Marker PA   ANESTHESIA:   general  EBL:  50 mL   BLOOD ADMINISTERED:none  DRAINS: JP to bulb, foley to gravity.    LOCAL MEDICATIONS USED:  MARCAINE     SPECIMEN:  Source of Specimen:  1 - renal mass #1; 2 - renal mass # 2, 3 - deep margin of mass #2  DISPOSITION OF SPECIMEN:  PATHOLOGY  COUNTS:  YES  TOURNIQUET:  * No tourniquets in log *  DICTATION: .Other Dictation: Dictation Number  [id number]  PLAN OF CARE: Admit to inpatient   PATIENT DISPOSITION:  PACU - hemodynamically stable.   Delay start of Pharmacological VTE agent (>24hrs) due to surgical blood loss or risk of bleeding: yes

## 2020-04-04 NOTE — Anesthesia Procedure Notes (Signed)
Procedure Name: Intubation Date/Time: 04/04/2020 8:36 AM Performed by: Glory Buff, CRNA Pre-anesthesia Checklist: Patient identified, Emergency Drugs available, Suction available and Patient being monitored Patient Re-evaluated:Patient Re-evaluated prior to induction Oxygen Delivery Method: Circle system utilized Preoxygenation: Pre-oxygenation with 100% oxygen Induction Type: IV induction Ventilation: Mask ventilation without difficulty Laryngoscope Size: Miller and 3 Grade View: Grade I Tube type: Oral Tube size: 7.5 mm Number of attempts: 1 Airway Equipment and Method: Stylet and Oral airway Placement Confirmation: ETT inserted through vocal cords under direct vision,  positive ETCO2 and breath sounds checked- equal and bilateral Secured at: 22 cm Tube secured with: Tape Dental Injury: Teeth and Oropharynx as per pre-operative assessment

## 2020-04-04 NOTE — Progress Notes (Signed)
Urologic Surgery Post-op note  Subjective: The patient is doing well.  No complaints. Sitting up in bed.  Pain well controlled. No nausea.   Objective: Vital signs in last 24 hours: Temp:  [96.3 F (35.7 C)-98.3 F (36.8 C)] 97.6 F (36.4 C) (05/05 1337) Pulse Rate:  [45-62] 59 (05/05 1337) Resp:  [11-18] 18 (05/05 1337) BP: (89-141)/(54-87) 119/70 (05/05 1337) SpO2:  [95 %-100 %] 98 % (05/05 1337) Weight:  [106.2 kg] 106.2 kg (05/05 0716)  Intake/Output from previous day: No intake/output data recorded. Intake/Output this shift: Total I/O In: 2914 [I.V.:1914; IV Piggyback:1000] Out: 435 [Urine:245; Drains:140; Blood:50]  Physical Exam:  General: Alert and oriented. Abdomen: Soft, Nondistended. Right flank appropriately tender GU: Foley in place with clear yellow urine Incisions: Clean and dry. JP with SS output  Lab Results: Recent Labs    04/04/20 1212  HGB 12.3*  HCT 38.5*    Assessment/Plan: POD#0 from robotic retroperitoneal right partial nephrectomy of two renal masses.   PACU Hgb 12.3 from preop 13.8; expected.   -Continue to monitor -CLD -AM labs

## 2020-04-04 NOTE — Transfer of Care (Signed)
Immediate Anesthesia Transfer of Care Note  Patient: Casey [name].  Procedure(s) Performed: XI ROBOTIC ASSITED PARTIAL [place] (Right Abdomen)  Patient Location: PACU  Anesthesia Type:General  Level of Consciousness: drowsy, patient cooperative and responds to stimulation  Airway & Oxygen Therapy: Patient Spontanous Breathing and Patient connected to face mask oxygen  Post-op Assessment: Report given to RN and Post -op Vital signs reviewed and stable  Post vital signs: Reviewed and stable  Last Vitals:  Vitals Value Taken Time  BP 123/71 04/04/20 1203  Temp    Pulse 56 04/04/20 1208  Resp 16 04/04/20 1208  SpO2 97 % 04/04/20 1208  Vitals shown include unvalidated device data.  Last Pain:  Vitals:   04/04/20 0716  TempSrc:   PainSc: 3       Patients Stated Pain Goal: 4 ([id number])  Complications: No apparent anesthesia complications

## 2020-04-04 NOTE — Anesthesia Preprocedure Evaluation (Addendum)
Anesthesia Evaluation  Patient identified by MRN, date of birth, ID band Patient awake    Reviewed: Allergy & Precautions, NPO status , Patient's Chart, lab work & pertinent test results  History of Anesthesia Complications Negative for: history of anesthetic complications  Airway Mallampati: III  TM Distance: >3 FB Neck ROM: Full    Dental  (+) Dental Advisory Given, Teeth Intact   Pulmonary neg shortness of breath, sleep apnea , neg COPD, neg recent URI, former smoker,    breath sounds clear to auscultation       Cardiovascular hypertension, Pt. on medications and Pt. on home beta blockers (-) angina+ CAD  (-) Past MI + dysrhythmias Atrial Fibrillation  Rhythm:Regular  - Left ventricle: The cavity size was normal. Systolic function was  normal. Wall motion was normal; there were no regional wall  motion abnormalities. Doppler parameters are consistent with  abnormal left ventricular relaxation (grade 1 diastolic  dysfunction). Doppler parameters are consistent with  indeterminate ventricular filling pressure.  - Aortic valve: Transvalvular velocity was within the normal range.  There was no stenosis. There was mild regurgitation.  - Aorta: Ascending aortic diameter: 37 mm (S).  - Ascending aorta: The ascending aorta was mildly dilated.  - Mitral valve: Transvalvular velocity was within the normal range.  There was no evidence for stenosis. There was no regurgitation.  - Right ventricle: The cavity size was normal. Wall thickness was  normal. Systolic function was normal.  - Atrial septum: No defect or patent foramen ovale was identified.  - Tricuspid valve: Transvalvular velocity was within the normal  range. There was no regurgitation.  - Pulmonary arteries: Systolic pressure could not be accurately  estimated.  - Pericardium, extracardiac: A trivial pericardial effusion was  identified.  - Global  longitudinal strain -15.1% (mildly abnormal).    Moderate three-vessel coronary artery disease.  60 to 70% mid RCA and 95% stenosis of the distal right coronary beyond the origin of the PDA.  Normal left main  Mid LAD 60 to 70% stenosis beyond the origin of the dominant first diagonal.  Large ramus intermedius with luminal irregularity.  Relatively small distribution circumflex with 70% obstruction in the first marginal and total occlusion of the distal circumflex before the origin of the small second obtuse marginal.  There are faint left to left collaterals to the distal circumflex.  Moderately severe left ventricular dysfunction with global hypokinesis, EF 35%.  Normal LVEDP.     Neuro/Psych  Headaches, neg Seizures Multiple back operations negative psych ROS   GI/Hepatic   Endo/Other  diabetes, Oral Hypoglycemic AgentsMorbid obesity  Renal/GU Renal diseaseRenal mass on right     Musculoskeletal  (+) Arthritis ,   Abdominal   Peds  Hematology eliquis for a fib   Anesthesia Other Findings   Reproductive/Obstetrics                             Anesthesia Physical Anesthesia Plan  ASA: III  Anesthesia Plan: General   Post-op Pain Management:    Induction: Intravenous  PONV Risk Score and Plan: 2 and Ondansetron and Dexamethasone  Airway Management Planned: Oral ETT  Additional Equipment: None  Intra-op Plan:   Post-operative Plan: Extubation in OR  Informed Consent: I have reviewed the patients History and Physical, chart, labs and discussed the procedure including the risks, benefits and alternatives for the proposed anesthesia with the patient or authorized representative who has indicated his/her understanding  and acceptance.     Dental advisory given  Plan Discussed with: CRNA and Surgeon  Anesthesia Plan Comments:         Anesthesia Quick Evaluation

## 2020-04-05 DIAGNOSIS — C641 Malignant neoplasm of right kidney, except renal pelvis: Secondary | ICD-10-CM | POA: Diagnosis not present

## 2020-04-05 LAB — GLUCOSE, CAPILLARY
Glucose-Capillary: 123 mg/dL — ABNORMAL HIGH (ref 70–99)
Glucose-Capillary: 151 mg/dL — ABNORMAL HIGH (ref 70–99)
Glucose-Capillary: 138 mg/dL — ABNORMAL HIGH (ref 70–99)
Glucose-Capillary: 173 mg/dL — ABNORMAL HIGH (ref 70–99)

## 2020-04-05 LAB — BASIC METABOLIC PANEL
Anion gap: 10 (ref 5–15)
BUN: 12 mg/dL (ref 8–23)
CO2: 23 mmol/L (ref 22–32)
Calcium: 8.8 mg/dL — ABNORMAL LOW (ref 8.9–10.3)
Chloride: 105 mmol/L (ref 98–111)
Creatinine, Ser: 1.37 mg/dL — ABNORMAL HIGH (ref 0.61–1.24)
GFR calc Af Amer: >60 mL/min (ref >60)
GFR calc non Af Amer: 55 mL/min — ABNORMAL LOW (ref >60)
Glucose, Bld: 131 mg/dL — ABNORMAL HIGH (ref 70–99)
Potassium: 3.9 mmol/L (ref 3.5–5.1)
Sodium: 138 mmol/L (ref 135–145)

## 2020-04-05 LAB — CREATININE, FLUID (PLEURAL, PERITONEAL, JP DRAINAGE): Creat, Fluid: 1.3 mg/dL

## 2020-04-05 LAB — HEMOGLOBIN AND HEMATOCRIT, BLOOD
HCT: 41.9 % (ref 39.0–52.0)
Hemoglobin: 13.2 g/dL (ref 13.0–17.0)

## 2020-04-05 MED ORDER — CHLORHEXIDINE GLUCONATE CLOTH 2 % EX PADS
6.0000 | MEDICATED_PAD | Freq: Every day | CUTANEOUS | Status: DC
  Administered 2020-04-05: 6 via TOPICAL

## 2020-04-05 MED ORDER — PHENOL 1.4 % MT LIQD
1.0000 | OROMUCOSAL | Status: DC | PRN
  Filled 2020-04-05: qty 177

## 2020-04-05 NOTE — Op Note (Signed)
NAME: Casey [name], [place] MEDICAL RECORD [id number] ACCOUNT [id number] DATE OF BIRTH:07-Jan-1956 FACILITY: [facility] LOCATION: [facility] PHYSICIAN:Venus Gilles, MD  OPERATIVE REPORT  DATE OF PROCEDURE:  04/04/2020  PREOPERATIVE DIAGNOSES:   1.  Multifocal right renal masses.   2.  History of extensive prior surgery.  PROCEDURE: 1.  Right robotic-assisted laparoscopic right partial nephrectomy, retroperitoneal approach. 2.  Intraoperative ultrasound interpretation.  ESTIMATED BLOOD LOSS:  50 mL.  COMPLICATIONS:  None.  SPECIMENS: 1.  Renal mass #1 (larger). 2.  Renal mass #2 (smaller). 3.  Deep margin of mass #2 for permanent pathology.  ASSISTANT:  Bari Mantis, PA  FINDINGS: 1.  Single artery early branching single vein, right renovascular anatomy. 2.  Approximately 50% exophytic larger right suprarenal mass, solid on intraoperative ultrasound. 3.  Smaller solid right inferolateral renal mass.  WARM ISCHEMIA TIME:  30 minutes.  INDICATIONS:  The patient is a very pleasant 64 year old man with extensive surgical history status post prior weight surgery as well as right ascending colon resection for benign disease with colostomy with colostomy takedown.  He was found incidentally  to have multifocal right renal masses concerning for localized cancer.  Options were discussed for management including surveillance protocols versus ablative therapy versus surgical extirpation with or without nephron sparing.  We agreed the optimal  approach given his extensive surgical history would be right robotic partial nephrectomy, retroperitoneal.  He wished to proceed.  Informed consent was then placed in medical record.  DESCRIPTION OF PROCEDURE:  Patient identified as being patient, procedure being right robotic partial nephrectomy was confirmed.  Procedure timeout was performed.  Anesthesia administered.  General endotracheal anesthesia induced.  The patient was placed  in the right side  up full flank position, pulling 15 degrees of table flexion, superior arm elevator, axillary roll, sequential compression restraints, bottom leg bent, top leg straight.  Foley catheter was placed free to straight drain.  Sterile field  was created by prepping and draping after clipper shaving the patient's right flank and abdomen from his spine all the way anterior to his abdominal midline.  Attention was directed at retroperitoneal access.  Incision was made approximately 15 mm in  length.  It was positioned approximately 1 cm inferior, 3 cm posterior to the tip of the 12th rib.  Using surgeon's finger dissection was performed through subcutaneous fatty tissue to the lumbodorsal fascia.  Piercing it and entering the retroperitoneal  space.  This was corroborated by palpation of the psoas musculature and lower pole of the kidney.  Surgeon's finger was then used to further dissect the retroperitoneal space as fine as possible sweeping the peritoneal edge off of the anterior surface.   Next, the ovoid shaped retroperitoneal Spacemaker balloon dilation apparatus was carefully placed into the retroperitoneal space and using laparoscopic vision this was carefully inflated to a pressure of 60 pumps.  Keeping the psoas at the horizontal  floor ensuring to form the space on the posterior aspect of the kidney.  Spacemaker balloon was then taken down and again using surgeon's finger, distal ports were placed as follows:  Right posterior inferior 12 mm assistant port site AirSeal type just  lateral to the psoas musculature, left superolateral from right superolateral robotic port site in the acute angle of the 12th rib and psoas musculature, 12 mm far medial robotic port site keeping under the plane of the trajectory of the 12th rib, and a  12 mm superior and medial robotic port and positioned approximately 2 cm superior and  medial to the 12th rib.  Robot was docked and passed the electronic checks.  Initial attention  was directed at identification of renal vessels.  The kidney was placed  on gentle anterior traction and using the psoas muscle as a floor dissection proceeded medial along the psoas musculature towards the area of the great vessels.  The inferior vena cava was encountered as was the right renal artery.  Right renal artery  was early branching as anticipated with a single branch that was circumferentially mobilized, marked with vessel loop.  The renal vein was also circumferentially mobilized, marked a vessel loop.  Having obtained our access attention was directed to  identification of the 2 solid renal masses of interest.  Dissection proceeded directly onto the renal parenchyma on the posterior to the kidney and immediately the smaller inferior mass was identified.  There were obvious visible cues to its margins and  this was scored circumferentially.  The incision then proceeded superior and lateral to this and the area of the superior mass was identified.  This mass was much more diffuse in its interface with the kidney and to help guide resection intraoperative  ultrasound was performed using drop-in probe.  Intraoperative ultrasound using drop-in probe revealed solid mass approximately 4 cm as anticipated in the lateral aspect of the kidney very close to the hilar axis.  Using a combination of gross visual cues and ultrasound guidance, the presumed plane  for partial nephrectomy was scored circumferentially around this mass.  Next, more ischemia was achieved by placing 2 bulldog clamps on the artery alone at the common trunk and partial nephrectomy was performed of the larger superior mass  circumferentially, keeping what appeared to be a rim of normal renal parenchyma with the partial nephrectomy specimen.  This was then set aside.  There were several small arterials which were controlled using coagulation current cautery.  Notably, there  was no obvious collecting system entrance at this point.   First layer renorrhaphy was performed using running 3-0 V-Loc suture oversewing several small venous sinuses.  Next, the bolster of Surgicel was applied along the axis of the renorrhaphy and then  4 interrupted sutures of 0 Vicryl sandwiched between Hem-o-loks and Lapra-Tys were used for parenchymal apposition sutures, which resulted in excellent parenchymal apposition.  Attention was directed at partial nephrectomy of the secondary inferior mass.   Using cold scissors the previously scored area was removed.  Notably, this inferior mass was somewhat deeper than anticipated and the deepest aspect was controlled using an enucleation fashion and then fulgurated.  A separate final margin was sent from  this area.  Renorrhaphy here 1was performed using a 1/4 size Surgicel bolster into parenchymal apposition sutures of 0 Vicryl resulted in excellent parenchymal apposition.  Next, bulldog clamps were removed.  Two renorrhaphy areas appeared hemostatic.   Sponge, needle counts were correct.  The 2 specimens were placed into a single EndoCatch bag.  A closed suction drain was brought through the previous superior medial robotic port site into the area of the retroperitoneum.  Robot was then undocked.   Specimen was retrieved by extending the previous camera port site for a distance of approximately 3 cm, and removing the partial nephrectomy.  SPECIMENS: 1.  Gross finding  larger mass, setting aside for pathology. 2.  Gross finding smaller mass, setting aside for pathology.  Extraction site was closed at fascia using figure-of-eight Vicryl x2 followed by reapproximation of Scarpa's with running Vicryl.  All incision sites were infiltrated  with dilute lipolyzed Marcaine and closed at the level skin using subcuticular Monocryl  and Dermabond.  Procedure terminated.  The patient tolerated the procedure well.  No immediate complications.  The patient taken to postanesthesia care unit in stable condition.    Please  note, first assistant Bari Mantis was crucial for all portions of the surgery today.  She provided invaluable retraction, vascular clipping, vascular clipping, specimen manipulation, and general persistence.  CN/NUANCE  D:04/04/2020 T:04/05/2020 JOB:[id number]

## 2020-04-05 NOTE — Progress Notes (Signed)
Urology Progress Note   1 Day Post-Op from right retroperitoneal partial nephrectomy of two masses.   Subjective: PACU hgb 12.3 NAEON.  Creatinine 1.3 Mild RLQ discomfort as expected.  JP with 25ml out since OR and 105cc overnight.   Objective: Vital signs in last 24 hours: Temp:  [96.3 F (35.7 C)-98.7 F (37.1 C)] 97.5 F (36.4 C) (05/06 0426) Pulse Rate:  [45-62] 55 (05/06 0426) Resp:  [11-18] 17 (05/05 1921) BP: (89-119)/(54-78) 101/55 (05/06 0426) SpO2:  [95 %-100 %] 98 % (05/06 0426)  Intake/Output from previous day: 05/05 0701 - 05/06 0700 In: 4399.8 [I.V.:3399.8; IV Piggyback:1000] Out: 3390 [Urine:3095; Drains:245; Blood:50] Intake/Output this shift: No intake/output data recorded.  Physical Exam:  General: Alert and oriented CV: Regular rate Lungs: No increased work of breathing Abdomen: Soft, appropriately tender. Incisions c/d/i. JP SS GU: Foley in place draining clear yellow urine  Ext: NT, No erythema  Lab Results: Recent Labs    04/04/20 1212 04/05/20 0504  HGB 12.3* 13.2  HCT 38.5* 41.9   Recent Labs    04/05/20 0504  NA 138  K 3.9  CL 105  CO2 23  GLUCOSE 131*  BUN 12  CREATININE 1.37*  CALCIUM 8.8*    Studies/Results: No results found.  Assessment/Plan:  64 y.o. male with Afib on Eliquis, HTN, OSA on CPAP s/p right retroperitoneal partial nephrectomy of two masses. Overall doing well post-op.   Multimodal analgesia Holding Eliquis at this time Home statin, Coreg, Tikosyn, Topamax Diet Medlock TOV today Bowel regimen SSI Watch JP drain output SCD, OOB, IS  Tentative discharge tomorrow morning.    LOS: 0 days

## 2020-04-05 NOTE — Care Management Obs Status (Signed)
[facility] NOTIFICATION   Patient Details  Name: Casey [name]. MRN: [id number] Date of Birth: [birth year]   Medicare Observation Status Notification Given:  Yes    Dessa Phi, RN 04/05/2020, 3:25 PM

## 2020-04-06 DIAGNOSIS — C641 Malignant neoplasm of right kidney, except renal pelvis: Secondary | ICD-10-CM | POA: Diagnosis not present

## 2020-04-06 LAB — SURGICAL PATHOLOGY

## 2020-04-06 LAB — GLUCOSE, CAPILLARY: Glucose-Capillary: 131 mg/dL — ABNORMAL HIGH (ref 70–99)

## 2020-04-06 MED ORDER — APIXABAN 5 MG PO TABS: 5.0000 mg | ORAL_TABLET | Freq: Two times a day (BID) | ORAL | Status: DC

## 2020-04-06 NOTE — Discharge Instructions (Signed)
1.  Activity:  You are encouraged to ambulate frequently (about every hour during waking hours) to help prevent blood clots from forming in your legs or lungs.  However, you should not engage in any heavy lifting (> 10-15 lbs), strenuous activity, or straining. 2. Diet: You should advance your diet as instructed by your physician.  It will be normal to have some bloating, nausea, and abdominal discomfort intermittently. 3. Prescriptions:  You will be provided a prescription for pain medication to take as needed.  If your pain is not severe enough to require the prescription pain medication, you may take extra strength Tylenol instead which will have less side effects.  You should also take a prescribed stool softener to avoid straining with bowel movements as the prescription pain medication may constipate you. 4. Incisions: You may remove your dressing bandages 48 hours after surgery if not removed in the hospital.  You will either have some small staples or special tissue glue at each of the incision sites. Once the bandages are removed (if present), the incisions may stay open to air.  You may start showering (but not soaking or bathing in water) the 2nd day after surgery and the incisions simply need to be patted dry after the shower.  No additional care is needed. 5. What to call us about: You should call the office [phone]) if you develop fever > 101 or develop persistent vomiting.   You may resume aspirin, advil, aleve, vitamins, and supplements 7 days after surgery.   You may resume Eliquis 3 days after surgery (Monday May 10th).

## 2020-04-06 NOTE — Progress Notes (Signed)
Patient discharged home.  IV removed - WNL.  Reviewed AVS and medications.  Patient encouraged to walk frequently to prevent blood clots, follow up appointment in place.  Patient verbalizes understanding.  No questions at this time, assisted off unit in NAD via WC.

## 2020-04-06 NOTE — Discharge Summary (Signed)
[facility] Discharge Summary  Admit date: 04/04/2020  Discharge date and time: 04/06/20   Discharge to: Home  Discharge Service: Urology  Discharge Attending Physician:  Dr. Alexis Frock, MD  Discharge  Diagnoses: Renal Mass  Secondary Diagnosis: Active Problems:   Renal mass   OR Procedures: Procedure(s): XI ROBOTIC ASSITED PARTIAL RIGHT NEPHRECTOMY INTRAOPATIVE ULTRASOUND 04/04/2020   Ancillary Procedures: None   Discharge Day Services: The patient was seen and examined by the Urology team both in the morning and immediately prior to discharge.  Vital signs and laboratory values were stable and within normal limits.  The physical exam was benign and unchanged and all surgical wounds were examined.  Discharge instructions were explained and all questions answered.  Subjective  No acute events overnight. Pain Controlled. No fever or chills.  Objective Patient Vitals for the past 8 hrs:  BP Temp Temp src Pulse SpO2  04/06/20 0556 102/62 98.2 F (36.8 C) Oral (!) 58 98 %   Total I/O In: -  Out: 225 [Urine:225]  General Appearance:        No acute distress Lungs:                       Normal work of breathing on room air Heart:                                Regular rate and rhythm Abdomen:                         Soft, non-tender, non-distended. Right flank with mild tenderness, expected. JP Drain site clean and intact.  Extremities:                      Warm and well perfused   Hospital Course:  The patient underwent robotic right retroperitoneal partial nephrectomy on 04/04/2020.  The patient tolerated the procedure well, was extubated in the OR, and afterwards was taken to the PACU for routine post-surgical care. When stable the patient was transferred to the floor.   The patient did well postoperatively.  The patients diet was slowly advanced and at the time of discharge was tolerating a regular diet. JP drain creatinine was consistent with serum on POD1 and  therefore removed. The patient was discharged home 2 Days Post-Op, at which point was tolerating a regular solid diet, was able to void spontaneously, have adequate pain control with P.O. pain medication, and could ambulate without difficulty. The patient will follow up with [place] for post op check.   He will resume Eliquis on Monday May 10th.   Condition at Discharge: Improved  Discharge Medications:  Allergies as of 04/06/2020      Reactions   Penicillins Hives   Has patient had a PCN reaction causing immediate rash, facial/tongue/throat swelling, SOB or lightheadedness with hypotension: Unknown Has patient had a PCN reaction causing severe rash involving mucus membranes or skin necrosis: Unknown Has patient had a PCN reaction that required hospitalization: No Has patient had a PCN reaction occurring within the last 10 years: No Childhood reaction. If all of the above answers are "NO", then may proceed with Cephalosporin use.   Percocet [oxycodone-acetaminophen] Anxiety, Other (See Comments)   Hyperactivity & unhinged. Previously tolerated hydrocodone      Medication List    STOP taking these medications   multivitamin with minerals Tabs tablet  Systane ICaps AREDS2 Tabs     TAKE these medications   apixaban 5 MG Tabs tablet Commonly known as: Eliquis Take 1 tablet (5 mg total) by mouth 2 (two) times daily. Start taking on: Apr 09, 2020 What changed:   how much to take  These instructions start on Apr 09, 2020. If you are unsure what to do until then, ask your doctor or other care provider.   atorvastatin 10 MG tablet Commonly known as: LIPITOR Take 10 mg by mouth at bedtime.   B-D UF III MINI PEN NEEDLES 31G X 5 MM Misc Generic drug: Insulin Pen Needle USE WITH VICTOZA AS DIRECTED ONCE A DAY   carvedilol 25 MG tablet Commonly known as: COREG TAKE 1/2 TABLET (12.5MG ) BY MOUTH TWICE DAILY What changed: See the new instructions.   dofetilide 500 MCG capsule Commonly  known as: TIKOSYN TAKE 1 CAPSULE (500 MCG TOTAL) BY MOUTH 2 (TWO) TIMES DAILY.   Entresto 49-51 MG Generic drug: sacubitril-valsartan TAKE 1 TABLET BY MOUTH TWICE A DAY   glimepiride 2 MG tablet Commonly known as: AMARYL Take 2 mg by mouth daily with breakfast.   glucose blood test strip USE TO TEST BLOOD SUGAR THREE TIMES A DAY   HYDROcodone-acetaminophen 10-325 MG tablet Commonly known as: NORCO Take 1 tablet by mouth every 4 (four) hours as needed (for severe back pain.).   magnesium gluconate 500 MG tablet Commonly known as: MAGONATE Take 500 mg by mouth 2 (two) times daily.   metFORMIN 500 MG 24 hr tablet Commonly known as: GLUCOPHAGE-XR Take 1,000 mg by mouth 2 (two) times daily.   spironolactone 25 MG tablet Commonly known as: ALDACTONE TAKE 1/2 TABLET BY MOUTH EVERY DAY   topiramate 25 MG tablet Commonly known as: TOPAMAX Take 25 mg by mouth 2 (two) times daily.   Victoza 18 MG/3ML Sopn Generic drug: liraglutide Inject 1.8 mg into the skin every evening.

## 2020-04-06 NOTE — Progress Notes (Signed)
Urology Progress Note   2 Days Post-Op from right retroperitoneal partial nephrectomy of two masses.   Subjective: Had a good day yesterday, clinically progressing. JP drain creatinine consistent with serum, removed yesterday Has remained afebrile and hemodynamically stable Continues to void spontaneously Reports that he does not have any nausea and had a reasonable lunch and some snacks for dinner. Has walked in the hall several times and is feeling steady on his feet Small amount of leaking to his flank JP drain site dressing, expected   Objective: Vital signs in last 24 hours: Temp:  [98.2 F (36.8 C)] 98.2 F (36.8 C) (05/07 0556) Pulse Rate:  [58] 58 (05/07 0556) Resp:  [20] 20 (05/06 2311) BP: (102)/(62) 102/62 (05/07 0556) SpO2:  [98 %] 98 % (05/07 0556)  Intake/Output from previous day: No intake/output data recorded. Intake/Output this shift: No intake/output data recorded.  Physical Exam:  General: Alert and oriented CV: Regular rate Lungs: No increased work of breathing Abdomen: Soft, appropriately tender. Incisions clean dry and intact with Dermabond, right flank JP prior site dressed appropriately GU: Voiding spontaneously Ext: NT, No erythema  Lab Results: Recent Labs    04/04/20 1212 04/05/20 0504  HGB 12.3* 13.2  HCT 38.5* 41.9   Recent Labs    04/05/20 0504  NA 138  K 3.9  CL 105  CO2 23  GLUCOSE 131*  BUN 12  CREATININE 1.37*  CALCIUM 8.8*    Studies/Results: No results found.  Assessment/Plan:  64 y.o. male with Afib on Eliquis, HTN, OSA on CPAP s/p right retroperitoneal partial nephrectomy of two masses. Overall doing well post-op.   Multimodal analgesia Holding Eliquis at this time Home statin, Coreg, Tikosyn, Topamax Diet Medlock Continue voided spontaneously Bowel regimen SSI Dressing to right flank prior JP drain site as needed SCD, OOB, IS  Tentative discharge this morning.    LOS: 0 days

## 2020-06-08 ENCOUNTER — Other Ambulatory Visit: Payer: Self-pay | Admitting: Physician Assistant

## 2020-06-08 NOTE — Telephone Encounter (Signed)
This is a [facility] pt and being followed there with his medication tikosyn. Please address

## 2020-06-10 ENCOUNTER — Other Ambulatory Visit: Payer: Self-pay | Admitting: Interventional Cardiology

## 2020-06-18 DIAGNOSIS — H353111 Nonexudative age-related macular degeneration, right eye, early dry stage: Secondary | ICD-10-CM | POA: Diagnosis not present

## 2020-06-18 DIAGNOSIS — E119 Type 2 diabetes mellitus without complications: Secondary | ICD-10-CM | POA: Diagnosis not present

## 2020-06-18 DIAGNOSIS — H25813 Combined forms of age-related cataract, bilateral: Secondary | ICD-10-CM | POA: Diagnosis not present

## 2020-06-18 DIAGNOSIS — H5213 Myopia, bilateral: Secondary | ICD-10-CM | POA: Diagnosis not present

## 2020-06-21 ENCOUNTER — Encounter (HOSPITAL_COMMUNITY): Payer: Self-pay

## 2020-07-13 DIAGNOSIS — E291 Testicular hypofunction: Secondary | ICD-10-CM | POA: Diagnosis not present

## 2020-07-13 DIAGNOSIS — Z125 Encounter for screening for malignant neoplasm of prostate: Secondary | ICD-10-CM | POA: Diagnosis not present

## 2020-07-13 DIAGNOSIS — E7800 Pure hypercholesterolemia, unspecified: Secondary | ICD-10-CM | POA: Diagnosis not present

## 2020-07-13 DIAGNOSIS — I10 Essential (primary) hypertension: Secondary | ICD-10-CM | POA: Diagnosis not present

## 2020-07-13 DIAGNOSIS — K279 Peptic ulcer, site unspecified, unspecified as acute or chronic, without hemorrhage or perforation: Secondary | ICD-10-CM | POA: Diagnosis not present

## 2020-07-13 DIAGNOSIS — E1121 Type 2 diabetes mellitus with diabetic nephropathy: Secondary | ICD-10-CM | POA: Diagnosis not present

## 2020-07-13 DIAGNOSIS — Z794 Long term (current) use of insulin: Secondary | ICD-10-CM | POA: Diagnosis not present

## 2020-07-15 ENCOUNTER — Other Ambulatory Visit (HOSPITAL_COMMUNITY): Payer: Self-pay | Admitting: Nurse Practitioner

## 2020-08-02 ENCOUNTER — Other Ambulatory Visit: Payer: Self-pay

## 2020-08-02 ENCOUNTER — Ambulatory Visit (HOSPITAL_COMMUNITY)
Admission: RE | Admit: 2020-08-02 | Discharge: 2020-08-02 | Disposition: A | Discharge status: Home / Self Care | Payer: PRIVATE HEALTH INSURANCE | Source: Ambulatory Visit | Attending: Nurse Practitioner | Admitting: Nurse Practitioner

## 2020-08-02 ENCOUNTER — Encounter (HOSPITAL_COMMUNITY): Payer: Self-pay | Admitting: Nurse Practitioner

## 2020-08-02 VITALS — BP 132/82 | HR 64 | Ht 70.0 in | Wt 215.8 lb

## 2020-08-02 DIAGNOSIS — N189 Chronic kidney disease, unspecified: Secondary | ICD-10-CM | POA: Diagnosis not present

## 2020-08-02 DIAGNOSIS — Z79899 Other long term (current) drug therapy: Secondary | ICD-10-CM | POA: Diagnosis not present

## 2020-08-02 DIAGNOSIS — I4819 Other persistent atrial fibrillation: Secondary | ICD-10-CM | POA: Diagnosis not present

## 2020-08-02 DIAGNOSIS — Z79891 Long term (current) use of opiate analgesic: Secondary | ICD-10-CM | POA: Diagnosis not present

## 2020-08-02 DIAGNOSIS — I130 Hypertensive heart and chronic kidney disease with heart failure and stage 1 through stage 4 chronic kidney disease, or unspecified chronic kidney disease: Secondary | ICD-10-CM | POA: Insufficient documentation

## 2020-08-02 DIAGNOSIS — Z880 Allergy status to penicillin: Secondary | ICD-10-CM | POA: Diagnosis not present

## 2020-08-02 DIAGNOSIS — Z5181 Encounter for therapeutic drug level monitoring: Secondary | ICD-10-CM | POA: Diagnosis not present

## 2020-08-02 DIAGNOSIS — Z885 Allergy status to narcotic agent status: Secondary | ICD-10-CM | POA: Diagnosis not present

## 2020-08-02 DIAGNOSIS — D6869 Other thrombophilia: Secondary | ICD-10-CM

## 2020-08-02 DIAGNOSIS — Z8639 Personal history of other endocrine, nutritional and metabolic disease: Secondary | ICD-10-CM | POA: Insufficient documentation

## 2020-08-02 DIAGNOSIS — Z7901 Long term (current) use of anticoagulants: Secondary | ICD-10-CM | POA: Diagnosis not present

## 2020-08-02 DIAGNOSIS — Z833 Family history of diabetes mellitus: Secondary | ICD-10-CM | POA: Diagnosis not present

## 2020-08-02 DIAGNOSIS — Z87891 Personal history of nicotine dependence: Secondary | ICD-10-CM | POA: Diagnosis not present

## 2020-08-02 DIAGNOSIS — Z9884 Bariatric surgery status: Secondary | ICD-10-CM | POA: Diagnosis not present

## 2020-08-02 DIAGNOSIS — I2510 Atherosclerotic heart disease of native coronary artery without angina pectoris: Secondary | ICD-10-CM | POA: Insufficient documentation

## 2020-08-02 DIAGNOSIS — Z7984 Long term (current) use of oral hypoglycemic drugs: Secondary | ICD-10-CM | POA: Insufficient documentation

## 2020-08-02 LAB — BASIC METABOLIC PANEL
Anion gap: 10 (ref 5–15)
BUN: 21 mg/dL (ref 8–23)
CO2: 24 mmol/L (ref 22–32)
Calcium: 9.7 mg/dL (ref 8.9–10.3)
Chloride: 106 mmol/L (ref 98–111)
Creatinine, Ser: 1.12 mg/dL (ref 0.61–1.24)
GFR calc Af Amer: >60 mL/min (ref >60)
GFR calc non Af Amer: >60 mL/min (ref >60)
Glucose, Bld: 101 mg/dL — ABNORMAL HIGH (ref 70–99)
Potassium: 4.5 mmol/L (ref 3.5–5.1)
Sodium: 140 mmol/L (ref 135–145)

## 2020-08-02 LAB — MAGNESIUM: Magnesium: 2.3 mg/dL (ref 1.7–2.4)

## 2020-08-02 NOTE — Progress Notes (Signed)
Primary Care Physician: Orpah Melter, MD Referring Physician: Dr. Fransico Him   Casey [name]. is a 64 y.o. male with a h/o obesity, s/p gastric sleeve, HTN, DM that presented for pre op for rt parotidectomy 8/8 and was found to be in rate controlled afib, from which he was asymptomatic. He was sent to the afib clinic for evaluation. 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 He has felt some fatigue, less stamina for several months. Was just seen by PCP yesterday and nothing out of the ordinary was noted with his heart rhythm. He denies any exertional chest pain.   He does not drink alcohol, no tobacco use,no excessive caffeine. He has kept his weight stable for several years. Was almost 100 lbs heavier before gastric sleeve.  I discussed with Dr. Rayann Heman and he recommended an echo and if ok, he would be considered at low risk for surgery and could start anticoagulation after surgery. However, pt is now back in the office as his echo did show a reduced EF at 30-35%. Dr. Rayann Heman discussed with Dr. Redmond Baseman and it is felt most appropriate  to delay surgery in order to obtain  LHC to further determine his risk for surgery. He does have cardiac risk factors for CAD , ie , obesity,  HTN, DM. He denies any exertional chest pain but c/o fatigue/ low stamina  for several months/early summer. LHC did show 3 vessel  moderate  CAD  F/u in afib clinic, he is now been on anticoagulation x 2 weeks without interruption and can now schedule cardioversion after another week, he is in agreement.  F/u in afib clinic,9/20, he unfortunately had unsuccessful cardioversion and is back in the clinic to discuss antiarrythmic's.It was decided that he would come into the hospital for Tikosyn after he checked on the price of the drug.  F/u in afib clinic, 10/1, for admission for Tikosyn.  He will get the drug free for the rest  of the year as he had met his out of pocket deductible.   F/u in afib clinic 10/11. He is staying in [place] on [place]. He feels improved. His reduced EF meds have been titrated by Dr. Tamala Julian.  F/u in afib clinic,01/07/19. He is in SR and has been doing well staying in rhythm on dofetilide. He ultimately had his parotid gland successfully removed and did not have any issues with the surgery, he was benign. He had a sleep study and it was positive for OSA. He is suppose to pick up his equipment soon.He has had an echo since restoring SR and has shown normalization of EF.  F/u in afib clinic 10/22, he is staying in [place] with tikosyn. He has had neck surgeries and seeing some improvement in his pain.   F/u in afib clinic, 02/01/20. He continues to stay in [place] with Tikosyn. Continues on eliquis with a CHA2DS2VASc of 2. Had a back injection last week and came off eliquis x 3 days. He is anticipating  a surgery for a rod to be placed in his cervical spine in the near future. Has had both covid shots.  Here in afib clinic 08/02/20,  for dofetilide screening. He reports that he  is doing well without any afib burden. EKG shows SR with stable qt. He also reports have renal CA and had Rt partial nephrectomy in May. It was felt the surgery  was successful and he did not have to have any f/u radiation or  Chemo. He remained in SR during the surgery. He has lost around 39 lbs form the surgery, mostly as he did not have much appetite for several weeks following surgery.   Today, he denies symptoms of palpitations, chest pain, shortness of breath, orthopnea, PND, lower extremity edema, dizziness, presyncope, syncope, or neurologic sequela.  The patient is tolerating medications without difficulties and is otherwise without complaint today.   Past Medical History:  Diagnosis Date  . Arthritis   . Atrial fibrillation ([place]) 07/2018  . Cancer ([place])    skin Ca on nose,kidney  . Chronic kidney disease   . Complication of  anesthesia    diff. waking up  . Diabetes mellitus   . Dysrhythmia 07/2018   A-Fib  . Headache(784.0)   . Hypertension    dr Marisue Humble   pcp  . Sleep apnea    prior to weight lose surgery-Dr. Don Perking not use CPAP   Past Surgical History:  Procedure Laterality Date  . APPENDECTOMY    . BACK SURGERY     5  back surgeries  . CARDIAC CATHETERIZATION    . CARDIOVERSION N/A 08/13/2018   Procedure: CARDIOVERSION;  Surgeon: Larey Dresser, MD;  Location: [facility];  Service: Cardiovascular;  Laterality: N/A;  . CARDIOVERSION N/A 09/02/2018   Procedure: CARDIOVERSION;  Surgeon: Thayer Headings, MD;  Location: [place];  Service: Cardiovascular;  Laterality: N/A;  . CARPAL TUNNEL RELEASE     bil  . COLON SURGERY     2004 for colon rupture  . HERNIA REPAIR    . LAPAROSCOPIC GASTRIC BANDING    . LEFT HEART CATH AND CORONARY ANGIOGRAPHY N/A 07/21/2018   Procedure: LEFT HEART CATH AND CORONARY ANGIOGRAPHY;  Surgeon: Belva Crome, MD;  Location: [place] CV LAB;  Service: Cardiovascular;  Laterality: N/A;  . MANDIBLE FRACTURE SURGERY     x 2  . PAROTIDECTOMY Right 11/19/2018   Procedure: PAROTIDECTOMY;  Surgeon: Melida Quitter, MD;  Location: [place];  Service: ENT;  Laterality: Right;  . ROBOTIC ASSITED PARTIAL NEPHRECTOMY Right 04/04/2020   Procedure: XI ROBOTIC ASSITED PARTIAL NEPHRECTOMYINTRAOPATIVE ULTRASOUND;  Surgeon: Alexis Frock, MD;  Location: [facility];  Service: Urology;  Laterality: Right;  3.5 HRS  . TONSILLECTOMY      Current Outpatient Medications  Medication Sig Dispense Refill  . atorvastatin (LIPITOR) 10 MG tablet Take 10 mg by mouth at bedtime.   1  . B-D UF III MINI PEN NEEDLES 31G X 5 MM MISC USE WITH VICTOZA AS DIRECTED ONCE A DAY    . carvedilol (COREG) 25 MG tablet Take 0.5 tablets (12.5 mg total) by mouth in the morning and at bedtime. 90 tablet 0  . dofetilide (TIKOSYN) 500 MCG capsule TAKE 1 CAPSULE (500 MCG TOTAL) BY MOUTH 2 (TWO) TIMES DAILY.  180 capsule 2  . ELIQUIS 5 MG TABS tablet TAKE 1 TABLET BY MOUTH TWICE A DAY 60 tablet 6  . glimepiride (AMARYL) 2 MG tablet Take 2 mg by mouth daily with breakfast.    . glucose blood test strip USE TO TEST BLOOD SUGAR THREE TIMES A DAY    . HYDROcodone-acetaminophen (NORCO) 10-325 MG tablet Take 1 tablet by mouth every 4 (four) hours as needed (for severe back pain.).    [name] liraglutide (VICTOZA) 18 MG/3ML SOPN Inject 1.8 mg into the skin every evening.     . magnesium gluconate (MAGONATE) 500  MG tablet Take 500 mg by mouth 2 (two) times daily.    . metFORMIN (GLUCOPHAGE-XR) 500 MG 24 hr tablet Take 1,000 mg by mouth 2 (two) times daily.  1  . Multiple Vitamin (ONE DAILY MULTIVITAMIN ADULT PO) Take 1 tablet by mouth daily.    . sacubitril-valsartan (ENTRESTO) 49-51 MG Take 1 tablet by mouth 2 (two) times daily. 180 tablet 0  . spironolactone (ALDACTONE) 25 MG tablet TAKE 1/2 TABLET BY MOUTH EVERY DAY 45 tablet 0  . topiramate (TOPAMAX) 25 MG tablet Take 25 mg by mouth 2 (two) times daily.      No current facility-administered medications for this encounter.    Allergies  Allergen Reactions  . Penicillins Hives    Has patient had a PCN reaction causing immediate rash, facial/tongue/throat swelling, SOB or lightheadedness with hypotension: Unknown Has patient had a PCN reaction causing severe rash involving mucus membranes or skin necrosis: Unknown Has patient had a PCN reaction that required hospitalization: No Has patient had a PCN reaction occurring within the last 10 years: No Childhood reaction. If all of the above answers are "NO", then may proceed with Cephalosporin use.   [name] Percocet [Oxycodone-Acetaminophen] Anxiety and Other (See Comments)    Hyperactivity & unhinged. Previously tolerated hydrocodone  . Codeine Anxiety    Social History   Socioeconomic History  . Marital status: Married    Spouse name: Not on file  . Number of children: Not on file  . Years of education:  Not on file  . Highest education level: Not on file  Occupational History  . Not on file  Tobacco Use  . Smoking status: Former Smoker    Packs/day: 0.90    Years: 15.00    Pack years: 13.50    Types: Cigarettes    Quit date: 01/19/2006    Years since quitting: 14.5  . Smokeless tobacco: Never Used  Vaping Use  . Vaping Use: Never used  Substance and Sexual Activity  . Alcohol use: No  . Drug use: No  . Sexual activity: Not on file  Other Topics Concern  . Not on file  Social History Narrative  . Not on file   Social Determinants of Health   Financial Resource Strain:   . Difficulty of Paying Living Expenses: Not on file  Food Insecurity:   . Worried About Charity fundraiser in the Last Year: Not on file  . Ran Out of Food in the Last Year: Not on file  Transportation Needs:   . Lack of Transportation (Medical): Not on file  . Lack of Transportation (Non-Medical): Not on file  Physical Activity:   . Days of Exercise per Week: Not on file  . Minutes of Exercise per Session: Not on file  Stress:   . Feeling of Stress : Not on file  Social Connections:   . Frequency of Communication with Friends and Family: Not on file  . Frequency of Social Gatherings with Friends and Family: Not on file  . Attends Religious Services: Not on file  . Active Member of Clubs or Organizations: Not on file  . Attends Archivist Meetings: Not on file  . Marital Status: Not on file  Intimate Partner Violence:   . Fear of Current or Ex-Partner: Not on file  . Emotionally Abused: Not on file  . Physically Abused: Not on file  . Sexually Abused: Not on file    Family History  Problem Relation Age of Onset  .  Diabetes Maternal Grandfather   . Heart disease Neg Hx     ROS- All systems are reviewed and negative except as per the HPI above  Physical Exam: Vitals:   08/02/20 0950  Weight: 97.9 kg  Height: 5' 10"  (1.778 m)   Wt Readings from Last 3 Encounters:  08/02/20  97.9 kg  04/04/20 106.2 kg  03/30/20 106.2 kg    Labs: Lab Results  Component Value Date   NA 138 04/05/2020   K 3.9 04/05/2020   CL 105 04/05/2020   CO2 23 04/05/2020   GLUCOSE 131 (H) 04/05/2020   BUN 12 04/05/2020   CREATININE 1.37 (H) 04/05/2020   CALCIUM 8.8 (L) 04/05/2020   MG 1.9 02/01/2020   Lab Results  Component Value Date   INR 1.11 11/19/2018   Lab Results  Component Value Date   CHOL 129 12/08/2018   HDL 45 12/08/2018   LDLCALC 66 12/08/2018   TRIG 89 12/08/2018     GEN- The patient is well appearing, alert and oriented x 3 today.   Head- normocephalic, atraumatic Eyes-  Sclera clear, conjunctiva pink Ears- hearing intact Oropharynx- clear Neck- supple, no JVP Lymph- no cervical lymphadenopathy Lungs- Clear to ausculation bilaterally, normal work of breathing Heart-regular rate and rhythm, no murmurs, rubs or gallops, PMI not laterally displaced GI- soft, NT, ND, + BS Extremities- no clubbing, cyanosis, or edema MS- no significant deformity or atrophy Skin- no rash or lesion Psych- euthymic mood, full affect Neuro- strength and sensation are intact  EKG- NSR at 64 bpm, pr int 178, qrs int 96 ms, qtc 435 ms Epic records reviewed Echo-Study Conclusions  - Left ventricle: The cavity size was mildly dilated. Wall   thickness was increased in a pattern of mild LVH. Systolic   function was moderately to severely reduced. The estimated   ejection fraction was in the range of 30% to 35%. Diffuse   hypokinesis. - Aortic valve: There was trivial regurgitation. - Mitral valve: There was mild regurgitation.  Impressions: Echo- 11/2019-Study Conclusions  - Left ventricle: The cavity size was normal. Systolic function was   normal. Wall motion was normal; there were no regional wall   motion abnormalities. Doppler parameters are consistent with   abnormal left ventricular relaxation (grade 1 diastolic   dysfunction). Doppler parameters are  consistent with   indeterminate ventricular filling pressure. - Aortic valve: Transvalvular velocity was within the normal range.   There was no stenosis. There was mild regurgitation. - Aorta: Ascending aortic diameter: 37 mm (S). - Ascending aorta: The ascending aorta was mildly dilated. - Mitral valve: Transvalvular velocity was within the normal range.   There was no evidence for stenosis. There was no regurgitation. - Right ventricle: The cavity size was normal. Wall thickness was   normal. Systolic function was normal. - Atrial septum: No defect or patent foramen ovale was identified. - Tricuspid valve: Transvalvular velocity was within the normal   range. There was no regurgitation. - Pulmonary arteries: Systolic pressure could not be accurately   estimated. - Pericardium, extracardiac: A trivial pericardial effusion was   identified. - Global longitudinal strain -15.1% (mildly abnormal).   LHC- 8/21-Moderate three-vessel coronary artery disease.  60 to 70% mid RCA and 95% stenosis of the distal right coronary beyond the origin of the PDA.  Normal left main  Mid LAD 60 to 70% stenosis beyond the origin of the dominant first diagonal.  Large ramus intermedius with luminal irregularity.  Relatively small distribution circumflex  with 70% obstruction in the first marginal and total occlusion of the distal circumflex before the origin of the small second obtuse marginal.  There are faint left to left collaterals to the distal circumflex.  Moderately severe left ventricular dysfunction with global hypokinesis, EF 35%.  Normal LVEDP.    RECOMMENDATIONS:   In absence of significant/limiting anginal symptoms, would recommend anti-ischemic therapy with beta-blockade and long-acting nitrates.  If symptoms develop or become refractory PCI of the very distal RCA, and LAD would be possible.  Lesions were not angiographically critical, and therefore based on data from the [facility]  trial, medical therapy would seem to be adequate.  Recommend guideline directed therapy for systolic heart failure: Transition to Entresto from ARB, transition Tenormin to carvedilol, add mineralocorticoid receptor antagonist (Aldactone or eplerenone).  With reference to heart failure, diabetes management should include an SGLT 2 agent to decrease incidence of heart failure symptoms.  Finally it may be helpful to have restoration of sinus rhythm.  LV dysfunction is out of proportion to the degree of coronary disease.   Recommend Aspirin 44m daily for moderate CAD.  Assessment and Plan: 1. Persistent  Afib Staying in rhythm with dofetilide Continue drug at 500 mcg bid Qtc stable Bmet/mag today Continue  OSA, on bipap  2.CHA2DS2VASc score of 3(htn,CAD,  DM)  Continue eliquis 5 mg bid   3. CAD / LV dysfunction  EF has normalized with restoring SR No anginal symptoms   4. Rt renal CA S/p surgery 03/2020 Per oncology   F/u here in 6 months for tikosyn surveillance   F/u with Dr. STamala Julianas scheduled   DGeroge Baseman Maddie Brazier, AL[facility] Hospital[address] Princess LaneGPond Creek [place] [phone]

## 2020-08-15 DIAGNOSIS — M961 Postlaminectomy syndrome, not elsewhere classified: Secondary | ICD-10-CM | POA: Diagnosis not present

## 2020-08-15 DIAGNOSIS — R519 Headache, unspecified: Secondary | ICD-10-CM | POA: Diagnosis not present

## 2020-08-15 DIAGNOSIS — M47812 Spondylosis without myelopathy or radiculopathy, cervical region: Secondary | ICD-10-CM | POA: Diagnosis not present

## 2020-08-22 NOTE — Progress Notes (Signed)
Cardiology Office Note:    Date:  08/24/2020   ID:  Casey Garbe., DOB 1956-02-09, MRN [id number]  PCP:  Orpah Melter, MD  Cardiologist:  Sinclair Grooms, MD   Referring MD: Orpah Melter, MD   Chief Complaint  Patient presents with  . Coronary Artery Disease  . Congestive Heart Failure  . Atrial Fibrillation    History of Present Illness:    Casey D Aidian Salomon. is a 64 y.o. male with a hx of atrial fibrillation, systolic left ventricular dysfunction with EF less than 35%--> 65% after conversion and addition of Tikosyn and GDMT for HF, and further w/u showed no obstructive CAD.  He is asymptomatic, tolerating heart failure therapy without issues, tolerating statin therapy without issues, and all dofetilide without side effects are recent recurrence of atrial fibrillation.  Past Medical History:  Diagnosis Date  . Arthritis   . Atrial fibrillation ([place]) 07/2018  . Cancer ([place])    skin Ca on nose,kidney  . Chronic kidney disease   . Complication of anesthesia    diff. waking up  . Diabetes mellitus   . Dysrhythmia 07/2018   A-Fib  . Headache(784.0)   . Hypertension    dr Marisue Humble   pcp  . Sleep apnea    prior to weight lose surgery-Dr. Don Perking not use CPAP    Past Surgical History:  Procedure Laterality Date  . APPENDECTOMY    . BACK SURGERY     5  back surgeries  . CARDIAC CATHETERIZATION    . CARDIOVERSION N/A 08/13/2018   Procedure: CARDIOVERSION;  Surgeon: Larey Dresser, MD;  Location[facility] ENDOSCOPY;  Service: Cardiovascular;  Laterality: N/A;  . CARDIOVERSION N/A 09/02/2018   Procedure: CARDIOVERSION;  Surgeon: Thayer Headings, MD;  Location: [place];  Service: Cardiovascular;  Laterality: N/A;  . CARPAL TUNNEL RELEASE     bil  . COLON SURGERY     2004 for colon rupture  . HERNIA REPAIR    . LAPAROSCOPIC GASTRIC BANDING    . LEFT HEART CATH AND CORONARY ANGIOGRAPHY N/A 07/21/2018   Procedure: LEFT HEART CATH AND CORONARY  ANGIOGRAPHY;  Surgeon: Belva Crome, MD;  Location: [place] CV LAB;  Service: Cardiovascular;  Laterality: N/A;  . MANDIBLE FRACTURE SURGERY     x 2  . PAROTIDECTOMY Right 11/19/2018   Procedure: PAROTIDECTOMY;  Surgeon: Melida Quitter, MD;  Location: [place];  Service: ENT;  Laterality: Right;  . ROBOTIC ASSITED PARTIAL NEPHRECTOMY Right 04/04/2020   Procedure: XI ROBOTIC ASSITED PARTIAL NEPHRECTOMYINTRAOPATIVE ULTRASOUND;  Surgeon: Alexis Frock, MD;  Location: [facility];  Service: Urology;  Laterality: Right;  3.5 HRS  . TONSILLECTOMY      Current Medications: Current Meds  Medication Sig  . atorvastatin (LIPITOR) 10 MG tablet Take 10 mg by mouth at bedtime.   . B-D UF III MINI PEN NEEDLES 31G X 5 MM MISC USE WITH VICTOZA AS DIRECTED ONCE A DAY  . carvedilol (COREG) 25 MG tablet Take 0.5 tablets (12.5 mg total) by mouth in the morning and at bedtime.  . dofetilide (TIKOSYN) 500 MCG capsule TAKE 1 CAPSULE (500 MCG TOTAL) BY MOUTH 2 (TWO) TIMES DAILY.  [name] ELIQUIS 5 MG TABS tablet TAKE 1 TABLET BY MOUTH TWICE A DAY  . glimepiride (AMARYL) 2 MG tablet Take 2 mg by mouth daily with breakfast.  . glucose blood test strip USE TO TEST BLOOD SUGAR THREE TIMES A DAY  . HYDROcodone-acetaminophen (NORCO) 10-325 MG  tablet Take 1 tablet by mouth every 4 (four) hours as needed (for severe back pain.).  [name] liraglutide (VICTOZA) 18 MG/3ML SOPN Inject 1.8 mg into the skin every evening.   . magnesium gluconate (MAGONATE) 500 MG tablet Take 500 mg by mouth 2 (two) times daily.  . metFORMIN (GLUCOPHAGE-XR) 500 MG 24 hr tablet Take 1,000 mg by mouth 2 (two) times daily.  . Multiple Vitamin (ONE DAILY MULTIVITAMIN ADULT PO) Take 1 tablet by mouth daily.  . Multiple Vitamins-Minerals (PRESERVISION AREDS PO) Take 1 capsule by mouth 2 (two) times daily.  . sacubitril-valsartan (ENTRESTO) 49-51 MG Take 1 tablet by mouth 2 (two) times daily.  [name] spironolactone (ALDACTONE) 25 MG tablet TAKE 1/2 TABLET BY MOUTH EVERY  DAY  . topiramate (TOPAMAX) 25 MG tablet Take 25 mg by mouth 2 (two) times daily.   . [DISCONTINUED] sacubitril-valsartan (ENTRESTO) 49-51 MG Take 1 tablet by mouth 2 (two) times daily.     Allergies:   Penicillins, Percocet [oxycodone-acetaminophen], and Codeine   Social History   Socioeconomic History  . Marital status: Married    Spouse name: Not on file  . Number of children: Not on file  . Years of education: Not on file  . Highest education level: Not on file  Occupational History  . Not on file  Tobacco Use  . Smoking status: Former Smoker    Packs/day: 0.90    Years: 15.00    Pack years: 13.50    Types: Cigarettes    Quit date: 01/19/2006    Years since quitting: 14.6  . Smokeless tobacco: Never Used  Vaping Use  . Vaping Use: Never used  Substance and Sexual Activity  . Alcohol use: No  . Drug use: No  . Sexual activity: Not on file  Other Topics Concern  . Not on file  Social History Narrative  . Not on file   Social Determinants of Health   Financial Resource Strain:   . Difficulty of Paying Living Expenses: Not on file  Food Insecurity:   . Worried About Charity fundraiser in the Last Year: Not on file  . Ran Out of Food in the Last Year: Not on file  Transportation Needs:   . Lack of Transportation (Medical): Not on file  . Lack of Transportation (Non-Medical): Not on file  Physical Activity:   . Days of Exercise per Week: Not on file  . Minutes of Exercise per Session: Not on file  Stress:   . Feeling of Stress : Not on file  Social Connections:   . Frequency of Communication with Friends and Family: Not on file  . Frequency of Social Gatherings with Friends and Family: Not on file  . Attends Religious Services: Not on file  . Active Member of Clubs or Organizations: Not on file  . Attends Archivist Meetings: Not on file  . Marital Status: Not on file     Family History: The patient's family history includes Diabetes in his  maternal grandfather. There is no history of Heart disease.  ROS:   Please see the history of present illness.    He has not had bleeding on Eliquis.  He has had COVID-19 vaccination.  He is diabetic but not on SGLT2 therapy.  This should be considered given systolic heart failure.  His LV function has returned to normal on medical therapy.  All other systems reviewed and are negative.  EKGs/Labs/Other Studies Reviewed:    The following studies were  reviewed today: No new functional data  EKG:  EKG not repeated  Recent Labs: 02/20/2020: ALT 17 03/30/2020: Platelets 231 04/05/2020: Hemoglobin 13.2 08/02/2020: BUN 21; Creatinine, Ser 1.12; Magnesium 2.3; Potassium 4.5; Sodium 140  Recent Lipid Panel    Component Value Date/Time   CHOL 129 12/08/2018 1113   TRIG 89 12/08/2018 1113   HDL 45 12/08/2018 1113   CHOLHDL 2.9 12/08/2018 1113   LDLCALC 66 12/08/2018 1113    Physical Exam:    VS:  BP (!) 142/80   Pulse (!) 54   Ht 5\' 10"  (1.778 m)   Wt 214 lb 12.8 oz (97.4 kg)   SpO2 98%   BMI 30.82 kg/m     Wt Readings from Last 3 Encounters:  08/24/20 214 lb 12.8 oz (97.4 kg)  08/02/20 215 lb 12.8 oz (97.9 kg)  04/04/20 234 lb 3 oz (106.2 kg)     GEN: Moderate obesity. No acute distress HEENT: Normal NECK: No JVD. LYMPHATICS: No lymphadenopathy CARDIAC:  RRR without murmur, gallop, or edema. VASCULAR:  Normal Pulses. No bruits. RESPIRATORY:  Clear to auscultation without rales, wheezing or rhonchi  ABDOMEN: Soft, non-tender, non-distended, No pulsatile mass, MUSCULOSKELETAL: No deformity  SKIN: Warm and dry NEUROLOGIC:  Alert and oriented x 3 PSYCHIATRIC:  Normal affect   ASSESSMENT:    1. Persistent atrial fibrillation ([place])   2. Secondary hypercoagulable state ([place])   3. Coronary artery disease involving native coronary artery of native heart without angina pectoris   4. Chronic systolic heart failure ([place])   5. Hyperlipidemia with target LDL less than 70   6.  Uncontrolled type 2 diabetes mellitus with hyperglycemia, without long-term current use of insulin ([place])   7. Obstructive sleep apnea (adult) (pediatric)   8. Educated about COVID-19 virus infection    PLAN:    In order of problems listed above:  1. Now in sinus rhythm on dofetilide which she is tolerating well and being comanaged by the atrial fibrillation clinic. 2. Tolerating Eliquis therapy without trouble. 3. Secondary prevention discussed.  Physical activity is significantly increased. 4. LV function is normalized on carvedilol, Aldactone, and Entresto.  He is not on SGLT2 therapy.  LVEF is now 65%. 5. Continue Lipitor 10 mg/day. 6. Consider adding or replacing one of his current agents with [place]. 7. CPAP is recommended 8. He has been vaccinated and if the booster is recommended he will take it.  Overall education and awareness concerning primary/secondary risk prevention was discussed in detail: LDL less than 70, hemoglobin A1c less than 7, blood pressure target less than 130/80 mmHg, >150 minutes of moderate aerobic activity per week, avoidance of smoking, weight control (via diet and exercise), and continued surveillance/management of/for obstructive sleep apnea.    Medication Adjustments/Labs and Tests Ordered: Current medicines are reviewed at length with the patient today.  Concerns regarding medicines are outlined above.  No orders of the defined types were placed in this encounter.  Meds ordered this encounter  Medications  . sacubitril-valsartan (ENTRESTO) 49-51 MG    Sig: Take 1 tablet by mouth 2 (two) times daily.    Dispense:  180 tablet    Refill:  3    There are no Patient Instructions on file for this visit.   Signed, Sinclair Grooms, MD  08/24/2020 10:52 AM    Casey [name]

## 2020-08-24 ENCOUNTER — Encounter: Payer: Self-pay | Admitting: Interventional Cardiology

## 2020-08-24 ENCOUNTER — Other Ambulatory Visit: Payer: Self-pay

## 2020-08-24 ENCOUNTER — Ambulatory Visit (INDEPENDENT_AMBULATORY_CARE_PROVIDER_SITE_OTHER): Payer: PRIVATE HEALTH INSURANCE | Admitting: Interventional Cardiology

## 2020-08-24 VITALS — BP 142/80 | HR 54 | Ht 70.0 in | Wt 214.8 lb

## 2020-08-24 DIAGNOSIS — I4819 Other persistent atrial fibrillation: Secondary | ICD-10-CM

## 2020-08-24 DIAGNOSIS — I5022 Chronic systolic (congestive) heart failure: Secondary | ICD-10-CM | POA: Diagnosis not present

## 2020-08-24 DIAGNOSIS — Z7189 Other specified counseling: Secondary | ICD-10-CM

## 2020-08-24 DIAGNOSIS — D6869 Other thrombophilia: Secondary | ICD-10-CM

## 2020-08-24 DIAGNOSIS — G4733 Obstructive sleep apnea (adult) (pediatric): Secondary | ICD-10-CM | POA: Diagnosis not present

## 2020-08-24 DIAGNOSIS — E1165 Type 2 diabetes mellitus with hyperglycemia: Secondary | ICD-10-CM | POA: Diagnosis not present

## 2020-08-24 DIAGNOSIS — E785 Hyperlipidemia, unspecified: Secondary | ICD-10-CM | POA: Diagnosis not present

## 2020-08-24 DIAGNOSIS — I2510 Atherosclerotic heart disease of native coronary artery without angina pectoris: Secondary | ICD-10-CM | POA: Diagnosis not present

## 2020-08-24 MED ORDER — ENTRESTO 49-51 MG PO TABS: 1.0000 | ORAL_TABLET | Freq: Two times a day (BID) | ORAL | 3 refills | Status: DC

## 2020-08-24 NOTE — Patient Instructions (Signed)

## 2020-09-09 ENCOUNTER — Other Ambulatory Visit: Payer: Self-pay | Admitting: Interventional Cardiology

## 2020-10-19 ENCOUNTER — Other Ambulatory Visit: Payer: Self-pay

## 2020-10-19 ENCOUNTER — Ambulatory Visit (HOSPITAL_COMMUNITY)
Admission: RE | Admit: 2020-10-19 | Discharge: 2020-10-19 | Disposition: A | Discharge status: Home / Self Care | Payer: PRIVATE HEALTH INSURANCE | Source: Ambulatory Visit | Attending: Urology | Admitting: Urology

## 2020-10-19 ENCOUNTER — Other Ambulatory Visit (HOSPITAL_COMMUNITY): Payer: Self-pay | Admitting: Urology

## 2020-10-19 DIAGNOSIS — Q8909 Congenital malformations of spleen: Secondary | ICD-10-CM | POA: Diagnosis not present

## 2020-10-19 DIAGNOSIS — C641 Malignant neoplasm of right kidney, except renal pelvis: Secondary | ICD-10-CM

## 2020-10-19 DIAGNOSIS — C649 Malignant neoplasm of unspecified kidney, except renal pelvis: Secondary | ICD-10-CM | POA: Diagnosis not present

## 2020-10-19 DIAGNOSIS — N200 Calculus of kidney: Secondary | ICD-10-CM | POA: Diagnosis not present

## 2020-10-19 DIAGNOSIS — N281 Cyst of kidney, acquired: Secondary | ICD-10-CM | POA: Diagnosis not present

## 2020-10-22 DIAGNOSIS — R3915 Urgency of urination: Secondary | ICD-10-CM | POA: Diagnosis not present

## 2020-10-22 DIAGNOSIS — Z125 Encounter for screening for malignant neoplasm of prostate: Secondary | ICD-10-CM | POA: Diagnosis not present

## 2020-10-22 DIAGNOSIS — C641 Malignant neoplasm of right kidney, except renal pelvis: Secondary | ICD-10-CM | POA: Diagnosis not present

## 2020-10-22 DIAGNOSIS — N5201 Erectile dysfunction due to arterial insufficiency: Secondary | ICD-10-CM | POA: Diagnosis not present

## 2020-11-05 DIAGNOSIS — R519 Headache, unspecified: Secondary | ICD-10-CM | POA: Diagnosis not present

## 2020-11-05 DIAGNOSIS — M47812 Spondylosis without myelopathy or radiculopathy, cervical region: Secondary | ICD-10-CM | POA: Diagnosis not present

## 2020-11-05 DIAGNOSIS — M961 Postlaminectomy syndrome, not elsewhere classified: Secondary | ICD-10-CM | POA: Diagnosis not present

## 2020-12-28 DIAGNOSIS — R519 Headache, unspecified: Secondary | ICD-10-CM | POA: Diagnosis not present

## 2020-12-28 DIAGNOSIS — M47812 Spondylosis without myelopathy or radiculopathy, cervical region: Secondary | ICD-10-CM | POA: Diagnosis not present

## 2020-12-28 DIAGNOSIS — M5124 Other intervertebral disc displacement, thoracic region: Secondary | ICD-10-CM | POA: Diagnosis not present

## 2020-12-28 DIAGNOSIS — M961 Postlaminectomy syndrome, not elsewhere classified: Secondary | ICD-10-CM | POA: Diagnosis not present

## 2021-01-14 DIAGNOSIS — I4891 Unspecified atrial fibrillation: Secondary | ICD-10-CM | POA: Diagnosis not present

## 2021-01-14 DIAGNOSIS — E7800 Pure hypercholesterolemia, unspecified: Secondary | ICD-10-CM | POA: Diagnosis not present

## 2021-01-14 DIAGNOSIS — Z7984 Long term (current) use of oral hypoglycemic drugs: Secondary | ICD-10-CM | POA: Diagnosis not present

## 2021-01-14 DIAGNOSIS — I10 Essential (primary) hypertension: Secondary | ICD-10-CM | POA: Diagnosis not present

## 2021-01-14 DIAGNOSIS — I700 Atherosclerosis of aorta: Secondary | ICD-10-CM | POA: Diagnosis not present

## 2021-01-14 DIAGNOSIS — E1121 Type 2 diabetes mellitus with diabetic nephropathy: Secondary | ICD-10-CM | POA: Diagnosis not present

## 2021-01-14 DIAGNOSIS — K279 Peptic ulcer, site unspecified, unspecified as acute or chronic, without hemorrhage or perforation: Secondary | ICD-10-CM | POA: Diagnosis not present

## 2021-01-14 DIAGNOSIS — D6869 Other thrombophilia: Secondary | ICD-10-CM | POA: Diagnosis not present

## 2021-01-14 DIAGNOSIS — I5022 Chronic systolic (congestive) heart failure: Secondary | ICD-10-CM | POA: Diagnosis not present

## 2021-01-30 ENCOUNTER — Encounter (HOSPITAL_COMMUNITY): Payer: Self-pay | Admitting: Nurse Practitioner

## 2021-01-30 ENCOUNTER — Ambulatory Visit (HOSPITAL_COMMUNITY)
Admission: RE | Admit: 2021-01-30 | Discharge: 2021-01-30 | Disposition: A | Discharge status: Home / Self Care | Payer: PRIVATE HEALTH INSURANCE | Source: Ambulatory Visit | Attending: Nurse Practitioner | Admitting: Nurse Practitioner

## 2021-01-30 ENCOUNTER — Other Ambulatory Visit: Payer: Self-pay

## 2021-01-30 VITALS — BP 134/72 | HR 50 | Ht 70.0 in | Wt 218.8 lb

## 2021-01-30 DIAGNOSIS — G4733 Obstructive sleep apnea (adult) (pediatric): Secondary | ICD-10-CM | POA: Insufficient documentation

## 2021-01-30 DIAGNOSIS — Z7901 Long term (current) use of anticoagulants: Secondary | ICD-10-CM | POA: Insufficient documentation

## 2021-01-30 DIAGNOSIS — E1122 Type 2 diabetes mellitus with diabetic chronic kidney disease: Secondary | ICD-10-CM | POA: Diagnosis not present

## 2021-01-30 DIAGNOSIS — I129 Hypertensive chronic kidney disease with stage 1 through stage 4 chronic kidney disease, or unspecified chronic kidney disease: Secondary | ICD-10-CM | POA: Diagnosis not present

## 2021-01-30 DIAGNOSIS — I2510 Atherosclerotic heart disease of native coronary artery without angina pectoris: Secondary | ICD-10-CM | POA: Diagnosis not present

## 2021-01-30 DIAGNOSIS — Z79899 Other long term (current) drug therapy: Secondary | ICD-10-CM | POA: Diagnosis not present

## 2021-01-30 DIAGNOSIS — I4819 Other persistent atrial fibrillation: Secondary | ICD-10-CM | POA: Diagnosis not present

## 2021-01-30 DIAGNOSIS — N189 Chronic kidney disease, unspecified: Secondary | ICD-10-CM | POA: Insufficient documentation

## 2021-01-30 DIAGNOSIS — Z87891 Personal history of nicotine dependence: Secondary | ICD-10-CM | POA: Insufficient documentation

## 2021-01-30 DIAGNOSIS — Z85528 Personal history of other malignant neoplasm of kidney: Secondary | ICD-10-CM | POA: Diagnosis not present

## 2021-01-30 DIAGNOSIS — E669 Obesity, unspecified: Secondary | ICD-10-CM | POA: Insufficient documentation

## 2021-01-30 DIAGNOSIS — D6869 Other thrombophilia: Secondary | ICD-10-CM | POA: Diagnosis not present

## 2021-01-30 LAB — BASIC METABOLIC PANEL
Anion gap: 7 (ref 5–15)
BUN: 31 mg/dL — ABNORMAL HIGH (ref 8–23)
CO2: 23 mmol/L (ref 22–32)
Calcium: 9.3 mg/dL (ref 8.9–10.3)
Chloride: 107 mmol/L (ref 98–111)
Creatinine, Ser: 1.21 mg/dL (ref 0.61–1.24)
GFR, Estimated: >60 mL/min (ref >60)
Glucose, Bld: 91 mg/dL (ref 70–99)
Potassium: 4.7 mmol/L (ref 3.5–5.1)
Sodium: 137 mmol/L (ref 135–145)

## 2021-01-30 LAB — MAGNESIUM: Magnesium: 2.4 mg/dL (ref 1.7–2.4)

## 2021-01-30 NOTE — Progress Notes (Signed)
Primary Care Physician: Orpah Melter, MD Referring Physician: Dr. Fransico Him   Casey [name]. is a 65 y.o. male with a h/o obesity, s/p gastric sleeve, HTN, DM that presented for pre op for rt parotidectomy 8/8 and was found to be in rate controlled afib, from which he was asymptomatic. He was sent to the afib clinic for evaluation. 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 He has felt some fatigue, less stamina for several months. Was just seen by PCP yesterday and nothing out of the ordinary was noted with his heart rhythm. He denies any exertional chest pain.   He does not drink alcohol, no tobacco use,no excessive caffeine. He has kept his weight stable for several years. Was almost 100 lbs heavier before gastric sleeve.  I discussed with Dr. Rayann Heman and he recommended an echo and if ok, he would be considered at low risk for surgery and could start anticoagulation after surgery. However, pt is now back in the office as his echo did show a reduced EF at 30-35%. Dr. Rayann Heman discussed with Dr. Redmond Baseman and it is felt most appropriate  to delay surgery in order to obtain  LHC to further determine his risk for surgery. He does have cardiac risk factors for CAD , ie , obesity,  HTN, DM. He denies any exertional chest pain but c/o fatigue/ low stamina  for several months/early summer. LHC did show 3 vessel  moderate  CAD  F/u in afib clinic, he is now been on anticoagulation x 2 weeks without interruption and can now schedule cardioversion after another week, he is in agreement.  F/u in afib clinic,9/20, he unfortunately had unsuccessful cardioversion and is back in the clinic to discuss antiarrythmic's.It was decided that he would come into the hospital for Tikosyn after he checked on the price of the drug.  F/u in afib clinic, 10/1, for admission for Tikosyn.  He will get the drug free for the rest  of the year as he had met his out of pocket deductible.   F/u in afib clinic 10/11. He is staying in [place] on [place]. He feels improved. His reduced EF meds have been titrated by Dr. Tamala Julian.  F/u in afib clinic,01/07/19. He is in SR and has been doing well staying in rhythm on dofetilide. He ultimately had his parotid gland successfully removed and did not have any issues with the surgery, he was benign. He had a sleep study and it was positive for OSA. He is suppose to pick up his equipment soon.He has had an echo since restoring SR and has shown normalization of EF.  F/u in afib clinic 10/22, he is staying in [place] with tikosyn. He has had neck surgeries and seeing some improvement in his pain.   F/u in afib clinic, 02/01/20. He continues to stay in [place] with Tikosyn. Continues on eliquis with a CHA2DS2VASc of 2. Had a back injection last week and came off eliquis x 3 days. He is anticipating  a surgery for a rod to be placed in his cervical spine in the near future. Has had both covid shots.  Here in afib clinic 08/02/20,  for dofetilide screening. He reports that he  is doing well without any afib burden. EKG shows SR with stable qt. He also reports have renal CA and had Rt partial nephrectomy in May. It was felt the surgery  was successful and he did not have to have any f/u radiation or  Chemo. He remained in SR during the surgery. He has lost around 39 lbs form the surgery, mostly as he did not have much appetite for several weeks following surgery.   F/u in afib clinic, 01/30/21. He has not noted any afib. Remains on tikosyn. He feels well. No changes in health. 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 No issues  with eliquis for a CHA2DS2VASc score of 2.   Today, he denies symptoms of palpitations, chest pain, shortness of breath, orthopnea, PND, lower extremity edema, dizziness, presyncope, syncope, or neurologic sequela.  The patient is  tolerating medications without difficulties and is otherwise without complaint today.   Past Medical History:  Diagnosis Date  . Arthritis   . Atrial fibrillation ([employer]) 07/2018  . Cancer ([place])    skin Ca on nose,kidney  . Chronic kidney disease   . Complication of anesthesia    diff. waking up  . Diabetes mellitus   . Dysrhythmia 07/2018   A-Fib  . Headache(784.0)   . Hypertension    dr Marisue Humble   pcp  . Sleep apnea    prior to weight lose surgery-Dr. Don Perking not use CPAP   Past Surgical History:  Procedure Laterality Date  . APPENDECTOMY    . BACK SURGERY     5  back surgeries  . CARDIAC CATHETERIZATION    . CARDIOVERSION N/A 08/13/2018   Procedure: CARDIOVERSION;  Surgeon: Larey Dresser, MD;  Location: [facility] ENDOSCOPY;  Service: Cardiovascular;  Laterality: N/A;  . CARDIOVERSION N/A 09/02/2018   Procedure: CARDIOVERSION;  Surgeon: Thayer Headings, MD;  Location: [place];  Service: Cardiovascular;  Laterality: N/A;  . CARPAL TUNNEL RELEASE     bil  . COLON SURGERY     2004 for colon rupture  . HERNIA REPAIR    . LAPAROSCOPIC GASTRIC BANDING    . LEFT HEART CATH AND CORONARY ANGIOGRAPHY N/A 07/21/2018   Procedure: LEFT HEART CATH AND CORONARY ANGIOGRAPHY;  Surgeon: Belva Crome, MD;  Location: [place] [facility] LAB;  Service: Cardiovascular;  Laterality: N/A;  . MANDIBLE FRACTURE SURGERY     x 2  . PAROTIDECTOMY Right 11/19/2018   Procedure: PAROTIDECTOMY;  Surgeon: Melida Quitter, MD;  Location: [place];  Service: ENT;  Laterality: Right;  . ROBOTIC ASSITED PARTIAL NEPHRECTOMY Right 04/04/2020   Procedure: XI ROBOTIC ASSITED PARTIAL NEPHRECTOMYINTRAOPATIVE ULTRASOUND;  Surgeon: Alexis Frock, MD;  Location: [facility];  Service: Urology;  Laterality: Right;  3.5 HRS  . TONSILLECTOMY      Current Outpatient Medications  Medication Sig Dispense Refill  . atorvastatin (LIPITOR) 10 MG tablet Take 10 mg by mouth at bedtime.   1  . B-D UF III MINI PEN NEEDLES 31G X 5  MM MISC USE WITH VICTOZA AS DIRECTED ONCE A DAY    . carvedilol (COREG) 25 MG tablet TAKE 0.5 TABLETS (12.5 MG TOTAL) BY MOUTH IN THE MORNING AND AT BEDTIME. 90 tablet 3  . dofetilide (TIKOSYN) 500 MCG capsule TAKE 1 CAPSULE (500 MCG TOTAL) BY MOUTH 2 (TWO) TIMES DAILY. 180 capsule 2  . ELIQUIS 5 MG TABS tablet TAKE 1 TABLET BY MOUTH TWICE A DAY 60 tablet 6  . glimepiride (AMARYL) 2 MG tablet Take 2 mg by mouth daily with breakfast.    . glucose blood test strip USE TO TEST BLOOD SUGAR THREE  TIMES A DAY    . HYDROcodone-acetaminophen (NORCO) 10-325 MG tablet Take 1 tablet by mouth every 4 (four) hours as needed (for severe back pain.).    [name] liraglutide (VICTOZA) 18 MG/3ML SOPN Inject 1.8 mg into the skin every evening.     . magnesium gluconate (MAGONATE) 500 MG tablet Take 500 mg by mouth 2 (two) times daily.    . metFORMIN (GLUCOPHAGE-XR) 500 MG 24 hr tablet Take 1,000 mg by mouth 2 (two) times daily.  1  . Multiple Vitamin (ONE DAILY MULTIVITAMIN ADULT PO) Take 1 tablet by mouth daily.    . Multiple Vitamins-Minerals (PRESERVISION AREDS PO) Take 1 capsule by mouth 2 (two) times daily.    . sacubitril-valsartan (ENTRESTO) 49-51 MG Take 1 tablet by mouth 2 (two) times daily. 180 tablet 3  . spironolactone (ALDACTONE) 25 MG tablet TAKE 1/2 TABLET BY MOUTH EVERY DAY 45 tablet 3  . topiramate (TOPAMAX) 25 MG tablet Take 25 mg by mouth 2 (two) times daily.      No current facility-administered medications for this encounter.    Allergies  Allergen Reactions  . Penicillins Hives    Has patient had a PCN reaction causing immediate rash, facial/tongue/throat swelling, SOB or lightheadedness with hypotension: Unknown Has patient had a PCN reaction causing severe rash involving mucus membranes or skin necrosis: Unknown Has patient had a PCN reaction that required hospitalization: No Has patient had a PCN reaction occurring within the last 10 years: No Childhood reaction. If all of the above  answers are "NO", then may proceed with Cephalosporin use.   [name] Percocet [Oxycodone-Acetaminophen] Anxiety and Other (See Comments)    Hyperactivity & unhinged. Previously tolerated hydrocodone  . Codeine Anxiety    Social History   Socioeconomic History  . Marital status: Married    Spouse name: Not on file  . Number of children: Not on file  . Years of education: Not on file  . Highest education level: Not on file  Occupational History  . Not on file  Tobacco Use  . Smoking status: Former Smoker    Packs/day: 0.90    Years: 15.00    Pack years: 13.50    Types: Cigarettes    Quit date: 01/19/2006    Years since quitting: 15.0  . Smokeless tobacco: Never Used  Vaping Use  . Vaping Use: Never used  Substance and Sexual Activity  . Alcohol use: No  . Drug use: No  . Sexual activity: Not on file  Other Topics Concern  . Not on file  Social History Narrative  . Not on file   Social Determinants of Health   Financial Resource Strain: Not on file  Food Insecurity: Not on file  Transportation Needs: Not on file  Physical Activity: Not on file  Stress: Not on file  Social Connections: Not on file  Intimate Partner Violence: Not on file    Family History  Problem Relation Age of Onset  . Diabetes Maternal Grandfather   . Heart disease Neg Hx     ROS- All systems are reviewed and negative except as per the HPI above  Physical Exam: Vitals:   01/30/21 1004  BP: 134/72  Pulse: (!) 50  Weight: 99.2 kg  Height: 5' 10" (1.778 m)   Wt Readings from Last 3 Encounters:  01/30/21 99.2 kg  08/24/20 97.4 kg  08/02/20 97.9 kg    Labs: Lab Results  Component Value Date   NA 140 08/02/2020   K 4.5  08/02/2020   CL 106 08/02/2020   CO2 24 08/02/2020   GLUCOSE 101 (H) 08/02/2020   BUN 21 08/02/2020   CREATININE 1.12 08/02/2020   CALCIUM 9.7 08/02/2020   MG 2.3 08/02/2020   Lab Results  Component Value Date   INR 1.11 11/19/2018   Lab Results  Component  Value Date   CHOL 129 12/08/2018   HDL 45 12/08/2018   LDLCALC 66 12/08/2018   TRIG 89 12/08/2018     GEN- The patient is well appearing, alert and oriented x 3 today.   Head- normocephalic, atraumatic Eyes-  Sclera clear, conjunctiva pink Ears- hearing intact Oropharynx- clear Neck- supple, no JVP Lymph- no cervical lymphadenopathy Lungs- Clear to ausculation bilaterally, normal work of breathing Heart-regular rate and rhythm, no murmurs, rubs or gallops, PMI not laterally displaced GI- soft, NT, ND, + BS Extremities- no clubbing, cyanosis, or edema MS- no significant deformity or atrophy Skin- no rash or lesion Psych- euthymic mood, full affect Neuro- strength and sensation are intact  EKG- NSR at 50 bpm, pr int 190, qrs int 102 ms, qtc 395 ms Epic records reviewed Echo-Study Conclusions  - Left ventricle: The cavity size was mildly dilated. Wall   thickness was increased in a pattern of mild LVH. Systolic   function was moderately to severely reduced. The estimated   ejection fraction was in the range of 30% to 35%. Diffuse   hypokinesis. - Aortic valve: There was trivial regurgitation. - Mitral valve: There was mild regurgitation.  Impressions: Echo- 11/2019-Study Conclusions  - Left ventricle: The cavity size was normal. Systolic function was   normal. Wall motion was normal; there were no regional wall   motion abnormalities. Doppler parameters are consistent with   abnormal left ventricular relaxation (grade 1 diastolic   dysfunction). Doppler parameters are consistent with   indeterminate ventricular filling pressure. - Aortic valve: Transvalvular velocity was within the normal range.   There was no stenosis. There was mild regurgitation. - Aorta: Ascending aortic diameter: 37 mm (S). - Ascending aorta: The ascending aorta was mildly dilated. - Mitral valve: Transvalvular velocity was within the normal range.   There was no evidence for stenosis. There  was no regurgitation. - Right ventricle: The cavity size was normal. Wall thickness was   normal. Systolic function was normal. - Atrial septum: No defect or patent foramen ovale was identified. - Tricuspid valve: Transvalvular velocity was within the normal   range. There was no regurgitation. - Pulmonary arteries: Systolic pressure could not be accurately   estimated. - Pericardium, extracardiac: A trivial pericardial effusion was   identified. - Global longitudinal strain -15.1% (mildly abnormal).   LHC- 8/21-Moderate three-vessel coronary artery disease.  60 to 70% mid RCA and 95% stenosis of the distal right coronary beyond the origin of the PDA.  Normal left main  Mid LAD 60 to 70% stenosis beyond the origin of the dominant first diagonal.  Large ramus intermedius with luminal irregularity.  Relatively small distribution circumflex with 70% obstruction in the first marginal and total occlusion of the distal circumflex before the origin of the small second obtuse marginal.  There are faint left to left collaterals to the distal circumflex.  Moderately severe left ventricular dysfunction with global hypokinesis, EF 35%.  Normal LVEDP.    RECOMMENDATIONS:   In absence of significant/limiting anginal symptoms, would recommend anti-ischemic therapy with beta-blockade and long-acting nitrates.  If symptoms develop or become refractory PCI of the very distal RCA, and LAD would  be possible.  Lesions were not angiographically critical, and therefore based on data from the Courage trial, medical therapy would seem to be adequate.  Recommend guideline directed therapy for systolic heart failure: Transition to Entresto from ARB, transition Tenormin to carvedilol, add mineralocorticoid receptor antagonist (Aldactone or eplerenone).  With reference to heart failure, diabetes management should include an SGLT 2 agent to decrease incidence of heart failure symptoms.  Finally it may be  helpful to have restoration of sinus rhythm.  LV dysfunction is out of proportion to the degree of coronary disease.   Recommend Aspirin 60m daily for moderate CAD.  Assessment and Plan: 1. Persistent  Afib Staying in rhythm with dofetilide Continue drug at 500 mcg bid Qtc stable Bmet/mag today Continue  OSA, on bipap  2.CHA2DS2VASc score of 3(htn,CAD,  DM)  Continue eliquis 5 mg bid   3. CAD / LV dysfunction  EF has normalized with restoring SR No anginal symptoms   4. Rt renal CA S/p surgery 03/2020 Per oncology   F/u here in 6 months for tikosyn surveillance   F/u with Dr. STamala Julianas scheduled   DGeroge Baseman [place], [facility]GLeonard Clymer [phone]

## 2021-02-06 ENCOUNTER — Other Ambulatory Visit: Payer: Self-pay | Admitting: Neurosurgery

## 2021-02-06 DIAGNOSIS — M25552 Pain in left hip: Secondary | ICD-10-CM

## 2021-02-09 NOTE — Progress Notes (Signed)
Cardiology Office Note:    Date:  02/11/2021   ID:  Casey Garbe., DOB 11/22/1956, MRN [id number]  PCP:  Orpah Melter, MD  Cardiologist:  Sinclair Grooms, MD   Referring MD: Orpah Melter, MD   Chief Complaint  Patient presents with  . Congestive Heart Failure    History of Present Illness:    Casey D Deondra Wigger. is a 65 y.o. male with a hx of  atrial fibrillation,systolic left ventricular dysfunction with EF less than 35%-->65% after conversion and addition of Tikosyn and GDMT for HF, and further w/u showed no obstructive CAD.  Doing well, asymptomatic.  Followed in A. fib clinic by Roderic Palau (Dr. Thompson Grayer).  Casey [name] denies cardiac symptoms.  Casey [name] never knew when atrial fibrillation was present.  Casey [name] was identified because Casey [name] had low EF and was noted to have atrial fibrillation preop for a kidney operation.  Past Medical History:  Diagnosis Date  . Arthritis   . Atrial fibrillation ([place]) 07/2018  . Cancer ([place])    skin Ca on nose,kidney  . Chronic kidney disease   . Complication of anesthesia    diff. waking up  . Diabetes mellitus   . Dysrhythmia 07/2018   A-Fib  . Headache(784.0)   . Hypertension    dr Marisue Humble   pcp  . Sleep apnea    prior to weight lose surgery-Dr. Don Perking not use CPAP    Past Surgical History:  Procedure Laterality Date  . APPENDECTOMY    . BACK SURGERY     5  back surgeries  . CARDIAC CATHETERIZATION    . CARDIOVERSION N/A 08/13/2018   Procedure: CARDIOVERSION;  Surgeon: Larey Dresser, MD;  Location: [facility] ENDOSCOPY;  Service: Cardiovascular;  Laterality: N/A;  . CARDIOVERSION N/A 09/02/2018   Procedure: CARDIOVERSION;  Surgeon: Thayer Headings, MD;  Location: [place];  Service: Cardiovascular;  Laterality: N/A;  . CARPAL TUNNEL RELEASE     bil  . COLON SURGERY     2004 for colon rupture  . HERNIA REPAIR    . LAPAROSCOPIC GASTRIC BANDING    . LEFT HEART CATH AND CORONARY ANGIOGRAPHY N/A 07/21/2018   Procedure:  LEFT HEART CATH AND CORONARY ANGIOGRAPHY;  Surgeon: Belva Crome, MD;  Location: [place] CV LAB;  Service: Cardiovascular;  Laterality: N/A;  . MANDIBLE FRACTURE SURGERY     x 2  . PAROTIDECTOMY Right 11/19/2018   Procedure: PAROTIDECTOMY;  Surgeon: Melida Quitter, MD;  Location: [place];  Service: ENT;  Laterality: Right;  . ROBOTIC ASSITED PARTIAL NEPHRECTOMY Right 04/04/2020   Procedure: XI ROBOTIC ASSITED PARTIAL NEPHRECTOMYINTRAOPATIVE ULTRASOUND;  Surgeon: Alexis Frock, MD;  Location: [facility];  Service: Urology;  Laterality: Right;  3.5 HRS  . TONSILLECTOMY      Current Medications: Current Meds  Medication Sig  . atorvastatin (LIPITOR) 10 MG tablet Take 10 mg by mouth at bedtime.   . B-D UF III MINI PEN NEEDLES 31G X 5 MM MISC USE WITH VICTOZA AS DIRECTED ONCE A DAY  . carvedilol (COREG) 25 MG tablet TAKE 0.5 TABLETS (12.5 MG TOTAL) BY MOUTH IN THE MORNING AND AT BEDTIME.  [name] dofetilide (TIKOSYN) 500 MCG capsule TAKE 1 CAPSULE (500 MCG TOTAL) BY MOUTH 2 (TWO) TIMES DAILY.  [name] ELIQUIS 5 MG TABS tablet TAKE 1 TABLET BY MOUTH TWICE A DAY  . glimepiride (AMARYL) 2 MG tablet Take 2 mg by mouth daily with breakfast.  . glucose blood test strip USE  TO TEST BLOOD SUGAR THREE TIMES A DAY  . HYDROcodone-acetaminophen (NORCO) 10-325 MG tablet Take 1 tablet by mouth every 4 (four) hours as needed (for severe back pain.).  [name] liraglutide (VICTOZA) 18 MG/3ML SOPN Inject 1.8 mg into the skin every evening.   . magnesium gluconate (MAGONATE) 500 MG tablet Take 500 mg by mouth 2 (two) times daily.  . metFORMIN (GLUCOPHAGE-XR) 500 MG 24 hr tablet Take 1,000 mg by mouth 2 (two) times daily.  . Multiple Vitamin (ONE DAILY MULTIVITAMIN ADULT PO) Take 1 tablet by mouth daily.  . Multiple Vitamins-Minerals (PRESERVISION AREDS PO) Take 1 capsule by mouth 2 (two) times daily.  . sacubitril-valsartan (ENTRESTO) 49-51 MG Take 1 tablet by mouth 2 (two) times daily.  [name] spironolactone (ALDACTONE) 25 MG tablet  TAKE 1/2 TABLET BY MOUTH EVERY DAY  . topiramate (TOPAMAX) 25 MG tablet Take 25 mg by mouth 2 (two) times daily.      Allergies:   Penicillins, Percocet [oxycodone-acetaminophen], and Codeine   Social History   Socioeconomic History  . Marital status: Married    Spouse name: Not on file  . Number of children: Not on file  . Years of education: Not on file  . Highest education level: Not on file  Occupational History  . Not on file  Tobacco Use  . Smoking status: Former Smoker    Packs/day: 0.90    Years: 15.00    Pack years: 13.50    Types: Cigarettes    Quit date: 01/19/2006    Years since quitting: 15.0  . Smokeless tobacco: Never Used  Vaping Use  . Vaping Use: Never used  Substance and Sexual Activity  . Alcohol use: No  . Drug use: No  . Sexual activity: Not on file  Other Topics Concern  . Not on file  Social History Narrative  . Not on file   Social Determinants of Health   Financial Resource Strain: Not on file  Food Insecurity: Not on file  Transportation Needs: Not on file  Physical Activity: Not on file  Stress: Not on file  Social Connections: Not on file     Family History: The patient's family history includes Diabetes in his maternal grandfather. There is no history of Heart disease.  ROS:   Please see the history of present illness.    Appetite is stable.  Casey [name] wants to continue to lose weight.  All other systems reviewed and are negative.  EKGs/Labs/Other Studies Reviewed:    The following studies were reviewed today: Last echo January 2020.  EKG:  EKG not repeated  Recent Labs: 02/20/2020: ALT 17 03/30/2020: Platelets 231 04/05/2020: Hemoglobin 13.2 01/30/2021: BUN 31; Creatinine, Ser 1.21; Magnesium 2.4; Potassium 4.7; Sodium 137  Recent Lipid Panel    Component Value Date/Time   CHOL 129 12/08/2018 1113   TRIG 89 12/08/2018 1113   HDL 45 12/08/2018 1113   CHOLHDL 2.9 12/08/2018 1113   LDLCALC 66 12/08/2018 1113    Physical Exam:     VS:  BP 120/70   Pulse (!) 55   Ht 5\' 10"  (1.778 m)   Wt 218 lb (98.9 kg)   SpO2 98%   BMI 31.28 kg/m     Wt Readings from Last 3 Encounters:  02/11/21 218 lb (98.9 kg)  01/30/21 218 lb 12.8 oz (99.2 kg)  08/24/20 214 lb 12.8 oz (97.4 kg)     GEN: Obese. No acute distress HEENT: Normal NECK: No JVD. LYMPHATICS: No lymphadenopathy CARDIAC: No  murmur. RRR no gallop, or edema. VASCULAR:  Normal Pulses. No bruits. RESPIRATORY:  Clear to auscultation without rales, wheezing or rhonchi  ABDOMEN: Soft, non-tender, non-distended, No pulsatile mass, MUSCULOSKELETAL: No deformity  SKIN: Warm and dry NEUROLOGIC:  Alert and oriented x 3 PSYCHIATRIC:  Normal affect   ASSESSMENT:    1. Persistent atrial fibrillation ([place])   2. Coronary artery disease involving native coronary artery of native heart without angina pectoris   3. Chronic systolic heart failure ([place])   4. Hyperlipidemia with target LDL less than 70   5. Secondary hypercoagulable state ([place])   6. Obstructive sleep apnea (adult) (pediatric)   7. Uncontrolled type 2 diabetes mellitus with hyperglycemia, without long-term current use of insulin (HCC)    PLAN:    In order of problems listed above:  1. Maintaining sinus rhythm based upon exam.  Continue Eliquis and Tikosyn. 2. No anginal complaints 3. 2D Doppler echocardiogram to reassess LV function now 2 years post normalization. 4. Continue Lipitor with target LDL less than 70. 5. Continue Eliquis 6. Continue CPAP  Follow-up in 9 months  Medication Adjustments/Labs and Tests Ordered: Current medicines are reviewed at length with the patient today.  Concerns regarding medicines are outlined above.  No orders of the defined types were placed in this encounter.  No orders of the defined types were placed in this encounter.   There are no Patient Instructions on file for this visit.   Signed, Sinclair Grooms, MD  02/11/2021 10:23 AM    [place]

## 2021-02-11 ENCOUNTER — Encounter: Payer: Self-pay | Admitting: Interventional Cardiology

## 2021-02-11 ENCOUNTER — Ambulatory Visit (INDEPENDENT_AMBULATORY_CARE_PROVIDER_SITE_OTHER): Payer: PRIVATE HEALTH INSURANCE | Admitting: Interventional Cardiology

## 2021-02-11 ENCOUNTER — Other Ambulatory Visit: Payer: Self-pay

## 2021-02-11 VITALS — BP 120/70 | HR 55 | Ht 70.0 in | Wt 218.0 lb

## 2021-02-11 DIAGNOSIS — G4733 Obstructive sleep apnea (adult) (pediatric): Secondary | ICD-10-CM | POA: Diagnosis not present

## 2021-02-11 DIAGNOSIS — I2510 Atherosclerotic heart disease of native coronary artery without angina pectoris: Secondary | ICD-10-CM

## 2021-02-11 DIAGNOSIS — E1165 Type 2 diabetes mellitus with hyperglycemia: Secondary | ICD-10-CM

## 2021-02-11 DIAGNOSIS — E785 Hyperlipidemia, unspecified: Secondary | ICD-10-CM

## 2021-02-11 DIAGNOSIS — I4819 Other persistent atrial fibrillation: Secondary | ICD-10-CM | POA: Diagnosis not present

## 2021-02-11 DIAGNOSIS — I5022 Chronic systolic (congestive) heart failure: Secondary | ICD-10-CM

## 2021-02-11 DIAGNOSIS — D6869 Other thrombophilia: Secondary | ICD-10-CM | POA: Diagnosis not present

## 2021-02-11 NOTE — Patient Instructions (Signed)
Medication Instructions:  Your physician recommends that you continue on your current medications as directed. Please refer to the Current Medication list given to you today.  *If you need a refill on your cardiac medications before your next appointment, please call your pharmacy*   Lab Work: None If you have labs (blood work) drawn today and your tests are completely normal, you will receive your results only by: [name] MyChart Message (if you have MyChart) OR . A paper copy in the mail If you have any lab test that is abnormal or we need to change your treatment, we will call you to review the results.   Testing/Procedures: Your physician has requested that you have an echocardiogram. Echocardiography is a painless test that uses sound waves to create images of your heart. It provides your doctor with information about the size and shape of your heart and how well your heart's chambers and valves are working. This procedure takes approximately one hour. There are no restrictions for this procedure.    Follow-Up: At [facility], you and your health needs are our priority.  As part of our continuing mission to provide you with exceptional heart care, we have created designated Provider Care Teams.  These Care Teams include your primary Cardiologist (physician) and Advanced Practice Providers (APPs -  Physician Assistants and Nurse Practitioners) who all work together to provide you with the care you need, when you need it.  We recommend signing up for the patient portal called "MyChart".  Sign up information is provided on this After Visit Summary.  MyChart is used to connect with patients for Virtual Visits (Telemedicine).  Patients are able to view lab/test results, encounter notes, upcoming appointments, etc.  Non-urgent messages can be sent to your provider as well.   To learn more about what you can do with MyChart, go to [email or web address].    Your next appointment:   9  month(s)  The format for your next appointment:   In Person  Provider:   You may see Sinclair Grooms, MD or one of the following Advanced Practice Providers on your designated Care Team:    Kathyrn Drown, NP    Other Instructions

## 2021-02-23 ENCOUNTER — Other Ambulatory Visit (HOSPITAL_COMMUNITY): Payer: Self-pay | Admitting: Nurse Practitioner

## 2021-02-24 ENCOUNTER — Ambulatory Visit
Admission: RE | Admit: 2021-02-24 | Discharge: 2021-02-24 | Disposition: A | Discharge status: Home / Self Care | Payer: PRIVATE HEALTH INSURANCE | Source: Ambulatory Visit | Attending: Neurosurgery | Admitting: Neurosurgery

## 2021-02-24 ENCOUNTER — Other Ambulatory Visit: Payer: Self-pay

## 2021-02-24 DIAGNOSIS — M1612 Unilateral primary osteoarthritis, left hip: Secondary | ICD-10-CM | POA: Diagnosis not present

## 2021-02-24 DIAGNOSIS — M25552 Pain in left hip: Secondary | ICD-10-CM

## 2021-02-24 DIAGNOSIS — S76312A Strain of muscle, fascia and tendon of the posterior muscle group at thigh level, left thigh, initial encounter: Secondary | ICD-10-CM | POA: Diagnosis not present

## 2021-02-24 DIAGNOSIS — Z9889 Other specified postprocedural states: Secondary | ICD-10-CM | POA: Diagnosis not present

## 2021-02-25 ENCOUNTER — Other Ambulatory Visit: Payer: Self-pay | Admitting: Nurse Practitioner

## 2021-03-07 ENCOUNTER — Ambulatory Visit (HOSPITAL_COMMUNITY): Payer: PRIVATE HEALTH INSURANCE | Attending: Cardiovascular Disease

## 2021-03-07 ENCOUNTER — Other Ambulatory Visit: Payer: Self-pay

## 2021-03-07 DIAGNOSIS — I5022 Chronic systolic (congestive) heart failure: Secondary | ICD-10-CM

## 2021-03-07 LAB — ECHOCARDIOGRAM COMPLETE
Area-P 1/2: 3.72 cm2
P 1/2 time: 661 msec
S' Lateral: 2.6 cm

## 2021-03-12 DIAGNOSIS — M5124 Other intervertebral disc displacement, thoracic region: Secondary | ICD-10-CM | POA: Diagnosis not present

## 2021-03-12 DIAGNOSIS — M961 Postlaminectomy syndrome, not elsewhere classified: Secondary | ICD-10-CM | POA: Diagnosis not present

## 2021-03-12 DIAGNOSIS — M25552 Pain in left hip: Secondary | ICD-10-CM | POA: Diagnosis not present

## 2021-03-15 ENCOUNTER — Telehealth: Payer: Self-pay | Admitting: Interventional Cardiology

## 2021-03-15 DIAGNOSIS — I77819 Aortic ectasia, unspecified site: Secondary | ICD-10-CM

## 2021-03-15 NOTE — Telephone Encounter (Signed)
Patient was returning call for results 

## 2021-03-15 NOTE — Telephone Encounter (Signed)
Casey Crome, MD  Loren Racer, RN Let the patient know the aortic valve leaks moderately. The aorta is dilated. The heart strength is normal. This will need to be monitored over time for progression. Will probably also need to have a CT scan with contrast to document the actual size of the aorta. 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    Spoke with pt and reviewed results and recommendations.  Pt agreeable to plan.

## 2021-04-02 ENCOUNTER — Other Ambulatory Visit: Payer: Self-pay

## 2021-04-02 ENCOUNTER — Ambulatory Visit
Admission: RE | Admit: 2021-04-02 | Discharge: 2021-04-02 | Disposition: A | Discharge status: Home / Self Care | Payer: PRIVATE HEALTH INSURANCE | Source: Ambulatory Visit | Attending: Interventional Cardiology | Admitting: Interventional Cardiology

## 2021-04-02 DIAGNOSIS — I77819 Aortic ectasia, unspecified site: Secondary | ICD-10-CM

## 2021-04-02 DIAGNOSIS — J984 Other disorders of lung: Secondary | ICD-10-CM | POA: Diagnosis not present

## 2021-04-02 DIAGNOSIS — I700 Atherosclerosis of aorta: Secondary | ICD-10-CM | POA: Diagnosis not present

## 2021-04-02 MED ORDER — IOPAMIDOL (ISOVUE-370) INJECTION 76%
75.0000 mL | Freq: Once | INTRAVENOUS | Status: AC | PRN
  Administered 2021-04-02: 75 mL via INTRAVENOUS

## 2021-04-05 ENCOUNTER — Telehealth: Payer: Self-pay | Admitting: Interventional Cardiology

## 2021-04-05 NOTE — Telephone Encounter (Signed)
Patient is returning call to discuss CT results. 

## 2021-04-05 NOTE — Telephone Encounter (Signed)
Called patient, notified him of Cardiac CT results.  All questions were answered.  He verbalizes understanding.

## 2021-06-20 DIAGNOSIS — H5213 Myopia, bilateral: Secondary | ICD-10-CM | POA: Diagnosis not present

## 2021-06-20 DIAGNOSIS — H524 Presbyopia: Secondary | ICD-10-CM | POA: Diagnosis not present

## 2021-06-20 DIAGNOSIS — H2513 Age-related nuclear cataract, bilateral: Secondary | ICD-10-CM | POA: Diagnosis not present

## 2021-06-20 DIAGNOSIS — E119 Type 2 diabetes mellitus without complications: Secondary | ICD-10-CM | POA: Diagnosis not present

## 2021-06-25 DIAGNOSIS — Z87442 Personal history of urinary calculi: Secondary | ICD-10-CM | POA: Diagnosis not present

## 2021-06-25 DIAGNOSIS — R1084 Generalized abdominal pain: Secondary | ICD-10-CM | POA: Diagnosis not present

## 2021-06-25 DIAGNOSIS — R3915 Urgency of urination: Secondary | ICD-10-CM | POA: Diagnosis not present

## 2021-06-27 DIAGNOSIS — M47812 Spondylosis without myelopathy or radiculopathy, cervical region: Secondary | ICD-10-CM | POA: Diagnosis not present

## 2021-06-27 DIAGNOSIS — R519 Headache, unspecified: Secondary | ICD-10-CM | POA: Diagnosis not present

## 2021-06-27 DIAGNOSIS — M533 Sacrococcygeal disorders, not elsewhere classified: Secondary | ICD-10-CM | POA: Diagnosis not present

## 2021-06-27 DIAGNOSIS — M961 Postlaminectomy syndrome, not elsewhere classified: Secondary | ICD-10-CM | POA: Diagnosis not present

## 2021-06-27 DIAGNOSIS — M25552 Pain in left hip: Secondary | ICD-10-CM | POA: Diagnosis not present

## 2021-07-02 DIAGNOSIS — Z9884 Bariatric surgery status: Secondary | ICD-10-CM | POA: Diagnosis not present

## 2021-07-02 DIAGNOSIS — R109 Unspecified abdominal pain: Secondary | ICD-10-CM | POA: Diagnosis not present

## 2021-07-02 DIAGNOSIS — R1084 Generalized abdominal pain: Secondary | ICD-10-CM | POA: Diagnosis not present

## 2021-07-02 DIAGNOSIS — Z981 Arthrodesis status: Secondary | ICD-10-CM | POA: Diagnosis not present

## 2021-07-02 DIAGNOSIS — I700 Atherosclerosis of aorta: Secondary | ICD-10-CM | POA: Diagnosis not present

## 2021-07-02 DIAGNOSIS — Z87442 Personal history of urinary calculi: Secondary | ICD-10-CM | POA: Diagnosis not present

## 2021-07-16 DIAGNOSIS — Z1329 Encounter for screening for other suspected endocrine disorder: Secondary | ICD-10-CM | POA: Diagnosis not present

## 2021-07-16 DIAGNOSIS — I5022 Chronic systolic (congestive) heart failure: Secondary | ICD-10-CM | POA: Diagnosis not present

## 2021-07-16 DIAGNOSIS — E291 Testicular hypofunction: Secondary | ICD-10-CM | POA: Diagnosis not present

## 2021-07-16 DIAGNOSIS — E1121 Type 2 diabetes mellitus with diabetic nephropathy: Secondary | ICD-10-CM | POA: Diagnosis not present

## 2021-07-16 DIAGNOSIS — I700 Atherosclerosis of aorta: Secondary | ICD-10-CM | POA: Diagnosis not present

## 2021-07-16 DIAGNOSIS — I10 Essential (primary) hypertension: Secondary | ICD-10-CM | POA: Diagnosis not present

## 2021-07-16 DIAGNOSIS — D6869 Other thrombophilia: Secondary | ICD-10-CM | POA: Diagnosis not present

## 2021-07-16 DIAGNOSIS — E7800 Pure hypercholesterolemia, unspecified: Secondary | ICD-10-CM | POA: Diagnosis not present

## 2021-07-16 DIAGNOSIS — K279 Peptic ulcer, site unspecified, unspecified as acute or chronic, without hemorrhage or perforation: Secondary | ICD-10-CM | POA: Diagnosis not present

## 2021-07-16 DIAGNOSIS — I4891 Unspecified atrial fibrillation: Secondary | ICD-10-CM | POA: Diagnosis not present

## 2021-07-16 DIAGNOSIS — Z125 Encounter for screening for malignant neoplasm of prostate: Secondary | ICD-10-CM | POA: Diagnosis not present

## 2021-08-07 ENCOUNTER — Other Ambulatory Visit: Payer: Self-pay

## 2021-08-07 ENCOUNTER — Ambulatory Visit (HOSPITAL_COMMUNITY)
Admission: RE | Admit: 2021-08-07 | Discharge: 2021-08-07 | Disposition: A | Discharge status: Home / Self Care | Payer: PRIVATE HEALTH INSURANCE | Source: Ambulatory Visit | Attending: Nurse Practitioner | Admitting: Nurse Practitioner

## 2021-08-07 ENCOUNTER — Encounter (HOSPITAL_COMMUNITY): Payer: Self-pay | Admitting: Nurse Practitioner

## 2021-08-07 VITALS — BP 150/78 | HR 67 | Ht 70.0 in | Wt 222.2 lb

## 2021-08-07 DIAGNOSIS — Z888 Allergy status to other drugs, medicaments and biological substances status: Secondary | ICD-10-CM | POA: Diagnosis not present

## 2021-08-07 DIAGNOSIS — I4819 Other persistent atrial fibrillation: Secondary | ICD-10-CM | POA: Diagnosis not present

## 2021-08-07 DIAGNOSIS — G4733 Obstructive sleep apnea (adult) (pediatric): Secondary | ICD-10-CM | POA: Diagnosis not present

## 2021-08-07 DIAGNOSIS — Z880 Allergy status to penicillin: Secondary | ICD-10-CM | POA: Diagnosis not present

## 2021-08-07 DIAGNOSIS — N189 Chronic kidney disease, unspecified: Secondary | ICD-10-CM | POA: Diagnosis not present

## 2021-08-07 DIAGNOSIS — Z79899 Other long term (current) drug therapy: Secondary | ICD-10-CM | POA: Diagnosis not present

## 2021-08-07 DIAGNOSIS — Z9884 Bariatric surgery status: Secondary | ICD-10-CM | POA: Diagnosis not present

## 2021-08-07 DIAGNOSIS — Z7901 Long term (current) use of anticoagulants: Secondary | ICD-10-CM | POA: Diagnosis not present

## 2021-08-07 DIAGNOSIS — Z7984 Long term (current) use of oral hypoglycemic drugs: Secondary | ICD-10-CM | POA: Diagnosis not present

## 2021-08-07 DIAGNOSIS — Z87891 Personal history of nicotine dependence: Secondary | ICD-10-CM | POA: Diagnosis not present

## 2021-08-07 DIAGNOSIS — D6869 Other thrombophilia: Secondary | ICD-10-CM | POA: Diagnosis not present

## 2021-08-07 DIAGNOSIS — E669 Obesity, unspecified: Secondary | ICD-10-CM | POA: Insufficient documentation

## 2021-08-07 DIAGNOSIS — E1122 Type 2 diabetes mellitus with diabetic chronic kidney disease: Secondary | ICD-10-CM | POA: Diagnosis not present

## 2021-08-07 DIAGNOSIS — I130 Hypertensive heart and chronic kidney disease with heart failure and stage 1 through stage 4 chronic kidney disease, or unspecified chronic kidney disease: Secondary | ICD-10-CM | POA: Insufficient documentation

## 2021-08-07 DIAGNOSIS — I2510 Atherosclerotic heart disease of native coronary artery without angina pectoris: Secondary | ICD-10-CM | POA: Insufficient documentation

## 2021-08-07 DIAGNOSIS — Z85528 Personal history of other malignant neoplasm of kidney: Secondary | ICD-10-CM | POA: Diagnosis not present

## 2021-08-07 LAB — CBC
HCT: 42.3 % (ref 39.0–52.0)
Hemoglobin: 13.5 g/dL (ref 13.0–17.0)
MCH: 29.9 pg (ref 26.0–34.0)
MCHC: 31.9 g/dL (ref 30.0–36.0)
MCV: 93.8 fL (ref 80.0–100.0)
Platelets: 212 10*3/uL (ref 150–400)
RBC: 4.51 MIL/uL (ref 4.22–5.81)
RDW: 13.7 % (ref 11.5–15.5)
WBC: 8.7 10*3/uL (ref 4.0–10.5)
nRBC: 0 % (ref 0.0–0.2)

## 2021-08-07 LAB — MAGNESIUM: Magnesium: 2 mg/dL (ref 1.7–2.4)

## 2021-08-07 NOTE — Progress Notes (Addendum)
Primary Care Physician: Orpah Melter, MD Referring Physician: Dr. Fransico Him   Casey D Juventino Pavone. is a 65 y.o. male with a h/o obesity, s/p gastric sleeve, HTN, DM that presented for pre op for rt parotidectomy 8/8 and was found to be in rate controlled afib, from which he was asymptomatic. He was sent to the afib clinic for evaluation. 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 He has felt some fatigue, less stamina for several months. Was just seen by PCP yesterday and nothing out of the ordinary was noted with his heart rhythm. He denies any exertional chest pain.   He does not drink alcohol, no tobacco use,no excessive caffeine. He has kept his weight stable for several years. Was almost 100 lbs heavier before gastric sleeve.  I discussed with Dr. Rayann Heman and he recommended an echo and if ok, he would be considered at low risk for surgery and could start anticoagulation after surgery. However, pt is now back in the office as his echo did show a reduced EF at 30-35%. Dr. Rayann Heman discussed with Dr. Redmond Baseman and it is felt most appropriate  to delay surgery in order to obtain  LHC to further determine his risk for surgery. He does have cardiac risk factors for CAD , ie , obesity,  HTN, DM. He denies any exertional chest pain but c/o fatigue/ low stamina  for several months/early summer. LHC did show 3 vessel  moderate  CAD  F/u in afib clinic, he is now been on anticoagulation x 2 weeks without interruption and can now schedule cardioversion after another week, he is in agreement.  F/u in afib clinic,9/20, he unfortunately had unsuccessful cardioversion and is back in the clinic to discuss antiarrythmic's.It was decided that he would come into the hospital for Tikosyn after he checked on the price of the drug.  F/u in afib clinic, 10/1, for admission for Tikosyn.  He will get the drug free for the rest  of the year as he had met his out of pocket deductible.   F/u in afib clinic 10/11. He is staying in [place] on [place]. He feels improved. His reduced EF meds have been titrated by Dr. Tamala Julian.  F/u in afib clinic,01/07/19. He is in SR and has been doing well staying in rhythm on dofetilide. He ultimately had his parotid gland successfully removed and did not have any issues with the surgery, he was benign. He had a sleep study and it was positive for OSA. He is suppose to pick up his equipment soon.He has had an echo since restoring SR and has shown normalization of EF.  F/u in afib clinic 10/22, he is staying in [place] with tikosyn. He has had neck surgeries and seeing some improvement in his pain.   F/u in afib clinic, 02/01/20. He continues to stay in [place] with Tikosyn. Continues on eliquis with a CHA2DS2VASc of 2. Had a back injection last week and came off eliquis x 3 days. He is anticipating  a surgery for a rod to be placed in his cervical spine in the near future. Has had both covid shots.  Here in afib clinic 08/02/20,  for dofetilide screening. He reports that he  is doing well without any afib burden. EKG shows SR with stable qt. He also reports have renal CA and had Rt partial nephrectomy in May. It was felt the surgery  was successful and he did not have to have any f/u radiation or  Chemo. He remained in SR during the surgery. He has lost around 39 lbs form the surgery, mostly as he did not have much appetite for several weeks following surgery.   F/u in afib clinic, 01/30/21. He has not noted any afib. Remains on tikosyn. He feels well. No changes in health. 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 No issues  with eliquis for a CHA2DS2VASc score of 2.   F/u afib clinic, 08/07/21. He has not noted any afib. Compliant with Tikosyn and anticoagulation. Passed a kidney stone a while back.   Today, he denies symptoms of palpitations, chest pain,  shortness of breath, orthopnea, PND, lower extremity edema, dizziness, presyncope, syncope, or neurologic sequela.  The patient is tolerating medications without difficulties and is otherwise without complaint today.   Past Medical History:  Diagnosis Date   Arthritis    Atrial fibrillation ([place]) 07/2018   Cancer ([place])    skin Ca on nose,kidney   Chronic kidney disease    Complication of anesthesia    diff. waking up   Diabetes mellitus    Dysrhythmia 07/2018   A-Fib   Headache(784.0)    Hypertension    dr Marisue Humble   pcp   Sleep apnea    prior to weight lose surgery-Dr. Don Perking not use CPAP   Past Surgical History:  Procedure Laterality Date   APPENDECTOMY     BACK SURGERY     5  back surgeries   CARDIAC CATHETERIZATION     CARDIOVERSION N/A 08/13/2018   Procedure: CARDIOVERSION;  Surgeon: Larey Dresser, MD;  Location: [facility] ENDOSCOPY;  Service: Cardiovascular;  Laterality: N/A;   CARDIOVERSION N/A 09/02/2018   Procedure: CARDIOVERSION;  Surgeon: Thayer Headings, MD;  Location: [facility] ENDOSCOPY;  Service: Cardiovascular;  Laterality: N/A;   CARPAL TUNNEL RELEASE     bil   COLON SURGERY     2004 for colon rupture   HERNIA REPAIR     LAPAROSCOPIC GASTRIC BANDING     LEFT HEART CATH AND CORONARY ANGIOGRAPHY N/A 07/21/2018   Procedure: LEFT HEART CATH AND CORONARY ANGIOGRAPHY;  Surgeon: Belva Crome, MD;  Location: [facility] CV LAB;  Service: Cardiovascular;  Laterality: N/A;   MANDIBLE FRACTURE SURGERY     x 2   PAROTIDECTOMY Right 11/19/2018   Procedure: PAROTIDECTOMY;  Surgeon: Melida Quitter, MD;  Location: [place];  Service: ENT;  Laterality: Right;   ROBOTIC ASSITED PARTIAL NEPHRECTOMY Right 04/04/2020   Procedure: XI ROBOTIC ASSITED PARTIAL [place];  Surgeon: Alexis Frock, MD;  Location: [facility] ORS;  Service: Urology;  Laterality: Right;  3.5 HRS   TONSILLECTOMY      Current Outpatient Medications  Medication Sig Dispense Refill    atorvastatin (LIPITOR) 10 MG tablet Take 10 mg by mouth at bedtime.   1   B-D UF III MINI PEN NEEDLES 31G X 5 MM MISC USE WITH VICTOZA AS DIRECTED ONCE A DAY     carvedilol (COREG) 25 MG tablet TAKE 0.5 TABLETS (12.5 MG TOTAL) BY MOUTH IN THE MORNING AND AT BEDTIME. 90 tablet 3   dofetilide (TIKOSYN) 500 MCG capsule TAKE 1 CAPSULE (500 MCG TOTAL) BY MOUTH 2 (TWO) TIMES DAILY. 180 capsule 2   ELIQUIS 5 MG TABS tablet TAKE 1 TABLET BY MOUTH TWICE A DAY 60 tablet 6   glimepiride (AMARYL) 2  MG tablet Take 2 mg by mouth daily with breakfast.     glucose blood test strip USE TO TEST BLOOD SUGAR THREE TIMES A DAY     HYDROcodone-acetaminophen (NORCO) 10-325 MG tablet Take 1 tablet by mouth every 4 (four) hours as needed (for severe back pain.).     liraglutide (VICTOZA) 18 MG/3ML SOPN Inject 1.8 mg into the skin every evening.      magnesium gluconate (MAGONATE) 500 MG tablet Take 500 mg by mouth 2 (two) times daily.     metFORMIN (GLUCOPHAGE-XR) 500 MG 24 hr tablet Take 1,000 mg by mouth 2 (two) times daily.  1   Multiple Vitamin (ONE DAILY MULTIVITAMIN ADULT PO) Take 1 tablet by mouth daily.     Multiple Vitamins-Minerals (PRESERVISION AREDS PO) Take 1 capsule by mouth 2 (two) times daily.     pantoprazole (PROTONIX) 40 MG tablet Take 40 mg by mouth every morning.     sacubitril-valsartan (ENTRESTO) 49-51 MG Take 1 tablet by mouth 2 (two) times daily. 180 tablet 3   spironolactone (ALDACTONE) 25 MG tablet TAKE 1/2 TABLET BY MOUTH EVERY DAY 45 tablet 3   tamsulosin (FLOMAX) 0.4 MG CAPS capsule Take 0.4 mg by mouth daily.     topiramate (TOPAMAX) 25 MG tablet Take 25 mg by mouth 2 (two) times daily.      No current facility-administered medications for this encounter.    Allergies  Allergen Reactions   Penicillins Hives    Has patient had a PCN reaction causing immediate rash, facial/tongue/throat swelling, SOB or lightheadedness with hypotension: Unknown Has patient had a PCN reaction causing  severe rash involving mucus membranes or skin necrosis: Unknown Has patient had a PCN reaction that required hospitalization: No Has patient had a PCN reaction occurring within the last 10 years: No Childhood reaction. If all of the above answers are "NO", then may proceed with Cephalosporin use.    Percocet [Oxycodone-Acetaminophen] Anxiety and Other (See Comments)    Hyperactivity & unhinged. Previously tolerated hydrocodone   Ace Inhibitors Other (See Comments)   Align Prebiotic-Probiotic Other (See Comments)   Gabapentin Other (See Comments)   Lansoprazole Other (See Comments)   Lopressor [Metoprolol] Other (See Comments)    Drops heart rate- caused Ed visit   Rofecoxib Other (See Comments)   Simvastatin Other (See Comments)   Sulfamethoxazole-Trimethoprim Other (See Comments)   Zoloft [Sertraline] Other (See Comments)   Codeine Anxiety    Social History   Socioeconomic History   Marital status: Married    Spouse name: Not on file   Number of children: Not on file   Years of education: Not on file   Highest education level: Not on file  Occupational History   Not on file  Tobacco Use   Smoking status: Former    Packs/day: 0.90    Years: 15.00    Pack years: 13.50    Types: Cigarettes    Quit date: 01/19/2006    Years since quitting: 15.5   Smokeless tobacco: Never  Vaping Use   Vaping Use: Never used  Substance and Sexual Activity   Alcohol use: No   Drug use: No   Sexual activity: Not on file  Other Topics Concern   Not on file  Social History Narrative   Not on file   Social Determinants of Health   Financial Resource Strain: Not on file  Food Insecurity: Not on file  Transportation Needs: Not on file  Physical Activity: Not on file  Stress: Not on file  Social Connections: Not on file  Intimate Partner Violence: Not on file    Family History  Problem Relation Age of Onset   Diabetes Maternal Grandfather    Heart disease Neg Hx     ROS- All  systems are reviewed and negative except as per the HPI above  Physical Exam: Vitals:   08/07/21 1002  BP: (!) 150/78  Pulse: 67  Weight: 100.8 kg  Height: $Remove'5\' 10"'HCpkwnM$  (1.778 m)   Wt Readings from Last 3 Encounters:  08/07/21 100.8 kg  02/11/21 98.9 kg  01/30/21 99.2 kg    Labs: Lab Results  Component Value Date   NA 137 01/30/2021   K 4.7 01/30/2021   CL 107 01/30/2021   CO2 23 01/30/2021   GLUCOSE 91 01/30/2021   BUN 31 (H) 01/30/2021   CREATININE 1.21 01/30/2021   CALCIUM 9.3 01/30/2021   MG 2.0 08/07/2021   Lab Results  Component Value Date   INR 1.11 11/19/2018   Lab Results  Component Value Date   CHOL 129 12/08/2018   HDL 45 12/08/2018   LDLCALC 66 12/08/2018   TRIG 89 12/08/2018     GEN- The patient is well appearing, alert and oriented x 3 today.   Head- normocephalic, atraumatic Eyes-  Sclera clear, conjunctiva pink Ears- hearing intact Oropharynx- clear Neck- supple, no JVP Lymph- no cervical lymphadenopathy Lungs- Clear to ausculation bilaterally, normal work of breathing Heart-regular rate and rhythm, no murmurs, rubs or gallops, PMI not laterally displaced GI- soft, NT, ND, + BS Extremities- no clubbing, cyanosis, or edema MS- no significant deformity or atrophy Skin- no rash or lesion Psych- euthymic mood, full affect Neuro- strength and sensation are intact  EKG- NSR at 67 bpm, pr int 184, qrs int 100 ms, qtc 373ms Epic records reviewed Echo-Study Conclusions   - Left ventricle: The cavity size was mildly dilated. Wall   thickness was increased in a pattern of mild LVH. Systolic   function was moderately to severely reduced. The estimated   ejection fraction was in the range of 30% to 35%. Diffuse   hypokinesis. - Aortic valve: There was trivial regurgitation. - Mitral valve: There was mild regurgitation.   Impressions: Echo- 11/2019- Study Conclusions   - Left ventricle: The cavity size was normal. Systolic function was   normal.  Wall motion was normal; there were no regional wall   motion abnormalities. Doppler parameters are consistent with   abnormal left ventricular relaxation (grade 1 diastolic   dysfunction). Doppler parameters are consistent with   indeterminate ventricular filling pressure. - Aortic valve: Transvalvular velocity was within the normal range.   There was no stenosis. There was mild regurgitation. - Aorta: Ascending aortic diameter: 37 mm (S). - Ascending aorta: The ascending aorta was mildly dilated. - Mitral valve: Transvalvular velocity was within the normal range.   There was no evidence for stenosis. There was no regurgitation. - Right ventricle: The cavity size was normal. Wall thickness was   normal. Systolic function was normal. - Atrial septum: No defect or patent foramen ovale was identified. - Tricuspid valve: Transvalvular velocity was within the normal   range. There was no regurgitation. - Pulmonary arteries: Systolic pressure could not be accurately   estimated. - Pericardium, extracardiac: A trivial pericardial effusion was   identified. - Global longitudinal strain -15.1% (mildly abnormal).   LHC- 8/21-Moderate three-vessel coronary artery disease. 60 to 70% mid RCA and 95% stenosis of the distal right coronary  beyond the origin of the PDA. Normal left main Mid LAD 60 to 70% stenosis beyond the origin of the dominant first diagonal. Large ramus intermedius with luminal irregularity. Relatively small distribution circumflex with 70% obstruction in the first marginal and total occlusion of the distal circumflex before the origin of the small second obtuse marginal.  There are faint left to left collaterals to the distal circumflex. Moderately severe left ventricular dysfunction with global hypokinesis, EF 35%.  Normal LVEDP.     RECOMMENDATIONS:   In absence of significant/limiting anginal symptoms, would recommend anti-ischemic therapy with beta-blockade and long-acting  nitrates.  If symptoms develop or become refractory PCI of the very distal RCA, and LAD would be possible.  Lesions were not angiographically critical, and therefore based on data from the Courage trial, medical therapy would seem to be adequate. Recommend guideline directed therapy for systolic heart failure: Transition to Entresto from ARB, transition Tenormin to carvedilol, add mineralocorticoid receptor antagonist (Aldactone or eplerenone).  With reference to heart failure, diabetes management should include an SGLT 2 agent to decrease incidence of heart failure symptoms. Finally it may be helpful to have restoration of sinus rhythm.  LV dysfunction is out of proportion to the degree of coronary disease.     Recommend Aspirin $RemoveBeforeDEI'81mg'[id number]$  daily for moderate CAD.  Assessment and Plan: 1. Persistent  Afib Staying in rhythm with dofetilide Continue drug at 500 mcg bid Qtc stable Mag today, K+ at PCP 4.2 on 8/16 Continue  OSA, on bipap  2.CHA2DS2VASc score of 3(htn,CAD,  DM)  Continue eliquis 5 mg bid   3. CAD / LV dysfunction  EF has normalized with restoring SR No anginal symptoms   4. Rt renal CA S/p surgery 03/2020  Resolved   F/u here in 6 months for tikosyn surveillance   F/u with Dr. Tamala Julian as scheduled   Geroge Baseman. Brigitte Soderberg, [facility] [address] [place], [place] [address] [phone]

## 2021-08-12 DIAGNOSIS — M533 Sacrococcygeal disorders, not elsewhere classified: Secondary | ICD-10-CM | POA: Diagnosis not present

## 2021-09-06 DIAGNOSIS — R519 Headache, unspecified: Secondary | ICD-10-CM | POA: Diagnosis not present

## 2021-09-06 DIAGNOSIS — M961 Postlaminectomy syndrome, not elsewhere classified: Secondary | ICD-10-CM | POA: Diagnosis not present

## 2021-09-06 DIAGNOSIS — Z6830 Body mass index (BMI) 30.0-30.9, adult: Secondary | ICD-10-CM | POA: Diagnosis not present

## 2021-09-06 DIAGNOSIS — M47812 Spondylosis without myelopathy or radiculopathy, cervical region: Secondary | ICD-10-CM | POA: Diagnosis not present

## 2021-09-06 DIAGNOSIS — M533 Sacrococcygeal disorders, not elsewhere classified: Secondary | ICD-10-CM | POA: Diagnosis not present

## 2021-09-06 DIAGNOSIS — I10 Essential (primary) hypertension: Secondary | ICD-10-CM | POA: Diagnosis not present

## 2021-09-08 ENCOUNTER — Other Ambulatory Visit: Payer: Self-pay | Admitting: Interventional Cardiology

## 2021-09-28 ENCOUNTER — Other Ambulatory Visit: Payer: Self-pay | Admitting: Interventional Cardiology

## 2021-10-02 ENCOUNTER — Other Ambulatory Visit: Payer: Self-pay | Admitting: Nurse Practitioner

## 2021-10-02 DIAGNOSIS — R0981 Nasal congestion: Secondary | ICD-10-CM | POA: Diagnosis not present

## 2021-10-02 DIAGNOSIS — J069 Acute upper respiratory infection, unspecified: Secondary | ICD-10-CM | POA: Diagnosis not present

## 2021-10-02 DIAGNOSIS — J209 Acute bronchitis, unspecified: Secondary | ICD-10-CM | POA: Diagnosis not present

## 2021-10-02 DIAGNOSIS — R051 Acute cough: Secondary | ICD-10-CM | POA: Diagnosis not present

## 2021-10-02 DIAGNOSIS — Z03818 Encounter for observation for suspected exposure to other biological agents ruled out: Secondary | ICD-10-CM | POA: Diagnosis not present

## 2021-10-17 ENCOUNTER — Other Ambulatory Visit: Payer: Self-pay

## 2021-10-17 ENCOUNTER — Other Ambulatory Visit (HOSPITAL_COMMUNITY): Payer: Self-pay | Admitting: Urology

## 2021-10-17 ENCOUNTER — Ambulatory Visit (HOSPITAL_COMMUNITY)
Admission: RE | Admit: 2021-10-17 | Discharge: 2021-10-17 | Disposition: A | Discharge status: Home / Self Care | Payer: No Typology Code available for payment source | Source: Ambulatory Visit | Attending: Urology | Admitting: Urology

## 2021-10-17 DIAGNOSIS — C641 Malignant neoplasm of right kidney, except renal pelvis: Secondary | ICD-10-CM | POA: Diagnosis not present

## 2021-10-19 DIAGNOSIS — R1319 Other dysphagia: Secondary | ICD-10-CM | POA: Diagnosis not present

## 2021-10-19 DIAGNOSIS — I517 Cardiomegaly: Secondary | ICD-10-CM | POA: Diagnosis not present

## 2021-10-19 DIAGNOSIS — I4891 Unspecified atrial fibrillation: Secondary | ICD-10-CM | POA: Diagnosis not present

## 2021-10-19 DIAGNOSIS — R001 Bradycardia, unspecified: Secondary | ICD-10-CM | POA: Diagnosis not present

## 2021-10-19 DIAGNOSIS — Z79899 Other long term (current) drug therapy: Secondary | ICD-10-CM | POA: Diagnosis not present

## 2021-10-19 DIAGNOSIS — K222 Esophageal obstruction: Secondary | ICD-10-CM | POA: Diagnosis not present

## 2021-10-19 DIAGNOSIS — Z7985 Long-term (current) use of injectable non-insulin antidiabetic drugs: Secondary | ICD-10-CM | POA: Diagnosis not present

## 2021-10-19 DIAGNOSIS — Z01818 Encounter for other preprocedural examination: Secondary | ICD-10-CM | POA: Diagnosis not present

## 2021-10-19 DIAGNOSIS — Z85528 Personal history of other malignant neoplasm of kidney: Secondary | ICD-10-CM | POA: Diagnosis not present

## 2021-10-19 DIAGNOSIS — Z7901 Long term (current) use of anticoagulants: Secondary | ICD-10-CM | POA: Diagnosis not present

## 2021-10-19 DIAGNOSIS — G4730 Sleep apnea, unspecified: Secondary | ICD-10-CM | POA: Diagnosis not present

## 2021-10-19 DIAGNOSIS — Z905 Acquired absence of kidney: Secondary | ICD-10-CM | POA: Diagnosis not present

## 2021-10-19 DIAGNOSIS — Z880 Allergy status to penicillin: Secondary | ICD-10-CM | POA: Diagnosis not present

## 2021-10-19 DIAGNOSIS — E785 Hyperlipidemia, unspecified: Secondary | ICD-10-CM | POA: Diagnosis not present

## 2021-10-19 DIAGNOSIS — Z7984 Long term (current) use of oral hypoglycemic drugs: Secondary | ICD-10-CM | POA: Diagnosis not present

## 2021-10-19 DIAGNOSIS — T18128A Food in esophagus causing other injury, initial encounter: Secondary | ICD-10-CM | POA: Diagnosis not present

## 2021-10-19 DIAGNOSIS — Z885 Allergy status to narcotic agent status: Secondary | ICD-10-CM | POA: Diagnosis not present

## 2021-10-19 DIAGNOSIS — I10 Essential (primary) hypertension: Secondary | ICD-10-CM | POA: Diagnosis not present

## 2021-10-19 DIAGNOSIS — E119 Type 2 diabetes mellitus without complications: Secondary | ICD-10-CM | POA: Diagnosis not present

## 2021-10-19 DIAGNOSIS — Z9884 Bariatric surgery status: Secondary | ICD-10-CM | POA: Diagnosis not present

## 2021-10-21 DIAGNOSIS — Z125 Encounter for screening for malignant neoplasm of prostate: Secondary | ICD-10-CM | POA: Diagnosis not present

## 2021-10-21 DIAGNOSIS — C641 Malignant neoplasm of right kidney, except renal pelvis: Secondary | ICD-10-CM | POA: Diagnosis not present

## 2021-10-30 ENCOUNTER — Other Ambulatory Visit (HOSPITAL_COMMUNITY): Payer: Self-pay | Admitting: Nurse Practitioner

## 2021-10-30 DIAGNOSIS — K432 Incisional hernia without obstruction or gangrene: Secondary | ICD-10-CM | POA: Diagnosis not present

## 2021-11-23 IMAGING — MR MR ABDOMEN WO/W CM
17 series · 48 of 48 positions shown · IV contrast (multihance)
Comparison: 02/20/2020 CT abdomen/pelvis.

CLINICAL DATA: Two suspicious right renal masses on recent CT.

EXAM:
MRI ABDOMEN WITHOUT AND WITH CONTRAST
TECHNIQUE: Multiplanar multisequence MR imaging of the abdomen was performed
both before and after the administration of intravenous contrast.
CONTRAST:  20mL MULTIHANCE GADOBENATE DIMEGLUMINE 529 MG/ML IV SOLN

[Series 3: T2 · coronal · 5.0mm · 1.72mm/px · 1 of 44 slices shown (1 of 3)]
[im 1/44]
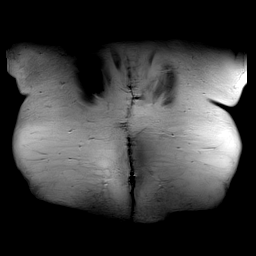

[Series 4: T1 · axial · 3.0mm · 1.38mm/px · z∈[-151,+110]mm · 5 of 176 slices shown]
[im 1/176]
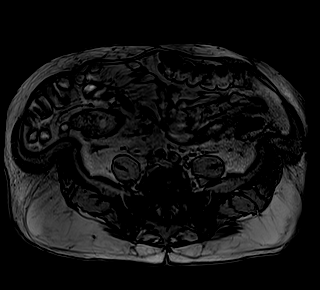
[im 44/176]
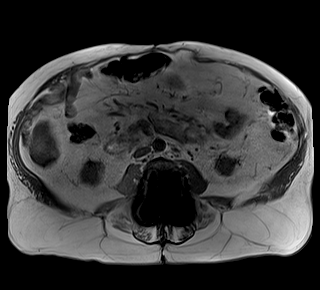
[im 88/176]
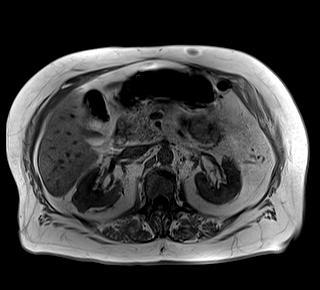
[im 132/176]
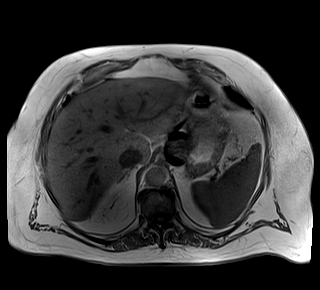
[im 176/176]
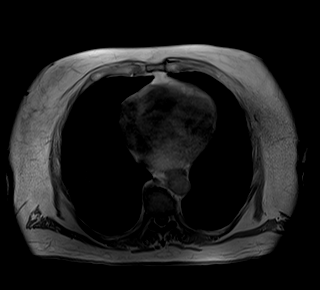

[Series 5: T2 · axial · 5.0mm · 1.72mm/px · z∈[-112,+146]mm · 2 of 44 slices shown (2 of 3)]
[im 1/44]
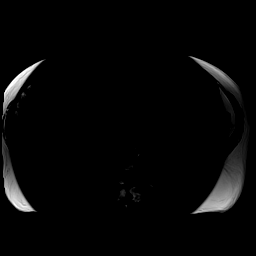
[im 44/44]
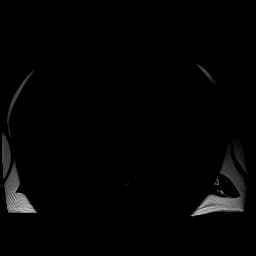

[Series 6: DWI · axial · 5.0mm · 1.42mm/px · z∈[-112,+146]mm · 5 of 132 slices shown (1 of 2)]
[im 1/132]
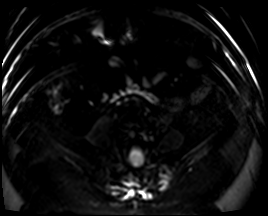
[im 33/132]
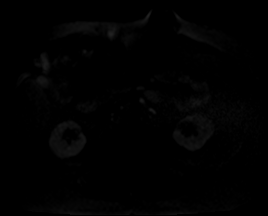
[im 66/132]
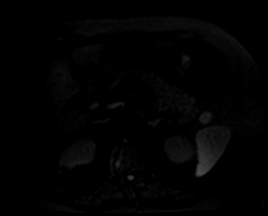
[im 99/132]
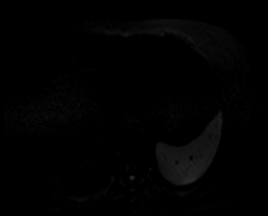
[im 132/132]
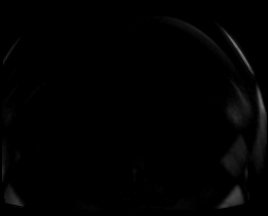

[Series 7: DWI · axial · 5.0mm · 1.42mm/px · z∈[-112,+146]mm · 2 of 44 slices shown (2 of 2)]
[im 1/44]
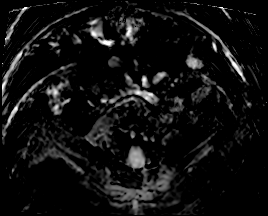
[im 44/44]
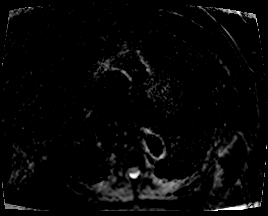

[Series 8: T2 · axial · 6.0mm · 1.38mm/px · 1 of 36 slices shown (3 of 3)]
[im 1/36]
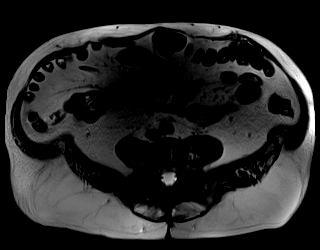

[Series 9: bSSFP · axial · 5.0mm · 1.25mm/px · z∈[-140,+118]mm · 2 of 44 slices shown]
[im 1/44]
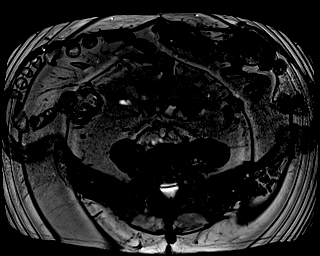
[im 44/44]
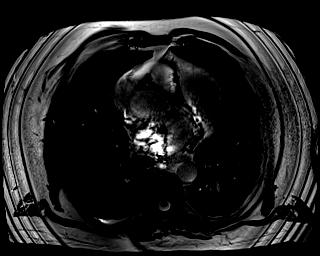

[Series 10: T1 dynamic · axial · non-contrast · 3.0mm · 1.38mm/px · z∈[-151,+110]mm · 3 of 88 slices shown]
[im 1/88]
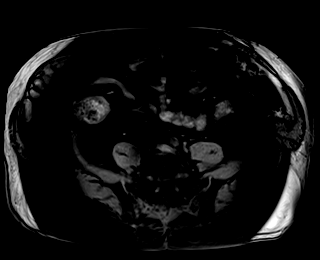
[im 44/88]
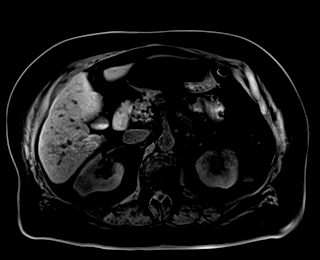
[im 88/88]
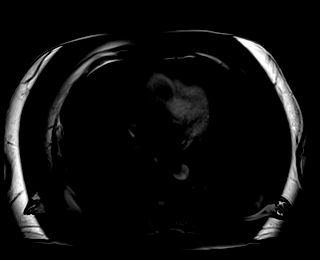

[Series 11: T1 dynamic post-contrast · axial · 3.0mm · 1.38mm/px · z∈[-151,+110]mm · 3 of 88 slices shown (1 of 9)]
[im 1/88]
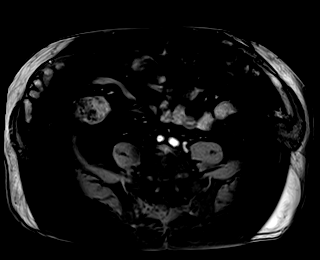
[im 44/88]
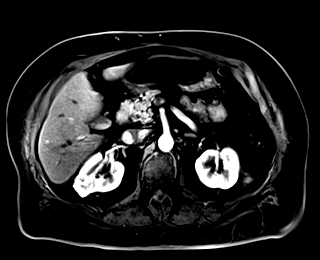
[im 88/88]
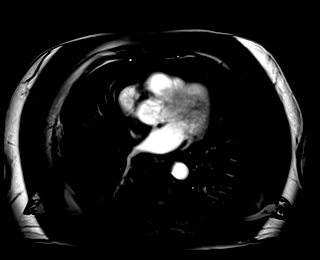

[Series 12: T1 dynamic post-contrast · axial · 3.0mm · 1.38mm/px · z∈[-151,+110]mm · 3 of 88 slices shown (2 of 9)]
[im 1/88]
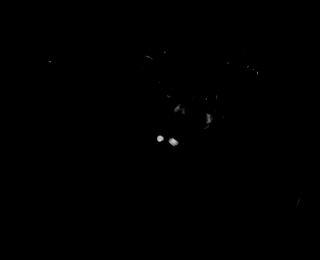
[im 44/88]
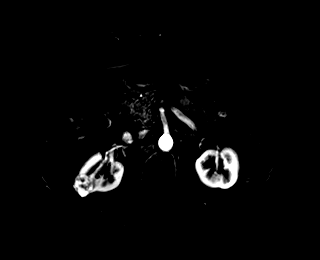
[im 88/88]
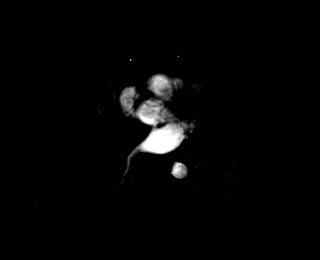

[Series 13: T1 dynamic post-contrast · axial · 3.0mm · 1.38mm/px · z∈[-151,+110]mm · 3 of 88 slices shown (3 of 9)]
[im 1/88]
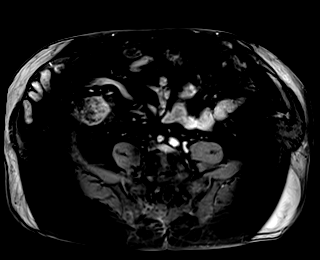
[im 44/88]
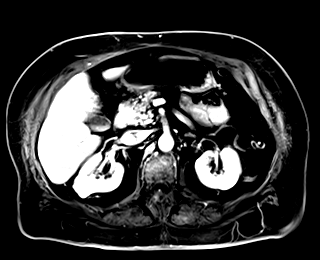
[im 88/88]
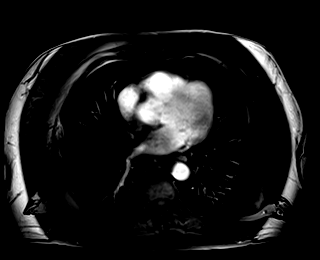

[Series 14: T1 dynamic post-contrast · axial · 3.0mm · 1.38mm/px · z∈[-151,+110]mm · 3 of 88 slices shown (4 of 9)]
[im 1/88]
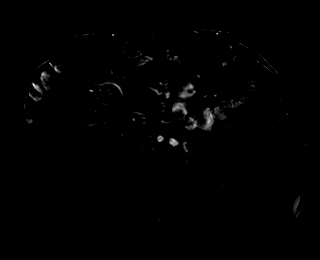
[im 44/88]
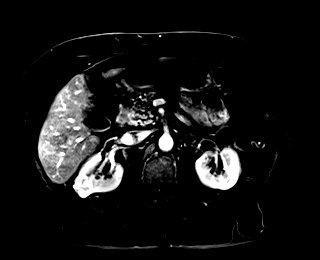
[im 88/88]
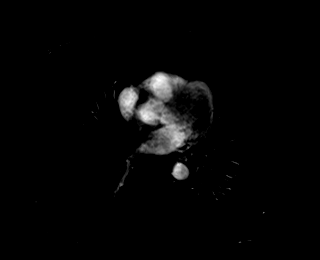

[Series 15: T1 dynamic post-contrast · axial · 3.0mm · 1.38mm/px · z∈[-151,+110]mm · 3 of 88 slices shown (5 of 9)]
[im 1/88]
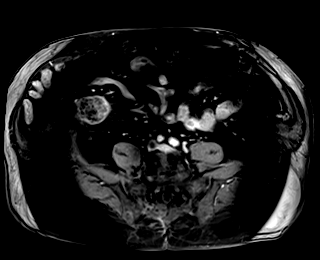
[im 44/88]
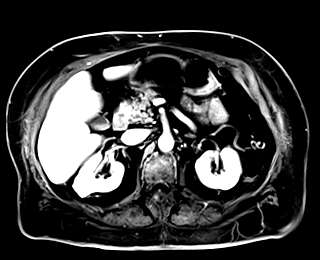
[im 88/88]
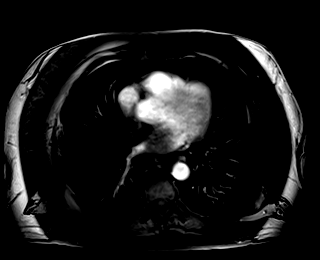

[Series 16: T1 dynamic post-contrast · axial · 3.0mm · 1.38mm/px · z∈[-151,+110]mm · 3 of 88 slices shown (6 of 9)]
[im 1/88]
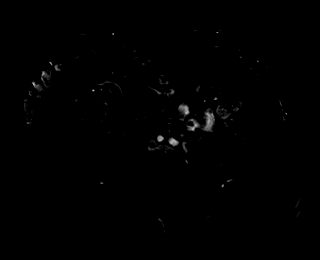
[im 44/88]
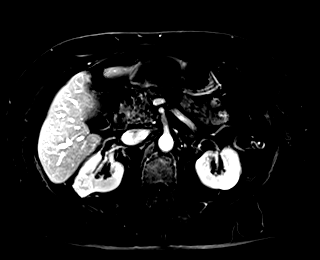
[im 88/88]
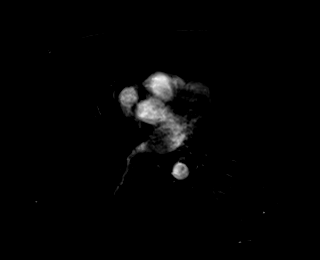

[Series 17: T1 dynamic post-contrast · coronal · 3.0mm · 1.38mm/px · 3 of 88 slices shown (7 of 9)]
[im 1/88]
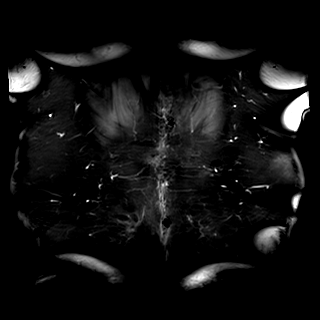
[im 44/88]
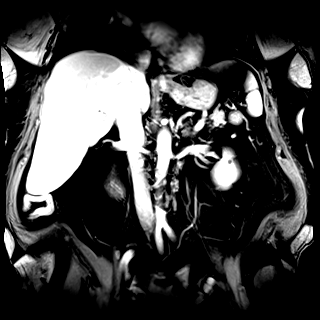
[im 88/88]
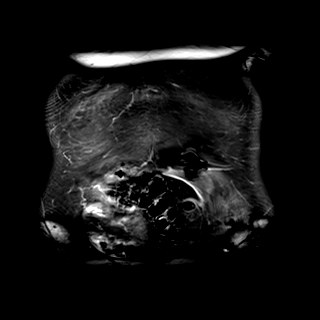

[Series 18: T1 dynamic post-contrast · axial · 3.0mm · 1.38mm/px · z∈[-151,+110]mm · 3 of 88 slices shown (8 of 9)]
[im 1/88]
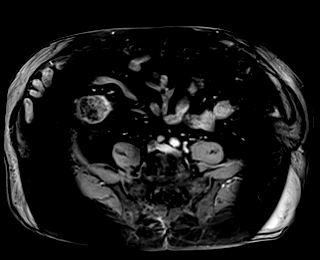
[im 44/88]
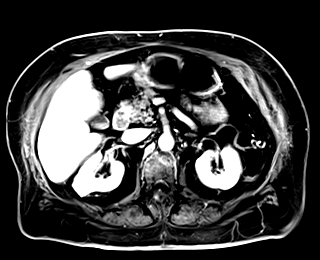
[im 88/88]
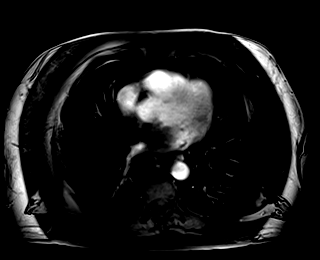

[Series 19: T1 dynamic post-contrast · axial · 3.0mm · 1.38mm/px · z∈[-151,+110]mm · 3 of 88 slices shown (9 of 9)]
[im 1/88]
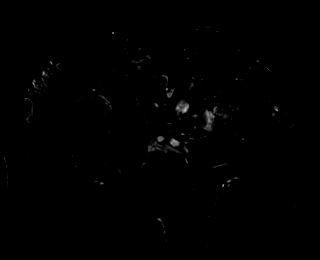
[im 44/88]
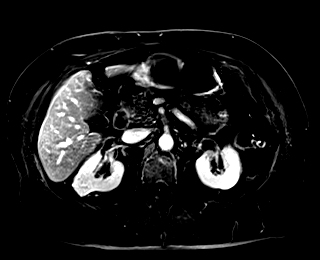
[im 88/88]
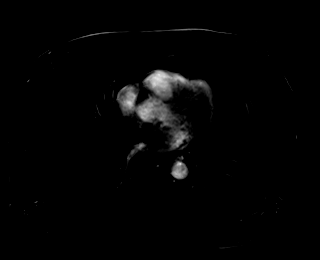

[48 of 48 positions shown; findings below may reference images not displayed]

FINDINGS: Lower chest: No acute abnormality at the lung bases.

Hepatobiliary: Normal liver size and configuration. No hepatic
steatosis. Several scattered subcentimeter foci of arterial phase
hyperenhancement throughout the liver (for example series 11/image 6
at the dome and image 35 anteriorly) are occult on all other
sequences, most compatible with benign transient vascular phenomena.
Otherwise no liver masses. Small amount of layering sludge in the
gallbladder. No cholelithiasis. No biliary ductal dilatation. Common
bile duct diameter 3 mm. No choledocholithiasis. No biliary masses,
strictures or beading.

Pancreas: No pancreatic mass or duct dilation. No definite pancreas
divisum (this study not tailored for evaluation of pancreatic ductal
anatomy).

Spleen: Normal size. No mass.

Adrenals/Urinary Tract: Normal adrenals. No hydronephrosis. There is
a 3.2 x 2.7 x 3.6 cm renal cortical mass in posterolateral mid to
upper right kidney (series 8/image 20) with avid enhancement,
compatible with clear cell renal cell carcinoma. There is a 1.2 x
1.0 cm renal cortical lesion in the posterior interpolar right
kidney (series 18/image 51) with heterogeneous precontrast T1
hyperintensity and questionable low level enhancement. Several
simple subcentimeter renal cortical cysts scattered in both kidneys.

Stomach/Bowel: Gastric band in place in the gastric cardia.
Otherwise normal nondistended stomach. Visualized small and large
bowel is normal caliber, with no bowel wall thickening.

Vascular/Lymphatic: Atherosclerotic nonaneurysmal abdominal aorta.
Patent portal, splenic, hepatic and renal veins. No evidence of
renal vein tumor thrombus. No pathologically enlarged lymph nodes in
the abdomen.

Other: No abdominal ascites or focal fluid collection.

Musculoskeletal: No aggressive appearing focal osseous lesions.
Posterior spinal fusion hardware in the lumbar spine.
IMPRESSION: 1. Avidly enhancing 3.2 x 2.7 x 3.6 cm renal cortical mass in the
posterolateral mid to upper right kidney, compatible with clear cell
renal cell carcinoma.
2. Indeterminate 1.2 cm renal cortical mass in the posterior
interpolar right kidney with questionable low level enhancement,
cannot exclude papillary renal cell carcinoma. Follow-up MRI abdomen
without and with IV contrast is recommended in 6 months if not
resected.
3. No evidence of renal vein tumor thrombus.
4. No findings highly suspicious for metastatic disease in the
abdomen. Scattered subcentimeter foci of arterial hyperenhancement
in the liver, favor benign transient vascular phenomena. Suggest
attention on follow-up MRI abdomen without and with IV contrast in
3-6 months.
5.  Aortic Atherosclerosis ([id number]).

## 2021-12-27 DIAGNOSIS — R1310 Dysphagia, unspecified: Secondary | ICD-10-CM | POA: Diagnosis not present

## 2021-12-27 DIAGNOSIS — E6609 Other obesity due to excess calories: Secondary | ICD-10-CM | POA: Diagnosis not present

## 2021-12-27 DIAGNOSIS — Z6831 Body mass index (BMI) 31.0-31.9, adult: Secondary | ICD-10-CM | POA: Diagnosis not present

## 2021-12-27 DIAGNOSIS — Z9884 Bariatric surgery status: Secondary | ICD-10-CM | POA: Diagnosis not present

## 2022-01-01 DIAGNOSIS — R051 Acute cough: Secondary | ICD-10-CM | POA: Diagnosis not present

## 2022-01-01 DIAGNOSIS — J0140 Acute pansinusitis, unspecified: Secondary | ICD-10-CM | POA: Diagnosis not present

## 2022-01-20 DIAGNOSIS — E1159 Type 2 diabetes mellitus with other circulatory complications: Secondary | ICD-10-CM | POA: Diagnosis not present

## 2022-01-20 DIAGNOSIS — K279 Peptic ulcer, site unspecified, unspecified as acute or chronic, without hemorrhage or perforation: Secondary | ICD-10-CM | POA: Diagnosis not present

## 2022-01-20 DIAGNOSIS — I10 Essential (primary) hypertension: Secondary | ICD-10-CM | POA: Diagnosis not present

## 2022-01-20 DIAGNOSIS — I4891 Unspecified atrial fibrillation: Secondary | ICD-10-CM | POA: Diagnosis not present

## 2022-01-20 DIAGNOSIS — D6869 Other thrombophilia: Secondary | ICD-10-CM | POA: Diagnosis not present

## 2022-01-20 DIAGNOSIS — I700 Atherosclerosis of aorta: Secondary | ICD-10-CM | POA: Diagnosis not present

## 2022-01-20 DIAGNOSIS — I5022 Chronic systolic (congestive) heart failure: Secondary | ICD-10-CM | POA: Diagnosis not present

## 2022-01-20 DIAGNOSIS — J019 Acute sinusitis, unspecified: Secondary | ICD-10-CM | POA: Diagnosis not present

## 2022-01-20 DIAGNOSIS — E7800 Pure hypercholesterolemia, unspecified: Secondary | ICD-10-CM | POA: Diagnosis not present

## 2022-01-28 NOTE — Progress Notes (Addendum)
Cardiology Office Note:    Date:  01/29/2022   ID:  Casey Garbe., DOB 11-Aug-1956, MRN [id number]  PCP:  Orpah Melter, MD  Cardiologist:  Casey Grooms, MD   Referring MD: Orpah Melter, MD   Chief Complaint  Patient presents with   Hospitalization Follow-up    Chronic upper respiratory complaints/cough Atrial fibrillation Nonobstructive CAD Systolic heart failure    History of Present Illness:     Casey Wagar. is a 66 y.o. male with a hx of  atrial fibrillation, systolic left ventricular dysfunction with EF less than 35%--> 65% after conversion and addition of Tikosyn and GDMT for HF, and further w/u showed no obstructive CAD.   He is not having orthopnea, PND, or exertional dyspnea.  He denies chest pain associated with physical activity.  Over the last several months he has had multiple rounds of antibiotics to clear up what is felt to be an upper respiratory infection.  He has coughing productive of green phlegm from time to time.  No chills or fever.  Last saw Dr. Doyle Askew and antibiotics were again given.  Planning to have an extensive abdominal wall surgery for ventral hernia by Dr. Alvan Dame at [facility].  He has for clearance.  Past Medical History:  Diagnosis Date   Arthritis    Atrial fibrillation ([place]) 07/2018   Cancer ([place])    skin Ca on nose,kidney   Chronic kidney disease    Complication of anesthesia    diff. waking up   Diabetes mellitus    Dysrhythmia 07/2018   A-Fib   Headache(784.0)    Hypertension    dr Marisue Humble   pcp   Sleep apnea    prior to weight lose surgery-Dr. Don Perking not use CPAP    Past Surgical History:  Procedure Laterality Date   APPENDECTOMY     BACK SURGERY     5  back surgeries   CARDIAC CATHETERIZATION     CARDIOVERSION N/A 08/13/2018   Procedure: CARDIOVERSION;  Surgeon: Larey Dresser, MD;  Location: [facility] ENDOSCOPY;  Service: Cardiovascular;  Laterality: N/A;   CARDIOVERSION N/A  09/02/2018   Procedure: CARDIOVERSION;  Surgeon: Thayer Headings, MD;  Location: [facility] ENDOSCOPY;  Service: Cardiovascular;  Laterality: N/A;   CARPAL TUNNEL RELEASE     bil   COLON SURGERY     2004 for colon rupture   HERNIA REPAIR     LAPAROSCOPIC GASTRIC BANDING     LEFT HEART CATH AND CORONARY ANGIOGRAPHY N/A 07/21/2018   Procedure: LEFT HEART CATH AND CORONARY ANGIOGRAPHY;  Surgeon: Belva Crome, MD;  Location: [place] CV LAB;  Service: Cardiovascular;  Laterality: N/A;   MANDIBLE FRACTURE SURGERY     x 2   PAROTIDECTOMY Right 11/19/2018   Procedure: PAROTIDECTOMY;  Surgeon: Melida Quitter, MD;  Location: [place];  Service: ENT;  Laterality: Right;   ROBOTIC ASSITED PARTIAL NEPHRECTOMY Right 04/04/2020   Procedure: XI ROBOTIC ASSITED PARTIAL [place];  Surgeon: Alexis Frock, MD;  Location: [facility] ORS;  Service: Urology;  Laterality: Right;  3.5 HRS   TONSILLECTOMY      Current Medications: Current Meds  Medication Sig   atorvastatin (LIPITOR) 10 MG tablet Take 10 mg by mouth at bedtime.    B-D UF III MINI PEN NEEDLES 31G X 5 MM MISC USE WITH VICTOZA AS DIRECTED ONCE A DAY   carvedilol (COREG) 25 MG tablet TAKE 0.5 TABLETS (12.5 MG TOTAL) BY MOUTH IN THE  MORNING AND AT BEDTIME.   dofetilide (TIKOSYN) 500 MCG capsule TAKE 1 CAPSULE BY MOUTH 2 TIMES DAILY.   ELIQUIS 5 MG TABS tablet TAKE 1 TABLET BY MOUTH TWICE A DAY   ENTRESTO 49-51 MG TAKE 1 TABLET BY MOUTH TWICE A DAY   glimepiride (AMARYL) 2 MG tablet Take 2 mg by mouth daily with breakfast.   glucose blood test strip USE TO TEST BLOOD SUGAR THREE TIMES A DAY   HYDROcodone-acetaminophen (NORCO) 10-325 MG tablet Take 1 tablet by mouth every 4 (four) hours as needed (for severe back pain.).   liraglutide (VICTOZA) 18 MG/3ML SOPN Inject 1.8 mg into the skin every evening.    magnesium gluconate (MAGONATE) 500 MG tablet Take 500 mg by mouth 2 (two) times daily.   metFORMIN (GLUCOPHAGE-XR) 500 MG 24 hr tablet  Take 1,000 mg by mouth 2 (two) times daily.   Multiple Vitamin (ONE DAILY MULTIVITAMIN ADULT PO) Take 1 tablet by mouth daily.   Multiple Vitamins-Minerals (PRESERVISION AREDS PO) Take 1 capsule by mouth 2 (two) times daily.   pantoprazole (PROTONIX) 40 MG tablet Take 40 mg by mouth every morning.   spironolactone (ALDACTONE) 25 MG tablet TAKE 1/2 TABLET BY MOUTH EVERY DAY   tamsulosin (FLOMAX) 0.4 MG CAPS capsule Take 0.4 mg by mouth daily.   topiramate (TOPAMAX) 25 MG tablet Take 25 mg by mouth 2 (two) times daily.      Allergies:   Penicillins, Percocet [oxycodone-acetaminophen], Ace inhibitors, Align prebiotic-probiotic, Gabapentin, Lansoprazole, Lopressor [metoprolol], Rofecoxib, Simvastatin, Sulfamethoxazole-trimethoprim, Zoloft [sertraline], and Codeine   Social History   Socioeconomic History   Marital status: Married    Spouse name: Not on file   Number of children: Not on file   Years of education: Not on file   Highest education level: Not on file  Occupational History   Not on file  Tobacco Use   Smoking status: Former    Packs/day: 0.90    Years: 15.00    Pack years: 13.50    Types: Cigarettes    Quit date: 01/19/2006    Years since quitting: 16.0   Smokeless tobacco: Never  Vaping Use   Vaping Use: Never used  Substance and Sexual Activity   Alcohol use: No   Drug use: No   Sexual activity: Not on file  Other Topics Concern   Not on file  Social History Narrative   Not on file   Social Determinants of Health   Financial Resource Strain: Not on file  Food Insecurity: Not on file  Transportation Needs: Not on file  Physical Activity: Not on file  Stress: Not on file  Social Connections: Not on file     Family History: The patient's family history includes Diabetes in his maternal grandfather. There is no history of Heart disease.  ROS:   Please see the history of present illness.    Other than the cough as noted above no other real complaints.  All  other systems reviewed and are negative.  EKGs/Labs/Other Studies Reviewed:    The following studies were reviewed today: 03/2021 CT Angiogram of Aorta  IMPRESSION: Coronary artery calcification. Aortic atherosclerotic calcification.   Maximal diameter ascending aorta 4.2 cm. Recommend annual imaging followup by CTA or MRA. This recommendation follows 2010 ACCF/AHA/AATS/ACR/ASA/SCA/SCAI/SIR/STS/SVM Guidelines for the Diagnosis and Management of Patients with Thoracic Aortic Disease. Circulation. 2010; 121: C588-F027. Aortic aneurysm NOS (ICD10-I71.9)   Aortic Atherosclerosis (ICD10-I70.0).   2022 ECHOCARDIOGRAM IMPRESSIONS     1. Left ventricular ejection  fraction, by estimation, is 60 to 65%. The  left ventricle has normal function. The left ventricle has no regional  wall motion abnormalities. Left ventricular diastolic parameters are  consistent with Grade I diastolic  dysfunction (impaired relaxation). Elevated left ventricular end-diastolic  pressure.   2. Right ventricular systolic function is normal. The right ventricular  size is normal.   3. The mitral valve is normal in structure. Mild mitral valve  regurgitation. No evidence of mitral stenosis.   4. The aortic valve is calcified. There is mild calcification of the  aortic valve. There is mild thickening of the aortic valve. Aortic valve  regurgitation is moderate. No aortic stenosis is present.   5. Aortic dilatation noted. There is moderate dilatation of the ascending  aorta, measuring 46 mm.   6. The inferior vena cava is normal in size with greater than 50%  respiratory variability, suggesting right atrial pressure of 3 mmHg.  EKG:  EKG not performed  Recent Labs: 01/30/2021: BUN 31; Creatinine, Ser 1.21; Potassium 4.7; Sodium 137 08/07/2021: Hemoglobin 13.5; Magnesium 2.0; Platelets 212  Recent Lipid Panel    Component Value Date/Time   CHOL 129 12/08/2018 1113   TRIG 89 12/08/2018 1113   HDL 45 12/08/2018  1113   CHOLHDL 2.9 12/08/2018 1113   LDLCALC 66 12/08/2018 1113    Physical Exam:    VS:  BP 136/78    Pulse 68    Ht 5\' 10"  (1.778 m)    Wt 220 lb 12.8 oz (100.2 kg)    SpO2 96%    BMI 31.68 kg/m     Wt Readings from Last 3 Encounters:  01/29/22 220 lb 12.8 oz (100.2 kg)  08/07/21 222 lb 3.2 oz (100.8 kg)  02/11/21 218 lb (98.9 kg)     GEN: Obese. No acute distress HEENT: Normal NECK: No JVD. LYMPHATICS: No lymphadenopathy CARDIAC: No murmur. RRR no gallop, or edema. VASCULAR:  Normal Pulses. No bruits. RESPIRATORY:  Clear to auscultation without rales, wheezing or rhonchi  ABDOMEN: Soft, non-tender, non-distended, No pulsatile mass, MUSCULOSKELETAL: No deformity  SKIN: Warm and dry NEUROLOGIC:  Alert and oriented x 3 PSYCHIATRIC:  Normal affect   ASSESSMENT:    1. Coronary artery disease involving native coronary artery of native heart without angina pectoris   2. Preoperative cardiovascular examination   3. Persistent atrial fibrillation ([place])   4. Secondary hypercoagulable state ([place])   5. Aortic dilatation (HCC)   6. Chronic systolic heart failure ([place])   7. Obstructive sleep apnea (adult) (pediatric)   8. Hyperlipidemia with target LDL less than 70   9. Uncontrolled type 2 diabetes mellitus with hyperglycemia, without long-term current use of insulin (HCC)    PLAN:    In order of problems listed above:  Secondary prevention discussed. He is cleared for upcoming abdominal wall hernia surgery by Dr. Alvan Dame at [facility]. Continue dofetilide therapy.  Continue to follow with electrophysiology. Continue Eliquis therapy. We will perform an aortic CT prior to the next visit in 1 year Continue 3 drug therapy for systolic heart failure improved.  He is having upper respiratory complaints and possibly the cough could be related to [facility] therapy within [facility]  I would not be adverse to a trial off Entresto if that would be helpful in deciphering the  complaint. CPAP should be continued Liver and lipid panel today.  Continue low-dose statin therapy. Consider SGLT2 therapy especially if we decide to withdraw Entresto.  Overall education and awareness concerning secondary  risk prevention was discussed in detail: LDL less than 70, hemoglobin A1c less than 7, blood pressure target less than 130/80 mmHg, >150 minutes of moderate aerobic activity per week, avoidance of smoking, weight control (via diet and exercise), and continued surveillance/management of/for obstructive sleep apnea.    Medication Adjustments/Labs and Tests Ordered: Current medicines are reviewed at length with the patient today.  Concerns regarding medicines are outlined above.  Orders Placed This Encounter  Procedures   CT ANGIO CHEST AORTA W/CM & OR WO/CM   Lipid panel   Hepatic function panel   No orders of the defined types were placed in this encounter.   Patient Instructions  Medication Instructions:  Your physician recommends that you continue on your current medications as directed. Please refer to the Current Medication list given to you today.  *If you need a refill on your cardiac medications before your next appointment, please call your pharmacy*   Lab Work: Lipid and Liver today  If you have labs (blood work) drawn today and your tests are completely normal, you will receive your results only by: [place] (if you have MyChart) OR A paper copy in the mail If you have any lab test that is abnormal or we need to change your treatment, we will call you to review the results.   Testing/Procedures: Your physician recommends that you have a Chest CT 1-2 weeks prior to seeing him back in one year.   Follow-Up: At [facility], you and your health needs are our priority.  As part of our continuing mission to provide you with exceptional heart care, we have created designated Provider Care Teams.  These Care Teams include your primary  Cardiologist (physician) and Advanced Practice Providers (APPs -  Physician Assistants and Nurse Practitioners) who all work together to provide you with the care you need, when you need it.  We recommend signing up for the patient portal called "MyChart".  Sign up information is provided on this After Visit Summary.  MyChart is used to connect with patients for Virtual Visits (Telemedicine).  Patients are able to view lab/test results, encounter notes, upcoming appointments, etc.  Non-urgent messages can be sent to your provider as well.   To learn more about what you can do with MyChart, go to [email or web address].    Your next appointment:   1 year(s)  The format for your next appointment:   In Person  Provider:   Sinclair Grooms, MD     Other Instructions     Signed, Casey Grooms, MD  01/29/2022 10:07 AM    [place]

## 2022-01-29 ENCOUNTER — Other Ambulatory Visit: Payer: Self-pay

## 2022-01-29 ENCOUNTER — Encounter: Payer: Self-pay | Admitting: Interventional Cardiology

## 2022-01-29 ENCOUNTER — Ambulatory Visit (INDEPENDENT_AMBULATORY_CARE_PROVIDER_SITE_OTHER): Payer: No Typology Code available for payment source | Admitting: Interventional Cardiology

## 2022-01-29 VITALS — BP 136/78 | HR 68 | Ht 70.0 in | Wt 220.8 lb

## 2022-01-29 DIAGNOSIS — D6869 Other thrombophilia: Secondary | ICD-10-CM | POA: Diagnosis not present

## 2022-01-29 DIAGNOSIS — E1165 Type 2 diabetes mellitus with hyperglycemia: Secondary | ICD-10-CM

## 2022-01-29 DIAGNOSIS — I4819 Other persistent atrial fibrillation: Secondary | ICD-10-CM

## 2022-01-29 DIAGNOSIS — I2510 Atherosclerotic heart disease of native coronary artery without angina pectoris: Secondary | ICD-10-CM

## 2022-01-29 DIAGNOSIS — E785 Hyperlipidemia, unspecified: Secondary | ICD-10-CM | POA: Diagnosis not present

## 2022-01-29 DIAGNOSIS — G4733 Obstructive sleep apnea (adult) (pediatric): Secondary | ICD-10-CM

## 2022-01-29 DIAGNOSIS — I77819 Aortic ectasia, unspecified site: Secondary | ICD-10-CM

## 2022-01-29 DIAGNOSIS — Z01810 Encounter for preprocedural cardiovascular examination: Secondary | ICD-10-CM

## 2022-01-29 DIAGNOSIS — I5022 Chronic systolic (congestive) heart failure: Secondary | ICD-10-CM

## 2022-01-29 LAB — LIPID PANEL
Chol/HDL Ratio: 2.9 ratio (ref 0.0–5.0)
Cholesterol, Total: 141 mg/dL (ref 100–199)
HDL: 49 mg/dL (ref >39)
LDL Chol Calc (NIH): 78 mg/dL (ref 0–99)
Triglycerides: 71 mg/dL (ref 0–149)
VLDL Cholesterol Cal: 14 mg/dL (ref 5–40)

## 2022-01-29 LAB — HEPATIC FUNCTION PANEL
ALT: 14 IU/L (ref 0–44)
AST: 19 IU/L (ref 0–40)
Albumin: 4.1 g/dL (ref 3.8–4.8)
Alkaline Phosphatase: 79 IU/L (ref 44–121)
Bilirubin Total: 0.3 mg/dL (ref 0.0–1.2)
Bilirubin, Direct: 0.12 mg/dL (ref 0.00–0.40)
Total Protein: 7.1 g/dL (ref 6.0–8.5)

## 2022-01-29 NOTE — Patient Instructions (Addendum)
Medication Instructions:  ?Your physician recommends that you continue on your current medications as directed. Please refer to the Current Medication list given to you today. ? ?*If you need a refill on your cardiac medications before your next appointment, please call your pharmacy* ? ? ?Lab Work: ?Lipid and Liver today ? ?If you have labs (blood work) drawn today and your tests are completely normal, you will receive your results only by: ?MyChart Message (if you have MyChart) OR ?A paper copy in the mail ?If you have any lab test that is abnormal or we need to change your treatment, we will call you to review the results. ? ? ?Testing/Procedures: ?Your physician recommends that you have a Chest CT 1-2 weeks prior to seeing him back in one year. ? ? ?Follow-Up: ?At [facility], you and your health needs are our priority.  As part of our continuing mission to provide you with exceptional heart care, we have created designated Provider Care Teams.  These Care Teams include your primary Cardiologist (physician) and Advanced Practice Providers (APPs -  Physician Assistants and Nurse Practitioners) who all work together to provide you with the care you need, when you need it. ? ?We recommend signing up for the patient portal called "MyChart".  Sign up information is provided on this After Visit Summary.  MyChart is used to connect with patients for Virtual Visits (Telemedicine).  Patients are able to view lab/test results, encounter notes, upcoming appointments, etc.  Non-urgent messages can be sent to your provider as well.   ?To learn more about what you can do with MyChart, go to [email or web address].   ? ?Your next appointment:   ?1 year(s) ? ?The format for your next appointment:   ?In Person ? ?Provider:   ?Sinclair Grooms, MD   ? ? ?Other Instructions ?  ?

## 2022-02-05 ENCOUNTER — Other Ambulatory Visit: Payer: Self-pay

## 2022-02-05 ENCOUNTER — Ambulatory Visit (HOSPITAL_COMMUNITY)
Admission: RE | Admit: 2022-02-05 | Discharge: 2022-02-05 | Disposition: A | Discharge status: Home / Self Care | Payer: No Typology Code available for payment source | Source: Ambulatory Visit | Attending: Nurse Practitioner | Admitting: Nurse Practitioner

## 2022-02-05 VITALS — BP 140/74 | HR 53 | Ht 70.0 in | Wt 219.4 lb

## 2022-02-05 DIAGNOSIS — E119 Type 2 diabetes mellitus without complications: Secondary | ICD-10-CM | POA: Diagnosis not present

## 2022-02-05 DIAGNOSIS — Z7901 Long term (current) use of anticoagulants: Secondary | ICD-10-CM | POA: Diagnosis not present

## 2022-02-05 DIAGNOSIS — I2510 Atherosclerotic heart disease of native coronary artery without angina pectoris: Secondary | ICD-10-CM | POA: Diagnosis not present

## 2022-02-05 DIAGNOSIS — I4819 Other persistent atrial fibrillation: Secondary | ICD-10-CM | POA: Insufficient documentation

## 2022-02-05 DIAGNOSIS — D6869 Other thrombophilia: Secondary | ICD-10-CM

## 2022-02-05 DIAGNOSIS — G4733 Obstructive sleep apnea (adult) (pediatric): Secondary | ICD-10-CM | POA: Insufficient documentation

## 2022-02-05 DIAGNOSIS — N2889 Other specified disorders of kidney and ureter: Secondary | ICD-10-CM | POA: Diagnosis not present

## 2022-02-05 DIAGNOSIS — I10 Essential (primary) hypertension: Secondary | ICD-10-CM | POA: Insufficient documentation

## 2022-02-05 LAB — BASIC METABOLIC PANEL
Anion gap: 6 (ref 5–15)
BUN: 17 mg/dL (ref 8–23)
CO2: 25 mmol/L (ref 22–32)
Calcium: 9 mg/dL (ref 8.9–10.3)
Chloride: 108 mmol/L (ref 98–111)
Creatinine, Ser: 1.09 mg/dL (ref 0.61–1.24)
GFR, Estimated: >60 mL/min (ref >60)
Glucose, Bld: 93 mg/dL (ref 70–99)
Potassium: 4.3 mmol/L (ref 3.5–5.1)
Sodium: 139 mmol/L (ref 135–145)

## 2022-02-05 LAB — MAGNESIUM: Magnesium: 1.9 mg/dL (ref 1.7–2.4)

## 2022-02-05 NOTE — Progress Notes (Signed)
Primary Care Physician: Orpah Melter, MD Referring Physician: Dr. Fransico Him   Casey [name]. is a 66 y.o. male with a h/o obesity, s/p gastric sleeve, HTN, DM that presented for pre op for rt parotidectomy 8/8 and was found to be in rate controlled afib, from which he was asymptomatic. He was sent to the afib clinic for evaluation. 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 He has felt some fatigue, less stamina for several months. Was just seen by PCP yesterday and nothing out of the ordinary was noted with his heart rhythm. He denies any exertional chest pain.   He does not drink alcohol, no tobacco use,no excessive caffeine. He has kept his weight stable for several years. Was almost 100 lbs heavier before gastric sleeve.  I discussed with Dr. Rayann Heman and he recommended an echo and if ok, he would be considered at low risk for surgery and could start anticoagulation after surgery. However, pt is now back in the office as his echo did show a reduced EF at 30-35%. Dr. Rayann Heman discussed with Dr. Redmond Baseman and it is felt most appropriate  to delay surgery in order to obtain  LHC to further determine his risk for surgery. He does have cardiac risk factors for CAD , ie , obesity,  HTN, DM. He denies any exertional chest pain but c/o fatigue/ low stamina  for several months/early summer. LHC did show 3 vessel  moderate  CAD  F/u in afib clinic, he is now been on anticoagulation x 2 weeks without interruption and can now schedule cardioversion after another week, he is in agreement.  F/u in afib clinic,9/20, he unfortunately had unsuccessful cardioversion and is back in the clinic to discuss antiarrythmic's.It was decided that he would come into the hospital for Tikosyn after he checked on the price of the drug.  F/u in afib clinic, 10/1, for admission for Tikosyn.  He will get the drug free for the rest  of the year as he had met his out of pocket deductible.   F/u in afib clinic 10/11. He is staying in [place]. He feels improved. His reduced EF meds have been titrated by Dr. Tamala Julian.  F/u in afib clinic,01/07/19. He is in SR and has been doing well staying in rhythm on dofetilide. He ultimately had his parotid gland successfully removed and did not have any issues with the surgery, he was benign. He had a sleep study and it was positive for OSA. He is suppose to pick up his equipment soon.He has had an echo since restoring SR and has shown normalization of EF.  F/u in afib clinic 10/22, he is staying in [place] with tikosyn. He has had neck surgeries and seeing some improvement in his pain.   F/u in afib clinic, 02/01/20. He continues to stay in [place] with Tikosyn. Continues on eliquis with a CHA2DS2VASc of 2. Had a back injection last week and came off eliquis x 3 days. He is anticipating  a surgery for a rod to be placed in his cervical spine in the near future. Has had both covid shots.  Here in afib clinic 08/02/20,  for dofetilide screening. He reports that he  is doing well without any afib burden. EKG shows SR with stable qt. He also reports have renal CA and had Rt partial nephrectomy in May. It was felt the surgery  was successful and he did not have to have any f/u radiation or  Chemo. He remained in SR during the surgery. He has lost around 39 lbs form the surgery, mostly as he did not have much appetite for several weeks following surgery.   F/u in afib clinic, 01/30/21. He has not noted any afib. Remains on tikosyn. He feels well. No changes in health. 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 No issues  with eliquis for a CHA2DS2VASc score of 2.   F/u afib clinic, 08/07/21. He has not noted any afib. Compliant with Tikosyn and anticoagulation. Passed a kidney stone a while back.   F/u in afib clinic, 02/05/22. He is doing well on  tikosyn. No afib noted. Qtc stable. States will need a hernia repair in May.   Today, he denies symptoms of palpitations, chest pain, shortness of breath, orthopnea, PND, lower extremity edema, dizziness, presyncope, syncope, or neurologic sequela.  The patient is tolerating medications without difficulties and is otherwise without complaint today.   Past Medical History:  Diagnosis Date   Arthritis    Atrial fibrillation ([place]) 07/2018   Cancer ([place])    skin Ca on nose,kidney   Chronic kidney disease    Complication of anesthesia    diff. waking up   Diabetes mellitus    Dysrhythmia 07/2018   A-Fib   Headache(784.0)    Hypertension    dr Marisue Humble   pcp   Sleep apnea    prior to weight lose surgery-Dr. Don Perking not use CPAP   Past Surgical History:  Procedure Laterality Date   APPENDECTOMY     BACK SURGERY     5  back surgeries   CARDIAC CATHETERIZATION     CARDIOVERSION N/A 08/13/2018   Procedure: CARDIOVERSION;  Surgeon: Larey Dresser, MD;  Location: [facility] ENDOSCOPY;  Service: Cardiovascular;  Laterality: N/A;   CARDIOVERSION N/A 09/02/2018   Procedure: CARDIOVERSION;  Surgeon: Thayer Headings, MD;  Location: [facility] ENDOSCOPY;  Service: Cardiovascular;  Laterality: N/A;   CARPAL TUNNEL RELEASE     bil   COLON SURGERY     2004 for colon rupture   HERNIA REPAIR     LAPAROSCOPIC GASTRIC BANDING     LEFT HEART CATH AND CORONARY ANGIOGRAPHY N/A 07/21/2018   Procedure: LEFT HEART CATH AND CORONARY ANGIOGRAPHY;  Surgeon: Belva Crome, MD;  Location: [facility] CV LAB;  Service: Cardiovascular;  Laterality: N/A;   MANDIBLE FRACTURE SURGERY     x 2   PAROTIDECTOMY Right 11/19/2018   Procedure: PAROTIDECTOMY;  Surgeon: Melida Quitter, MD;  Location: [facility];  Service: ENT;  Laterality: Right;   ROBOTIC ASSITED PARTIAL NEPHRECTOMY Right 04/04/2020   Procedure: XI ROBOTIC ASSITED PARTIAL Kay;  Surgeon: Alexis Frock, MD;  Location: [facility] ORS;  Service:  Urology;  Laterality: Right;  3.5 HRS   TONSILLECTOMY      Current Outpatient Medications  Medication Sig Dispense Refill   atorvastatin (LIPITOR) 10 MG tablet Take 10 mg by mouth at bedtime.   1   B-D UF III MINI PEN NEEDLES 31G X 5 MM MISC USE WITH VICTOZA AS DIRECTED ONCE A DAY     carvedilol (COREG) 25 MG tablet TAKE 0.5 TABLETS (12.5 MG TOTAL) BY MOUTH IN THE MORNING AND AT BEDTIME. 90 tablet 1   dofetilide (TIKOSYN) 500 MCG capsule TAKE 1 CAPSULE BY MOUTH 2 TIMES DAILY. 180 capsule 2  ELIQUIS 5 MG TABS tablet TAKE 1 TABLET BY MOUTH TWICE A DAY 60 tablet 11   ENTRESTO 49-51 MG TAKE 1 TABLET BY MOUTH TWICE A DAY 180 tablet 3   glimepiride (AMARYL) 2 MG tablet Take 2 mg by mouth daily with breakfast.     glucose blood test strip USE TO TEST BLOOD SUGAR THREE TIMES A DAY     HYDROcodone-acetaminophen (NORCO) 10-325 MG tablet Take 1 tablet by mouth every 4 (four) hours as needed (for severe back pain.).     liraglutide (VICTOZA) 18 MG/3ML SOPN Inject 1.8 mg into the skin every evening.      magnesium gluconate (MAGONATE) 500 MG tablet Take 500 mg by mouth 2 (two) times daily.     metFORMIN (GLUCOPHAGE-XR) 500 MG 24 hr tablet Take 1,000 mg by mouth 2 (two) times daily.  1   Multiple Vitamin (ONE DAILY MULTIVITAMIN ADULT PO) Take 1 tablet by mouth daily.     Multiple Vitamins-Minerals (PRESERVISION AREDS PO) Take 1 capsule by mouth 2 (two) times daily.     pantoprazole (PROTONIX) 40 MG tablet Take 40 mg by mouth every morning.     spironolactone (ALDACTONE) 25 MG tablet TAKE 1/2 TABLET BY MOUTH EVERY DAY 45 tablet 1   tamsulosin (FLOMAX) 0.4 MG CAPS capsule Take 0.4 mg by mouth daily.     topiramate (TOPAMAX) 25 MG tablet Take 25 mg by mouth 2 (two) times daily.      No current facility-administered medications for this encounter.    Allergies  Allergen Reactions   Penicillins Hives    Has patient had a PCN reaction causing immediate rash, facial/tongue/throat swelling, SOB or  lightheadedness with hypotension: Unknown Has patient had a PCN reaction causing severe rash involving mucus membranes or skin necrosis: Unknown Has patient had a PCN reaction that required hospitalization: No Has patient had a PCN reaction occurring within the last 10 years: No Childhood reaction. If all of the above answers are "NO", then may proceed with Cephalosporin use.    Percocet [Oxycodone-Acetaminophen] Anxiety and Other (See Comments)    Hyperactivity & unhinged. Previously tolerated hydrocodone   Ace Inhibitors Other (See Comments)   Align Prebiotic-Probiotic Other (See Comments)   Gabapentin Other (See Comments)   Lansoprazole Other (See Comments)   Lopressor [Metoprolol] Other (See Comments)    Drops heart rate- caused Ed visit   Rofecoxib Other (See Comments)   Simvastatin Other (See Comments)   Sulfamethoxazole-Trimethoprim Other (See Comments)   Zoloft [Sertraline] Other (See Comments)   Codeine Anxiety    Social History   Socioeconomic History   Marital status: Married    Spouse name: Not on file   Number of children: Not on file   Years of education: Not on file   Highest education level: Not on file  Occupational History   Not on file  Tobacco Use   Smoking status: Former    Packs/day: 0.90    Years: 15.00    Pack years: 13.50    Types: Cigarettes    Quit date: 01/19/2006    Years since quitting: 16.0   Smokeless tobacco: Never  Vaping Use   Vaping Use: Never used  Substance and Sexual Activity   Alcohol use: No   Drug use: No   Sexual activity: Not on file  Other Topics Concern   Not on file  Social History Narrative   Not on file   Social Determinants of Health   Financial Resource Strain: Not on file  Food Insecurity: Not on file  Transportation Needs: Not on file  Physical Activity: Not on file  Stress: Not on file  Social Connections: Not on file  Intimate Partner Violence: Not on file    Family History  Problem Relation Age of  Onset   Diabetes Maternal Grandfather    Heart disease Neg Hx     ROS- All systems are reviewed and negative except as per the HPI above  Physical Exam: Vitals:   02/05/22 0956  BP: 140/74  Pulse: (!) 53  Weight: 99.5 kg  Height: 5' 10"  (1.778 m)   Wt Readings from Last 3 Encounters:  02/05/22 99.5 kg  01/29/22 100.2 kg  08/07/21 100.8 kg    Labs: Lab Results  Component Value Date   NA 137 01/30/2021   K 4.7 01/30/2021   CL 107 01/30/2021   CO2 23 01/30/2021   GLUCOSE 91 01/30/2021   BUN 31 (H) 01/30/2021   CREATININE 1.21 01/30/2021   CALCIUM 9.3 01/30/2021   MG 2.0 08/07/2021   Lab Results  Component Value Date   INR 1.11 11/19/2018   Lab Results  Component Value Date   CHOL 141 01/29/2022   HDL 49 01/29/2022   LDLCALC 78 01/29/2022   TRIG 71 01/29/2022     GEN- The patient is well appearing, alert and oriented x 3 today.   Head- normocephalic, atraumatic Eyes-  Sclera clear, conjunctiva pink Ears- hearing intact Oropharynx- clear Neck- supple, no JVP Lymph- no cervical lymphadenopathy Lungs- Clear to ausculation bilaterally, normal work of breathing Heart-regular rate and rhythm, no murmurs, rubs or gallops, PMI not laterally displaced GI- soft, NT, ND, + BS Extremities- no clubbing, cyanosis, or edema MS- no significant deformity or atrophy Skin- no rash or lesion Psych- euthymic mood, full affect Neuro- strength and sensation are intact  EKG- NSR at 53 bpm, pr int 184, qrs int 94 ms, qtc 409 ms Epic records reviewed Echo-Study Conclusions   - Left ventricle: The cavity size was mildly dilated. Wall   thickness was increased in a pattern of mild LVH. Systolic   function was moderately to severely reduced. The estimated   ejection fraction was in the range of 30% to 35%. Diffuse   hypokinesis. - Aortic valve: There was trivial regurgitation. - Mitral valve: There was mild regurgitation.   Impressions: Echo- 11/2019- Study Conclusions    - Left ventricle: The cavity size was normal. Systolic function was   normal. Wall motion was normal; there were no regional wall   motion abnormalities. Doppler parameters are consistent with   abnormal left ventricular relaxation (grade 1 diastolic   dysfunction). Doppler parameters are consistent with   indeterminate ventricular filling pressure. - Aortic valve: Transvalvular velocity was within the normal range.   There was no stenosis. There was mild regurgitation. - Aorta: Ascending aortic diameter: 37 mm (S). - Ascending aorta: The ascending aorta was mildly dilated. - Mitral valve: Transvalvular velocity was within the normal range.   There was no evidence for stenosis. There was no regurgitation. - Right ventricle: The cavity size was normal. Wall thickness was   normal. Systolic function was normal. - Atrial septum: No defect or patent foramen ovale was identified. - Tricuspid valve: Transvalvular velocity was within the normal   range. There was no regurgitation. - Pulmonary arteries: Systolic pressure could not be accurately   estimated. - Pericardium, extracardiac: A trivial pericardial effusion was   identified. - Global longitudinal strain -15.1% (mildly abnormal).  LHC- 8/21-Moderate three-vessel coronary artery disease. 60 to 70% mid RCA and 95% stenosis of the distal right coronary beyond the origin of the PDA. Normal left main Mid LAD 60 to 70% stenosis beyond the origin of the dominant first diagonal. Large ramus intermedius with luminal irregularity. Relatively small distribution circumflex with 70% obstruction in the first marginal and total occlusion of the distal circumflex before the origin of the small second obtuse marginal.  There are faint left to left collaterals to the distal circumflex. Moderately severe left ventricular dysfunction with global hypokinesis, EF 35%.  Normal LVEDP.     RECOMMENDATIONS:   In absence of significant/limiting anginal  symptoms, would recommend anti-ischemic therapy with beta-blockade and long-acting nitrates.  If symptoms develop or become refractory PCI of the very distal RCA, and LAD would be possible.  Lesions were not angiographically critical, and therefore based on data from the Courage trial, medical therapy would seem to be adequate. Recommend guideline directed therapy for systolic heart failure: Transition to Entresto from ARB, transition Tenormin to carvedilol, add mineralocorticoid receptor antagonist (Aldactone or eplerenone).  With reference to heart failure, diabetes management should include an SGLT 2 agent to decrease incidence of heart failure symptoms. Finally it may be helpful to have restoration of sinus rhythm.  LV dysfunction is out of proportion to the degree of coronary disease.     Recommend Aspirin 53m daily for moderate CAD.  Assessment and Plan: 1. Persistent  Afib Staying in rhythm with dofetilide Continue drug at 500 mcg bid Qtc stable at 409 ms  Bmet/mag Continue  OSA, on bipap  2.CHA2DS2VASc score of 3(htn,CAD,  DM)  Continue eliquis 5 mg bid   3. CAD / LV dysfunction  EF has normalized with restoring SR No anginal symptoms  Continue current meds   4. Rt renal CA S/p surgery 03/2020  Resolved   F/u here in 6 months for tikosyn surveillance   F/u with Dr. STamala Julianas scheduled   DGeroge Baseman Treanna Dumler, AS[facility]in[place] [phone]

## 2022-03-06 ENCOUNTER — Other Ambulatory Visit: Payer: Self-pay | Admitting: Interventional Cardiology

## 2022-03-21 DIAGNOSIS — E119 Type 2 diabetes mellitus without complications: Secondary | ICD-10-CM | POA: Diagnosis not present

## 2022-03-21 DIAGNOSIS — K432 Incisional hernia without obstruction or gangrene: Secondary | ICD-10-CM | POA: Diagnosis not present

## 2022-03-21 DIAGNOSIS — Z7984 Long term (current) use of oral hypoglycemic drugs: Secondary | ICD-10-CM | POA: Diagnosis not present

## 2022-03-21 DIAGNOSIS — Z01818 Encounter for other preprocedural examination: Secondary | ICD-10-CM | POA: Diagnosis not present

## 2022-04-02 DIAGNOSIS — Z905 Acquired absence of kidney: Secondary | ICD-10-CM | POA: Diagnosis not present

## 2022-04-02 DIAGNOSIS — K432 Incisional hernia without obstruction or gangrene: Secondary | ICD-10-CM | POA: Diagnosis present

## 2022-04-02 DIAGNOSIS — Z9884 Bariatric surgery status: Secondary | ICD-10-CM | POA: Diagnosis not present

## 2022-04-02 DIAGNOSIS — I480 Paroxysmal atrial fibrillation: Secondary | ICD-10-CM | POA: Diagnosis present

## 2022-04-02 DIAGNOSIS — I110 Hypertensive heart disease with heart failure: Secondary | ICD-10-CM | POA: Diagnosis present

## 2022-04-02 DIAGNOSIS — Z85528 Personal history of other malignant neoplasm of kidney: Secondary | ICD-10-CM | POA: Diagnosis not present

## 2022-04-02 DIAGNOSIS — E119 Type 2 diabetes mellitus without complications: Secondary | ICD-10-CM | POA: Diagnosis present

## 2022-04-02 DIAGNOSIS — E785 Hyperlipidemia, unspecified: Secondary | ICD-10-CM | POA: Diagnosis present

## 2022-04-02 DIAGNOSIS — I2510 Atherosclerotic heart disease of native coronary artery without angina pectoris: Secondary | ICD-10-CM | POA: Diagnosis present

## 2022-04-02 DIAGNOSIS — Z87891 Personal history of nicotine dependence: Secondary | ICD-10-CM | POA: Diagnosis not present

## 2022-04-02 DIAGNOSIS — I5022 Chronic systolic (congestive) heart failure: Secondary | ICD-10-CM | POA: Diagnosis present

## 2022-04-29 DIAGNOSIS — Z79899 Other long term (current) drug therapy: Secondary | ICD-10-CM | POA: Diagnosis not present

## 2022-04-29 DIAGNOSIS — K222 Esophageal obstruction: Secondary | ICD-10-CM | POA: Diagnosis not present

## 2022-04-29 DIAGNOSIS — Z7901 Long term (current) use of anticoagulants: Secondary | ICD-10-CM | POA: Diagnosis not present

## 2022-04-29 DIAGNOSIS — Z7984 Long term (current) use of oral hypoglycemic drugs: Secondary | ICD-10-CM | POA: Diagnosis not present

## 2022-04-29 DIAGNOSIS — E119 Type 2 diabetes mellitus without complications: Secondary | ICD-10-CM | POA: Diagnosis not present

## 2022-04-29 DIAGNOSIS — T18128A Food in esophagus causing other injury, initial encounter: Secondary | ICD-10-CM | POA: Diagnosis not present

## 2022-04-29 DIAGNOSIS — Z905 Acquired absence of kidney: Secondary | ICD-10-CM | POA: Diagnosis not present

## 2022-04-29 DIAGNOSIS — I10 Essential (primary) hypertension: Secondary | ICD-10-CM | POA: Diagnosis not present

## 2022-04-29 DIAGNOSIS — Z85528 Personal history of other malignant neoplasm of kidney: Secondary | ICD-10-CM | POA: Diagnosis not present

## 2022-04-29 DIAGNOSIS — I4891 Unspecified atrial fibrillation: Secondary | ICD-10-CM | POA: Diagnosis not present

## 2022-04-29 DIAGNOSIS — E785 Hyperlipidemia, unspecified: Secondary | ICD-10-CM | POA: Diagnosis not present

## 2022-05-30 DIAGNOSIS — R1319 Other dysphagia: Secondary | ICD-10-CM | POA: Diagnosis not present

## 2022-05-30 DIAGNOSIS — D126 Benign neoplasm of colon, unspecified: Secondary | ICD-10-CM | POA: Diagnosis not present

## 2022-06-22 ENCOUNTER — Other Ambulatory Visit: Payer: Self-pay | Admitting: Nurse Practitioner

## 2022-06-27 DIAGNOSIS — R1319 Other dysphagia: Secondary | ICD-10-CM | POA: Diagnosis not present

## 2022-06-27 DIAGNOSIS — E119 Type 2 diabetes mellitus without complications: Secondary | ICD-10-CM | POA: Diagnosis not present

## 2022-06-27 DIAGNOSIS — I4891 Unspecified atrial fibrillation: Secondary | ICD-10-CM | POA: Diagnosis not present

## 2022-06-27 DIAGNOSIS — R1314 Dysphagia, pharyngoesophageal phase: Secondary | ICD-10-CM | POA: Diagnosis not present

## 2022-07-21 DIAGNOSIS — D6869 Other thrombophilia: Secondary | ICD-10-CM | POA: Diagnosis not present

## 2022-07-21 DIAGNOSIS — K279 Peptic ulcer, site unspecified, unspecified as acute or chronic, without hemorrhage or perforation: Secondary | ICD-10-CM | POA: Diagnosis not present

## 2022-07-21 DIAGNOSIS — E1159 Type 2 diabetes mellitus with other circulatory complications: Secondary | ICD-10-CM | POA: Diagnosis not present

## 2022-07-21 DIAGNOSIS — E7800 Pure hypercholesterolemia, unspecified: Secondary | ICD-10-CM | POA: Diagnosis not present

## 2022-07-21 DIAGNOSIS — I10 Essential (primary) hypertension: Secondary | ICD-10-CM | POA: Diagnosis not present

## 2022-07-21 DIAGNOSIS — I700 Atherosclerosis of aorta: Secondary | ICD-10-CM | POA: Diagnosis not present

## 2022-07-21 DIAGNOSIS — I5022 Chronic systolic (congestive) heart failure: Secondary | ICD-10-CM | POA: Diagnosis not present

## 2022-07-21 DIAGNOSIS — I4891 Unspecified atrial fibrillation: Secondary | ICD-10-CM | POA: Diagnosis not present

## 2022-08-07 ENCOUNTER — Ambulatory Visit (HOSPITAL_COMMUNITY): Payer: Medicare Other | Admitting: Nurse Practitioner

## 2022-08-11 DIAGNOSIS — Z9884 Bariatric surgery status: Secondary | ICD-10-CM | POA: Diagnosis not present

## 2022-09-11 ENCOUNTER — Other Ambulatory Visit: Payer: Self-pay | Admitting: Interventional Cardiology

## 2022-09-11 DIAGNOSIS — J0141 Acute recurrent pansinusitis: Secondary | ICD-10-CM | POA: Diagnosis not present

## 2022-09-29 DIAGNOSIS — J0101 Acute recurrent maxillary sinusitis: Secondary | ICD-10-CM | POA: Diagnosis not present

## 2022-09-29 DIAGNOSIS — R051 Acute cough: Secondary | ICD-10-CM | POA: Diagnosis not present

## 2022-10-07 ENCOUNTER — Encounter (HOSPITAL_COMMUNITY): Payer: Self-pay | Admitting: Nurse Practitioner

## 2022-10-07 ENCOUNTER — Ambulatory Visit (HOSPITAL_COMMUNITY)
Admission: RE | Admit: 2022-10-07 | Discharge: 2022-10-07 | Disposition: A | Discharge status: Home / Self Care | Payer: Medicare Other | Source: Ambulatory Visit | Attending: Nurse Practitioner | Admitting: Nurse Practitioner

## 2022-10-07 VITALS — BP 140/74 | HR 52 | Ht 70.0 in | Wt 213.6 lb

## 2022-10-07 DIAGNOSIS — I2510 Atherosclerotic heart disease of native coronary artery without angina pectoris: Secondary | ICD-10-CM | POA: Diagnosis not present

## 2022-10-07 DIAGNOSIS — D6869 Other thrombophilia: Secondary | ICD-10-CM

## 2022-10-07 DIAGNOSIS — Z9884 Bariatric surgery status: Secondary | ICD-10-CM | POA: Insufficient documentation

## 2022-10-07 DIAGNOSIS — C641 Malignant neoplasm of right kidney, except renal pelvis: Secondary | ICD-10-CM | POA: Insufficient documentation

## 2022-10-07 DIAGNOSIS — E119 Type 2 diabetes mellitus without complications: Secondary | ICD-10-CM | POA: Insufficient documentation

## 2022-10-07 DIAGNOSIS — E669 Obesity, unspecified: Secondary | ICD-10-CM | POA: Insufficient documentation

## 2022-10-07 DIAGNOSIS — G4733 Obstructive sleep apnea (adult) (pediatric): Secondary | ICD-10-CM | POA: Insufficient documentation

## 2022-10-07 DIAGNOSIS — Z87442 Personal history of urinary calculi: Secondary | ICD-10-CM | POA: Diagnosis not present

## 2022-10-07 DIAGNOSIS — I4819 Other persistent atrial fibrillation: Secondary | ICD-10-CM | POA: Insufficient documentation

## 2022-10-07 LAB — BASIC METABOLIC PANEL
Anion gap: 8 (ref 5–15)
BUN: 21 mg/dL (ref 8–23)
CO2: 23 mmol/L (ref 22–32)
Calcium: 9.6 mg/dL (ref 8.9–10.3)
Chloride: 109 mmol/L (ref 98–111)
Creatinine, Ser: 1.07 mg/dL (ref 0.61–1.24)
GFR, Estimated: >60 mL/min (ref >60)
Glucose, Bld: 81 mg/dL (ref 70–99)
Potassium: 5 mmol/L (ref 3.5–5.1)
Sodium: 140 mmol/L (ref 135–145)

## 2022-10-07 LAB — MAGNESIUM: Magnesium: 1.9 mg/dL (ref 1.7–2.4)

## 2022-10-07 NOTE — Progress Notes (Signed)
Primary Care Physician: Orpah Melter, MD Referring Physician: Dr. Fransico Him   Casey [name]. is a 66 y.o. male with a h/o obesity, s/p gastric sleeve, HTN, DM that presented for pre op for rt parotidectomy 8/8 and was found to be in rate controlled afib, from which he was asymptomatic. He was sent to the afib clinic for evaluation. 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 He has felt some fatigue, less stamina for several months. Was just seen by PCP yesterday and nothing out of the ordinary was noted with his heart rhythm. He denies any exertional chest pain.   He does not drink alcohol, no tobacco use,no excessive caffeine. He has kept his weight stable for several years. Was almost 100 lbs heavier before gastric sleeve.  I discussed with Dr. Rayann Heman and he recommended an echo and if ok, he would be considered at low risk for surgery and could start anticoagulation after surgery. However, pt is now back in the office as his echo did show a reduced EF at 30-35%. Dr. Rayann Heman discussed with Dr. Redmond Baseman and it is felt most appropriate  to delay surgery in order to obtain  LHC to further determine his risk for surgery. He does have cardiac risk factors for CAD , ie , obesity,  HTN, DM. He denies any exertional chest pain but c/o fatigue/ low stamina  for several months/early summer. LHC did show 3 vessel  moderate  CAD  F/u in afib clinic, he is now been on anticoagulation x 2 weeks without interruption and can now schedule cardioversion after another week, he is in agreement.  F/u in afib clinic,9/20, he unfortunately had unsuccessful cardioversion and is back in the clinic to discuss antiarrythmic's.It was decided that he would come into the hospital for Tikosyn after he checked on the price of the drug.  F/u in afib clinic, 10/1, for admission for Tikosyn.  He will get the drug free for the rest  of the year as he had met his out of pocket deductible.   F/u in afib clinic 10/11. He is staying in [place] on [place]. He feels improved. His reduced EF meds have been titrated by Dr. Tamala Julian.  F/u in afib clinic,01/07/19. He is in SR and has been doing well staying in rhythm on dofetilide. He ultimately had his parotid gland successfully removed and did not have any issues with the surgery, he was benign. He had a sleep study and it was positive for OSA. He is suppose to pick up his equipment soon.He has had an echo since restoring SR and has shown normalization of EF.  F/u in afib clinic 10/22, he is staying in [place] with tikosyn. He has had neck surgeries and seeing some improvement in his pain.   F/u in afib clinic, 02/01/20. He continues to stay in [place] with Tikosyn. Continues on eliquis with a CHA2DS2VASc of 2. Had a back injection last week and came off eliquis x 3 days. He is anticipating  a surgery for a rod to be placed in his cervical spine in the near future. Has had both covid shots.  Here in afib clinic 08/02/20,  for dofetilide screening. He reports that he  is doing well without any afib burden. EKG shows SR with stable qt. He also reports have renal CA and had Rt partial nephrectomy in May. It was felt the surgery  was successful and he did not have to have any f/u radiation or  Chemo. He remained in SR during the surgery. He has lost around 39 lbs form the surgery, mostly as he did not have much appetite for several weeks following surgery.   F/u in afib clinic, 01/30/21. He has not noted any afib. Remains on tikosyn. He feels well. No changes in health. 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 No issues  with eliquis for a CHA2DS2VASc score of 2.   F/u afib clinic, 08/07/21. He has not noted any afib. Compliant with Tikosyn and anticoagulation. Passed a kidney stone a while back.   F/u in afib clinic, 02/05/22. He is doing well on  tikosyn. No afib noted. Qtc stable. States will need a hernia repair in May.   F/u in afib clinic for Tikosyn surveillance, 10/07/22. He is staying in [place]. 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 Qt is stable. Being compliant with tikosyn and eliquis.   Today, he denies symptoms of palpitations, chest pain, shortness of breath, orthopnea, PND, lower extremity edema, dizziness, presyncope, syncope, or neurologic sequela.  The patient is tolerating medications without difficulties and is otherwise without complaint today.   Past Medical History:  Diagnosis Date   Arthritis    Atrial fibrillation ([place]) 07/2018   Cancer ([place])    skin Ca on nose,kidney   Chronic kidney disease    Complication of anesthesia    diff. waking up   Diabetes mellitus    Dysrhythmia 07/2018   A-Fib   Headache(784.0)    Hypertension    dr Marisue [place]ble   pcp   Sleep apnea    prior to weight lose surgery-Dr. Don Perking not use CPAP   Past Surgical History:  Procedure Laterality Date   APPENDECTOMY     BACK SURGERY     5  back surgeries   CARDIAC CATHETERIZATION     CARDIOVERSION N/A 08/13/2018   Procedure: CARDIOVERSION;  Surgeon: Larey Dresser, MD;  Location: [facility] ENDOSCOPY;  Service: Cardiovascular;  Laterality: N/A;   CARDIOVERSION N/A 09/02/2018   Procedure: CARDIOVERSION;  Surgeon: Thayer Headings, MD;  Location: [facility] ENDOSCOPY;  Service: Cardiovascular;  Laterality: N/A;   CARPAL TUNNEL RELEASE     bil   COLON SURGERY     2004 for colon rupture   HERNIA REPAIR     LAPAROSCOPIC GASTRIC BANDING     LEFT HEART CATH AND CORONARY ANGIOGRAPHY N/A 07/21/2018   Procedure: LEFT HEART CATH AND CORONARY ANGIOGRAPHY;  Surgeon: Belva Crome, MD;  Location: [place] CV LAB;  Service: Cardiovascular;  Laterality: N/A;   MANDIBLE FRACTURE SURGERY     x 2   PAROTIDECTOMY Right 11/19/2018   Procedure: PAROTIDECTOMY;  Surgeon: Melida Quitter, MD;   Location: [place];  Service: ENT;  Laterality: Right;   ROBOTIC ASSITED PARTIAL NEPHRECTOMY Right 04/04/2020   Procedure: XI ROBOTIC ASSITED PARTIAL [place];  Surgeon: Alexis Frock, MD;  Location: [facility] ORS;  Service: Urology;  Laterality: Right;  3.5 HRS   TONSILLECTOMY      Current Outpatient Medications  Medication Sig Dispense Refill   atorvastatin (LIPITOR) 10 MG tablet Take 10 mg by mouth at bedtime.   1   B-D UF III MINI PEN NEEDLES 31G X 5 MM MISC USE WITH VICTOZA AS DIRECTED ONCE A DAY  carvedilol (COREG) 25 MG tablet Take 1 tablet (25 mg total) by mouth daily. (Patient taking differently: Take 12.5 mg by mouth 2 (two) times daily.) 90 tablet 3   Docusate Sodium (DSS) 100 MG CAPS Take 2 capsules by mouth as needed.     dofetilide (TIKOSYN) 500 MCG capsule TAKE 1 CAPSULE BY MOUTH TWICE A DAY 180 capsule 2   doxycycline (VIBRA-TABS) 100 MG tablet Take 100 mg by mouth 2 (two) times daily.     ELIQUIS 5 MG TABS tablet TAKE 1 TABLET BY MOUTH TWICE A DAY 60 tablet 11   ENTRESTO 49-51 MG TAKE 1 TABLET BY MOUTH TWICE A DAY 180 tablet 3   glimepiride (AMARYL) 2 MG tablet Take 2 mg by mouth daily with breakfast.     glucose blood test strip USE TO TEST BLOOD SUGAR THREE TIMES A DAY     HYDROcodone-acetaminophen (NORCO) 10-325 MG tablet Take 1 tablet by mouth every 4 (four) hours as needed (for severe back pain.).     magnesium gluconate (MAGONATE) 500 MG tablet Take 500 mg by mouth 2 (two) times daily.     metFORMIN (GLUCOPHAGE-XR) 500 MG 24 hr tablet Take 1,000 mg by mouth 2 (two) times daily.  1   Multiple Vitamin (ONE DAILY MULTIVITAMIN ADULT PO) Take 1 tablet by mouth daily.     Multiple Vitamins-Minerals (PRESERVISION AREDS PO) Take 1 capsule by mouth 2 (two) times daily.     pantoprazole (PROTONIX) 40 MG tablet Take 40 mg by mouth every morning.     spironolactone (ALDACTONE) 25 MG tablet Take 0.5 tablets (12.5 mg total) by mouth daily. 45 tablet 3   tamsulosin  (FLOMAX) 0.4 MG CAPS capsule Take 0.4 mg by mouth daily.     topiramate (TOPAMAX) 25 MG tablet Take 25 mg by mouth 2 (two) times daily.      No current facility-administered medications for this encounter.    Allergies  Allergen Reactions   Bifidobacterium Other (See Comments)    Other Reaction(s): Abdominal Pain, GI Upset (intolerance), stomach upset   Penicillins Hives    Has patient had a PCN reaction causing immediate rash, facial/tongue/throat swelling, SOB or lightheadedness with hypotension: Unknown Has patient had a PCN reaction causing severe rash involving mucus membranes or skin necrosis: Unknown Has patient had a PCN reaction that required hospitalization: No Has patient had a PCN reaction occurring within the last 10 years: No Childhood reaction. If all of the above answers are "NO", then may proceed with Cephalosporin use.    Percocet [Oxycodone-Acetaminophen] Anxiety and Other (See Comments)    Hyperactivity & unhinged. Previously tolerated hydrocodone   Ace Inhibitors Other (See Comments)   Align Prebiotic-Probiotic Other (See Comments)   Gabapentin Other (See Comments)   Lansoprazole Other (See Comments)   Lopressor [Metoprolol] Other (See Comments)    Drops heart rate- caused Ed visit   Rofecoxib Other (See Comments)   Simvastatin Other (See Comments)   Sulfamethoxazole-Trimethoprim Other (See Comments)   Zoloft [Sertraline] Other (See Comments)   Codeine Anxiety   Oxycodone Anxiety    Social History   Socioeconomic History   Marital status: Married    Spouse name: Not on file   Number of children: Not on file   Years of education: Not on file   Highest education level: Not on file  Occupational History   Not on file  Tobacco Use   Smoking status: Former    Packs/day: 0.90    Years: 15.00  Total pack years: 13.50    Types: Cigarettes    Quit date: 01/19/2006    Years since quitting: 16.7   Smokeless tobacco: Never  Vaping Use   Vaping Use:  Never used  Substance and Sexual Activity   Alcohol use: No   Drug use: No   Sexual activity: Not on file  Other Topics Concern   Not on file  Social History Narrative   Not on file   Social Determinants of Health   Financial Resource Strain: Not on file  Food Insecurity: Not on file  Transportation Needs: Not on file  Physical Activity: Not on file  Stress: Not on file  Social Connections: Not on file  Intimate Partner Violence: Not on file    Family History  Problem Relation Age of Onset   Diabetes Maternal Grandfather    Heart disease Neg Hx     ROS- All systems are reviewed and negative except as per the HPI above  Physical Exam: Vitals:   10/07/22 1026  BP: (!) 140/74  Pulse: (!) 52  Weight: 96.9 kg  Height: _0  (1.778 m)   Wt Readings from Last 3 Encounters:  10/07/22 96.9 kg  02/05/22 99.5 kg  01/29/22 100.2 kg    Labs: Lab Results  Component Value Date   NA 139 02/05/2022   K 4.3 02/05/2022   CL 108 02/05/2022   CO2 25 02/05/2022   GLUCOSE 93 02/05/2022   BUN 17 02/05/2022   CREATININE 1.09 02/05/2022   CALCIUM 9.0 02/05/2022   MG 1.9 02/05/2022   Lab Results  Component Value Date   INR 1.11 11/19/2018   Lab Results  Component Value Date   CHOL 141 01/29/2022   HDL 49 01/29/2022   LDLCALC 78 01/29/2022   TRIG 71 01/29/2022     GEN- The patient is well appearing, alert and oriented x 3 today.   Head- normocephalic, atraumatic Eyes-  Sclera clear, conjunctiva pink Ears- hearing intact Oropharynx- clear Neck- supple, no JVP Lymph- no cervical lymphadenopathy Lungs- Clear to ausculation bilaterally, normal work of breathing Heart-regular rate and rhythm, no murmurs, rubs or gallops, PMI not laterally displaced GI- soft, NT, ND, + BS Extremities- no clubbing, cyanosis, or edema MS- no significant deformity or atrophy Skin- no rash or lesion Psych- euthymic mood, full affect Neuro- strength and sensation are intact   EKG-Vent.  rate 52 BPM PR interval 178 ms QRS duration 98 ms QT/QTcB 446/414 ms P-R-T axes 10 -4 21 Sinus bradycardia with sinus arrhythmia Cannot rule out Anterior infarct , age undetermined Abnormal ECG When compared with ECG of 05-Feb-2022 10:16, PREVIOUS ECG IS PRESENT  Epic records reviewed Echo-Study Conclusions   - Left ventricle: The cavity size was mildly dilated. Wall   thickness was increased in a pattern of mild LVH. Systolic   function was moderately to severely reduced. The estimated   ejection fraction was in the range of 30% to 35%. Diffuse   hypokinesis. - Aortic valve: There was trivial regurgitation. - Mitral valve: There was mild regurgitation.   Impressions: Echo- 11/2019- Study Conclusions   - Left ventricle: The cavity size was normal. Systolic function was   normal. Wall motion was normal; there were no regional wall   motion abnormalities. Doppler parameters are consistent with   abnormal left ventricular relaxation (grade 1 diastolic   dysfunction). Doppler parameters are consistent with   indeterminate ventricular filling pressure. - Aortic valve: Transvalvular velocity was within the normal range.  There was no stenosis. There was mild regurgitation. - Aorta: Ascending aortic diameter: 37 mm (S). - Ascending aorta: The ascending aorta was mildly dilated. - Mitral valve: Transvalvular velocity was within the normal range.   There was no evidence for stenosis. There was no regurgitation. - Right ventricle: The cavity size was normal. Wall thickness was   normal. Systolic function was normal. - Atrial septum: No defect or patent foramen ovale was identified. - Tricuspid valve: Transvalvular velocity was within the normal   range. There was no regurgitation. - Pulmonary arteries: Systolic pressure could not be accurately   estimated. - Pericardium, extracardiac: A trivial pericardial effusion was   identified. - Global longitudinal strain -15.1% (mildly  abnormal).   LHC- 8/21-Moderate three-vessel coronary artery disease. 60 to 70% mid RCA and 95% stenosis of the distal right coronary beyond the origin of the PDA. Normal left main Mid LAD 60 to 70% stenosis beyond the origin of the dominant first diagonal. Large ramus intermedius with luminal irregularity. Relatively small distribution circumflex with 70% obstruction in the first marginal and total occlusion of the distal circumflex before the origin of the small second obtuse marginal.  There are faint left to left collaterals to the distal circumflex. Moderately severe left ventricular dysfunction with global hypokinesis, EF 35%.  Normal LVEDP.        Recommend Aspirin 21m daily for moderate CAD.  Assessment and Plan: 1. Persistent  Afib Staying in rhythm with dofetilide Continue drug at 500 mcg bid Qtc stable  at 414 ms Bmet/mag Continue  OSA, on bipap  2.CHA2DS2VASc score of 3(htn,CAD,  DM)  Continue eliquis 5 mg bid   3. CAD / LV dysfunction  EF has normalized with restoring SR No anginal symptoms  Continue current meds  Per Dr. STamala Julian  4. Rt renal CA S/p surgery 03/2020  Resolved   F/u here in 6 months for tikosyn surveillance   F/u with Dr. STamala Julianas scheduled   DGeroge Baseman Suanne Minahan, AWabasha Hospital138 Andover StreetGRio Rancho Rewey [phone]

## 2022-10-09 DIAGNOSIS — R1319 Other dysphagia: Secondary | ICD-10-CM | POA: Diagnosis not present

## 2022-10-09 DIAGNOSIS — D126 Benign neoplasm of colon, unspecified: Secondary | ICD-10-CM | POA: Diagnosis not present

## 2022-10-17 ENCOUNTER — Ambulatory Visit (HOSPITAL_COMMUNITY)
Admission: RE | Admit: 2022-10-17 | Discharge: 2022-10-17 | Disposition: A | Discharge status: Home / Self Care | Payer: Medicare Other | Source: Ambulatory Visit | Attending: Urology | Admitting: Urology

## 2022-10-17 ENCOUNTER — Other Ambulatory Visit (HOSPITAL_COMMUNITY): Payer: Self-pay | Admitting: Urology

## 2022-10-17 DIAGNOSIS — Z87442 Personal history of urinary calculi: Secondary | ICD-10-CM | POA: Diagnosis not present

## 2022-10-17 DIAGNOSIS — C641 Malignant neoplasm of right kidney, except renal pelvis: Secondary | ICD-10-CM | POA: Diagnosis not present

## 2022-10-17 DIAGNOSIS — Z85528 Personal history of other malignant neoplasm of kidney: Secondary | ICD-10-CM | POA: Diagnosis not present

## 2022-10-17 DIAGNOSIS — R1084 Generalized abdominal pain: Secondary | ICD-10-CM | POA: Diagnosis not present

## 2022-10-17 DIAGNOSIS — Z125 Encounter for screening for malignant neoplasm of prostate: Secondary | ICD-10-CM | POA: Diagnosis not present

## 2022-10-17 DIAGNOSIS — Z9889 Other specified postprocedural states: Secondary | ICD-10-CM | POA: Diagnosis not present

## 2022-10-17 DIAGNOSIS — N289 Disorder of kidney and ureter, unspecified: Secondary | ICD-10-CM | POA: Diagnosis not present

## 2022-10-17 DIAGNOSIS — I771 Stricture of artery: Secondary | ICD-10-CM | POA: Diagnosis not present

## 2022-10-17 DIAGNOSIS — I700 Atherosclerosis of aorta: Secondary | ICD-10-CM | POA: Diagnosis not present

## 2022-10-19 DIAGNOSIS — R001 Bradycardia, unspecified: Secondary | ICD-10-CM | POA: Diagnosis not present

## 2022-10-19 DIAGNOSIS — I2510 Atherosclerotic heart disease of native coronary artery without angina pectoris: Secondary | ICD-10-CM | POA: Diagnosis not present

## 2022-10-19 DIAGNOSIS — I7121 Aneurysm of the ascending aorta, without rupture: Secondary | ICD-10-CM | POA: Diagnosis not present

## 2022-10-19 DIAGNOSIS — I10 Essential (primary) hypertension: Secondary | ICD-10-CM | POA: Diagnosis not present

## 2022-10-19 DIAGNOSIS — Z888 Allergy status to other drugs, medicaments and biological substances status: Secondary | ICD-10-CM | POA: Diagnosis not present

## 2022-10-19 DIAGNOSIS — E119 Type 2 diabetes mellitus without complications: Secondary | ICD-10-CM | POA: Diagnosis not present

## 2022-10-19 DIAGNOSIS — Z7982 Long term (current) use of aspirin: Secondary | ICD-10-CM | POA: Diagnosis not present

## 2022-10-19 DIAGNOSIS — Z1152 Encounter for screening for COVID-19: Secondary | ICD-10-CM | POA: Diagnosis not present

## 2022-10-19 DIAGNOSIS — E785 Hyperlipidemia, unspecified: Secondary | ICD-10-CM | POA: Diagnosis not present

## 2022-10-19 DIAGNOSIS — I7090 Unspecified atherosclerosis: Secondary | ICD-10-CM | POA: Diagnosis not present

## 2022-10-19 DIAGNOSIS — I959 Hypotension, unspecified: Secondary | ICD-10-CM | POA: Diagnosis not present

## 2022-10-19 DIAGNOSIS — Z79899 Other long term (current) drug therapy: Secondary | ICD-10-CM | POA: Diagnosis not present

## 2022-10-19 DIAGNOSIS — N179 Acute kidney failure, unspecified: Secondary | ICD-10-CM | POA: Diagnosis not present

## 2022-10-19 DIAGNOSIS — R0600 Dyspnea, unspecified: Secondary | ICD-10-CM | POA: Diagnosis not present

## 2022-10-19 DIAGNOSIS — D649 Anemia, unspecified: Secondary | ICD-10-CM | POA: Diagnosis not present

## 2022-10-19 DIAGNOSIS — R0602 Shortness of breath: Secondary | ICD-10-CM | POA: Diagnosis not present

## 2022-10-19 DIAGNOSIS — R1084 Generalized abdominal pain: Secondary | ICD-10-CM | POA: Diagnosis not present

## 2022-10-19 DIAGNOSIS — I714 Abdominal aortic aneurysm, without rupture, unspecified: Secondary | ICD-10-CM | POA: Diagnosis not present

## 2022-10-19 DIAGNOSIS — Z20822 Contact with and (suspected) exposure to covid-19: Secondary | ICD-10-CM | POA: Diagnosis not present

## 2022-10-19 DIAGNOSIS — I4891 Unspecified atrial fibrillation: Secondary | ICD-10-CM | POA: Diagnosis not present

## 2022-10-19 DIAGNOSIS — Z880 Allergy status to penicillin: Secondary | ICD-10-CM | POA: Diagnosis not present

## 2022-10-19 DIAGNOSIS — R509 Fever, unspecified: Secondary | ICD-10-CM | POA: Diagnosis not present

## 2022-10-19 DIAGNOSIS — Z7901 Long term (current) use of anticoagulants: Secondary | ICD-10-CM | POA: Diagnosis not present

## 2022-10-20 DIAGNOSIS — Z125 Encounter for screening for malignant neoplasm of prostate: Secondary | ICD-10-CM | POA: Diagnosis not present

## 2022-10-20 DIAGNOSIS — C641 Malignant neoplasm of right kidney, except renal pelvis: Secondary | ICD-10-CM | POA: Diagnosis not present

## 2022-10-20 DIAGNOSIS — R3915 Urgency of urination: Secondary | ICD-10-CM | POA: Diagnosis not present

## 2022-10-20 DIAGNOSIS — N5201 Erectile dysfunction due to arterial insufficiency: Secondary | ICD-10-CM | POA: Diagnosis not present

## 2022-10-21 ENCOUNTER — Telehealth: Payer: Self-pay | Admitting: Interventional Cardiology

## 2022-10-21 NOTE — Telephone Encounter (Signed)
Received patient assistance application for Entresto. Completed provider's portion, signed and faxed to [employer].

## 2022-10-21 NOTE — Telephone Encounter (Signed)
Called and spoke with patient to inform him we have received his patient assistance application for [facility] and it has been signed and faxed in to [employer].  Patient states he was seen in the ED over the weekend for hypotension, also c/o SOB and blurred vision. He states labs/EKG/scans in ED were normal. Patient states his hemoglobin was 8.4 in ED, but no active bleeding found. Patient to follow-up with PCP to recheck CBC.  Offered patient next available appointment with one of our APPs, patient requested to see Dr. Tamala Julian. Offered next available appt on 11/13/22 at 9:40am. Patient took this appointment time. Informed patient that he still has the option to be seen sooner by an APP, also advised on ED precautions.  Patient verbalized understanding.  Patient also states he only has 5 days left of Entresto. 2 week sample left at front desk for patient to pick-up.

## 2022-10-25 DIAGNOSIS — N182 Chronic kidney disease, stage 2 (mild): Secondary | ICD-10-CM | POA: Diagnosis not present

## 2022-10-25 DIAGNOSIS — E785 Hyperlipidemia, unspecified: Secondary | ICD-10-CM | POA: Diagnosis not present

## 2022-10-25 DIAGNOSIS — K219 Gastro-esophageal reflux disease without esophagitis: Secondary | ICD-10-CM | POA: Diagnosis not present

## 2022-10-25 DIAGNOSIS — N400 Enlarged prostate without lower urinary tract symptoms: Secondary | ICD-10-CM | POA: Diagnosis not present

## 2022-10-25 DIAGNOSIS — E669 Obesity, unspecified: Secondary | ICD-10-CM | POA: Diagnosis not present

## 2022-10-25 DIAGNOSIS — Z791 Long term (current) use of non-steroidal anti-inflammatories (NSAID): Secondary | ICD-10-CM | POA: Diagnosis not present

## 2022-10-25 DIAGNOSIS — K259 Gastric ulcer, unspecified as acute or chronic, without hemorrhage or perforation: Secondary | ICD-10-CM | POA: Diagnosis not present

## 2022-10-25 DIAGNOSIS — Z86010 Personal history of colonic polyps: Secondary | ICD-10-CM | POA: Diagnosis not present

## 2022-10-25 DIAGNOSIS — I4891 Unspecified atrial fibrillation: Secondary | ICD-10-CM | POA: Diagnosis not present

## 2022-10-25 DIAGNOSIS — R001 Bradycardia, unspecified: Secondary | ICD-10-CM | POA: Diagnosis not present

## 2022-10-25 DIAGNOSIS — K921 Melena: Secondary | ICD-10-CM | POA: Diagnosis not present

## 2022-10-25 DIAGNOSIS — K5730 Diverticulosis of large intestine without perforation or abscess without bleeding: Secondary | ICD-10-CM | POA: Diagnosis not present

## 2022-10-25 DIAGNOSIS — K254 Chronic or unspecified gastric ulcer with hemorrhage: Secondary | ICD-10-CM | POA: Diagnosis not present

## 2022-10-25 DIAGNOSIS — Z7982 Long term (current) use of aspirin: Secondary | ICD-10-CM | POA: Diagnosis not present

## 2022-10-25 DIAGNOSIS — K5791 Diverticulosis of intestine, part unspecified, without perforation or abscess with bleeding: Secondary | ICD-10-CM | POA: Diagnosis not present

## 2022-10-25 DIAGNOSIS — Z7984 Long term (current) use of oral hypoglycemic drugs: Secondary | ICD-10-CM | POA: Diagnosis not present

## 2022-10-25 DIAGNOSIS — H93A9 Pulsatile tinnitus, unspecified ear: Secondary | ICD-10-CM | POA: Diagnosis not present

## 2022-10-25 DIAGNOSIS — H93A3 Pulsatile tinnitus, bilateral: Secondary | ICD-10-CM | POA: Diagnosis not present

## 2022-10-25 DIAGNOSIS — R195 Other fecal abnormalities: Secondary | ICD-10-CM | POA: Diagnosis not present

## 2022-10-25 DIAGNOSIS — D62 Acute posthemorrhagic anemia: Secondary | ICD-10-CM | POA: Diagnosis not present

## 2022-10-25 DIAGNOSIS — I10 Essential (primary) hypertension: Secondary | ICD-10-CM | POA: Diagnosis not present

## 2022-10-25 DIAGNOSIS — E1122 Type 2 diabetes mellitus with diabetic chronic kidney disease: Secondary | ICD-10-CM | POA: Diagnosis not present

## 2022-10-25 DIAGNOSIS — R531 Weakness: Secondary | ICD-10-CM | POA: Diagnosis not present

## 2022-10-25 DIAGNOSIS — I129 Hypertensive chronic kidney disease with stage 1 through stage 4 chronic kidney disease, or unspecified chronic kidney disease: Secondary | ICD-10-CM | POA: Diagnosis not present

## 2022-10-25 DIAGNOSIS — Z905 Acquired absence of kidney: Secondary | ICD-10-CM | POA: Diagnosis not present

## 2022-10-25 DIAGNOSIS — K922 Gastrointestinal hemorrhage, unspecified: Secondary | ICD-10-CM | POA: Diagnosis not present

## 2022-10-25 DIAGNOSIS — Z87821 Personal history of retained foreign body fully removed: Secondary | ICD-10-CM | POA: Diagnosis not present

## 2022-10-25 DIAGNOSIS — E119 Type 2 diabetes mellitus without complications: Secondary | ICD-10-CM | POA: Diagnosis not present

## 2022-10-25 DIAGNOSIS — K269 Duodenal ulcer, unspecified as acute or chronic, without hemorrhage or perforation: Secondary | ICD-10-CM | POA: Diagnosis not present

## 2022-10-25 DIAGNOSIS — Z7901 Long term (current) use of anticoagulants: Secondary | ICD-10-CM | POA: Diagnosis not present

## 2022-10-25 DIAGNOSIS — R55 Syncope and collapse: Secondary | ICD-10-CM | POA: Diagnosis not present

## 2022-11-04 DIAGNOSIS — K254 Chronic or unspecified gastric ulcer with hemorrhage: Secondary | ICD-10-CM | POA: Diagnosis not present

## 2022-11-04 DIAGNOSIS — R600 Localized edema: Secondary | ICD-10-CM | POA: Diagnosis not present

## 2022-11-04 DIAGNOSIS — I10 Essential (primary) hypertension: Secondary | ICD-10-CM | POA: Diagnosis not present

## 2022-11-06 NOTE — Telephone Encounter (Signed)
**Note De-Identified Casey [name] Obfuscation** Letter received [name] fax from [employer] stating that thy have approved the pt for Rntresto assistance until 12/01/2023. Pt [id number]  The letter states that they have notified the pt of this approval as well.

## 2022-11-10 DIAGNOSIS — K251 Acute gastric ulcer with perforation: Secondary | ICD-10-CM | POA: Diagnosis not present

## 2022-11-10 DIAGNOSIS — D500 Iron deficiency anemia secondary to blood loss (chronic): Secondary | ICD-10-CM | POA: Diagnosis not present

## 2022-11-10 DIAGNOSIS — K2211 Ulcer of esophagus with bleeding: Secondary | ICD-10-CM | POA: Diagnosis not present

## 2022-11-12 NOTE — Progress Notes (Signed)
Cardiology Office Note:    Date:  11/13/2022   ID:  Casey Garbe., DOB 02/17/1956, MRN [id number]  PCP:  Casey [name], Casey [name]  Cardiologist:  Casey [name], Casey [name]   Referring Casey [name]: Casey [name], Casey [name]   Chief Complaint  Patient presents with   Congestive Heart Failure   Cardiac Valve Problem    History of Present Illness:    Casey Breit. is a 66 y.o. male with a hx of atrial fibrillation, systolic left ventricular dysfunction with EF less than 35%--> 65% after conversion and addition of Tikosyn and GDMT for HF, and further w/u showed no obstructive CAD.    He is doing well.  Atrial fibrillation has been under control on Tikosyn.  Having difficulty affording his myriad of medications including Eliquis, and Tikosyn.  He has gotten some relief on Entresto from the company.  He needs to remain on his GDM therapy for systolic heart failure.  Past Medical History:  Diagnosis Date   Arthritis    Atrial fibrillation (Casey [name]) 07/2018   Cancer ([place])    skin Ca on nose,kidney   Chronic kidney disease    Complication of anesthesia    diff. waking up   Diabetes mellitus    Dysrhythmia 07/2018   A-Fib   Headache(784.0)    Hypertension    dr Casey [name]   pcp   Sleep apnea    prior to weight lose surgery-Dr. Don [name] not use CPAP    Past Surgical History:  Procedure Laterality Date   APPENDECTOMY     BACK SURGERY     5  back surgeries   CARDIAC CATHETERIZATION     CARDIOVERSION N/A 08/13/2018   Procedure: CARDIOVERSION;  Surgeon: Larey Dresser, Casey [name];  Location: [facility] ENDOSCOPY;  Service: Cardiovascular;  Laterality: N/A;   CARDIOVERSION N/A 09/02/2018   Procedure: CARDIOVERSION;  Surgeon: Thayer Headings, Casey [name];  Location: [facility] ENDOSCOPY;  Service: Cardiovascular;  Laterality: N/A;   CARPAL TUNNEL RELEASE     bil   COLON SURGERY     2004 for colon rupture   HERNIA REPAIR     LAPAROSCOPIC GASTRIC BANDING     LEFT HEART CATH AND CORONARY ANGIOGRAPHY N/A 07/21/2018    Procedure: LEFT HEART CATH AND CORONARY ANGIOGRAPHY;  Surgeon: Belva Crome, Casey [name];  Location: [place] CV LAB;  Service: Cardiovascular;  Laterality: N/A;   MANDIBLE FRACTURE SURGERY     x 2   PAROTIDECTOMY Right 11/19/2018   Procedure: PAROTIDECTOMY;  Surgeon: Melida Quitter, Casey [name];  Location: [place];  Service: ENT;  Laterality: Right;   ROBOTIC ASSITED PARTIAL NEPHRECTOMY Right 04/04/2020   Procedure: XI ROBOTIC ASSITED PARTIAL [place];  Surgeon: Alexis Frock, Casey [name];  Location: [facility] ORS;  Service: Urology;  Laterality: Right;  3.5 HRS   TONSILLECTOMY      Current Medications: Current Meds  Medication Sig   atorvastatin (LIPITOR) 10 MG tablet Take 10 mg by mouth at bedtime.    B-D UF III MINI PEN NEEDLES 31G X 5 MM MISC USE WITH VICTOZA AS DIRECTED ONCE A DAY   carvedilol (COREG) 6.25 MG tablet Take 6.25 mg by mouth 2 (two) times daily with a meal.   Docusate Sodium (DSS) 100 MG CAPS Take 2 capsules by mouth as needed.   dofetilide (TIKOSYN) 500 MCG capsule TAKE 1 CAPSULE BY MOUTH TWICE A DAY   doxycycline (VIBRA-TABS) 100 MG tablet Take 100 mg by mouth 2 (two) times daily.   ELIQUIS 5 MG  TABS tablet TAKE 1 TABLET BY MOUTH TWICE A DAY   ENTRESTO 49-51 MG TAKE 1 TABLET BY MOUTH TWICE A DAY   ferrous sulfate 325 (65 FE) MG tablet 325 mg 2 (two) times daily with a meal.   glimepiride (AMARYL) 2 MG tablet Take 2 mg by mouth daily with breakfast.   glucose blood test strip USE TO TEST BLOOD SUGAR THREE TIMES A DAY   HYDROcodone-acetaminophen (NORCO) 10-325 MG tablet Take 1 tablet by mouth every 4 (four) hours as needed (for severe back pain.).   magnesium gluconate (MAGONATE) 500 MG tablet Take 500 mg by mouth 2 (two) times daily.   metFORMIN (GLUCOPHAGE-XR) 500 MG 24 hr tablet Take 1,000 mg by mouth 2 (two) times daily.   Multiple Vitamin (ONE DAILY MULTIVITAMIN ADULT PO) Take 1 tablet by mouth daily.   Multiple Vitamins-Minerals (PRESERVISION AREDS PO) Take 1 capsule by  mouth 2 (two) times daily.   pantoprazole (PROTONIX) 40 MG tablet Take 40 mg by mouth every morning.   spironolactone (ALDACTONE) 25 MG tablet Take 0.5 tablets (12.5 mg total) by mouth daily.   tamsulosin (FLOMAX) 0.4 MG CAPS capsule Take 0.4 mg by mouth daily.   topiramate (TOPAMAX) 25 MG tablet Take 25 mg by mouth 2 (two) times daily.      Allergies:   Bifidobacterium, Penicillins, Percocet [oxycodone-acetaminophen], Ace inhibitors, Align prebiotic-probiotic, Gabapentin, Lansoprazole, Lopressor [metoprolol], Rofecoxib, Simvastatin, Sulfamethoxazole-trimethoprim, Zoloft [sertraline], Codeine, and Oxycodone   Social History   Socioeconomic History   Marital status: Married    Spouse name: Not on file   Number of children: Not on file   Years of education: Not on file   Highest education level: Not on file  Occupational History   Not on file  Tobacco Use   Smoking status: Former    Packs/day: 0.90    Years: 15.00    Total pack years: 13.50    Types: Cigarettes    Quit date: 01/19/2006    Years since quitting: 16.8   Smokeless tobacco: Never  Vaping Use   Vaping Use: Never used  Substance and Sexual Activity   Alcohol use: No   Drug use: No   Sexual activity: Not on file  Other Topics Concern   Not on file  Social History Narrative   Not on file   Social Determinants of Health   Financial Resource Strain: Not on file  Food Insecurity: Not on file  Transportation Needs: Not on file  Physical Activity: Not on file  Stress: Not on file  Social Connections: Not on file     Family History: The patient's family history includes Diabetes in his maternal grandfather. There is no history of Heart disease.  ROS:   Please see the history of present illness.    No new data all other systems reviewed and are negative.  EKGs/Labs/Other Studies Reviewed:    The following studies were reviewed today: No new imaging  EKG:  EKG thanks sinus rhythm, QTc 438 ms.  Overall  normal  Recent Labs: 01/29/2022: ALT 14 10/07/2022: BUN 21; Creatinine, Ser 1.07; Magnesium 1.9; Potassium 5.0; Sodium 140  Recent Lipid Panel    Component Value Date/Time   CHOL 141 01/29/2022 1009   TRIG 71 01/29/2022 1009   HDL 49 01/29/2022 1009   CHOLHDL 2.9 01/29/2022 1009   LDLCALC 78 01/29/2022 1009    Physical Exam:    VS:  BP 136/70   Pulse 61   Ht _0  (1.778 m)  Wt 210 lb 12.8 oz (95.6 kg)   SpO2 97%   BMI 30.25 kg/m     Wt Readings from Last 3 Encounters:  11/13/22 210 lb 12.8 oz (95.6 kg)  10/07/22 213 lb 9.6 oz (96.9 kg)  02/05/22 219 lb 6.4 oz (99.5 kg)     GEN: Obese. No acute distress HEENT: Normal NECK: No JVD. LYMPHATICS: No lymphadenopathy CARDIAC: No murmur. RRR no gallop, or edema. VASCULAR:  Normal Pulses. No bruits. RESPIRATORY:  Clear to auscultation without rales, wheezing or rhonchi  ABDOMEN: Soft, non-tender, non-distended, No pulsatile mass, MUSCULOSKELETAL: No deformity  SKIN: Warm and dry NEUROLOGIC:  Alert and oriented x 3 PSYCHIATRIC:  Normal affect   ASSESSMENT:    1. Coronary artery disease involving native coronary artery of native heart without angina pectoris   2. Chronic systolic heart failure (HCC)   3. Persistent atrial fibrillation ([facility])   4. Aortic dilatation (HCC)   5. Obstructive sleep apnea (adult) (pediatric)   6. Hyperlipidemia with target LDL less than 70    PLAN:    In order of problems listed above:  Secondary prevention reviewed.  Encouraged aerobic activity.  Continue guideline directed therapy for heart failure (Entresto, carvedilol, spironolactone) Continue Tikosyn therapy.  We will file for industry exception relative to cost. Did not discuss He is compliant with CPAP Continue statin therapy   Overall education and awareness concerning primary/secondary risk prevention was discussed in detail: LDL less than 70, hemoglobin A1c less than 7, blood pressure target less than 130/80 mmHg, >150 minutes  of moderate aerobic activity per week, avoidance of smoking, weight control (via diet and exercise), and continued surveillance/management of/for obstructive sleep apnea.    Medication Adjustments/Labs and Tests Ordered: Current medicines are reviewed at length with the patient today.  Concerns regarding medicines are outlined above.  Orders Placed This Encounter  Procedures   EKG 12-Lead   No orders of the defined types were placed in this encounter.   Patient Instructions  Medication Instructions:  Your physician recommends that you continue on your current medications as directed. Please refer to the Current Medication list given to you today.  *If you need a refill on your cardiac medications before your next appointment, please call your pharmacy*  Follow-Up: At [facility], you and your health needs are our priority.  As part of our continuing mission to provide you with exceptional heart care, we have created designated Provider Care Teams.  These Care Teams include your primary Cardiologist (physician) and Advanced Practice Providers (APPs -  Physician Assistants and Nurse Practitioners) who all work together to provide you with the care you need, when you need it.  Your next appointment:   6 month(s)  The format for your next appointment:   In Person  Provider:   Rudean Haskell, Casey [name]  Important Information About Sugar         Signed, Casey [name], Casey [name]  11/13/2022 10:43 AM    [place]

## 2022-11-13 ENCOUNTER — Encounter: Payer: Self-pay | Admitting: Interventional Cardiology

## 2022-11-13 ENCOUNTER — Ambulatory Visit: Payer: Medicare Other | Attending: Interventional Cardiology | Admitting: Interventional Cardiology

## 2022-11-13 VITALS — BP 136/70 | HR 61 | Ht 70.0 in | Wt 210.8 lb

## 2022-11-13 DIAGNOSIS — E785 Hyperlipidemia, unspecified: Secondary | ICD-10-CM | POA: Insufficient documentation

## 2022-11-13 DIAGNOSIS — I77819 Aortic ectasia, unspecified site: Secondary | ICD-10-CM | POA: Insufficient documentation

## 2022-11-13 DIAGNOSIS — G4733 Obstructive sleep apnea (adult) (pediatric): Secondary | ICD-10-CM | POA: Insufficient documentation

## 2022-11-13 DIAGNOSIS — I5022 Chronic systolic (congestive) heart failure: Secondary | ICD-10-CM | POA: Insufficient documentation

## 2022-11-13 DIAGNOSIS — I2510 Atherosclerotic heart disease of native coronary artery without angina pectoris: Secondary | ICD-10-CM | POA: Insufficient documentation

## 2022-11-13 DIAGNOSIS — I4819 Other persistent atrial fibrillation: Secondary | ICD-10-CM | POA: Diagnosis not present

## 2022-11-13 NOTE — Patient Instructions (Signed)
Medication Instructions:  Your physician recommends that you continue on your current medications as directed. Please refer to the Current Medication list given to you today.  *If you need a refill on your cardiac medications before your next appointment, please call your pharmacy*  Follow-Up: At [facility], you and your health needs are our priority.  As part of our continuing mission to provide you with exceptional heart care, we have created designated Provider Care Teams.  These Care Teams include your primary Cardiologist (physician) and Advanced Practice Providers (APPs -  Physician Assistants and Nurse Practitioners) who all work together to provide you with the care you need, when you need it.  Your next appointment:   6 month(s)  The format for your next appointment:   In Person  Provider:   Rudean Haskell, MD  Important Information About Sugar

## 2022-11-26 DIAGNOSIS — U071 COVID-19: Secondary | ICD-10-CM | POA: Diagnosis not present

## 2022-11-26 DIAGNOSIS — J019 Acute sinusitis, unspecified: Secondary | ICD-10-CM | POA: Diagnosis not present

## 2022-11-26 DIAGNOSIS — R0981 Nasal congestion: Secondary | ICD-10-CM | POA: Diagnosis not present

## 2022-11-26 DIAGNOSIS — R059 Cough, unspecified: Secondary | ICD-10-CM | POA: Diagnosis not present

## 2022-11-27 ENCOUNTER — Telehealth: Payer: Self-pay | Admitting: Interventional Cardiology

## 2022-11-27 DIAGNOSIS — I4819 Other persistent atrial fibrillation: Secondary | ICD-10-CM

## 2022-11-27 NOTE — Telephone Encounter (Signed)
Pt calling back about which medications are covered by his insurance and which ones aren't

## 2022-11-27 NOTE — Telephone Encounter (Signed)
He has [employer] which should cover DOACs, I have never heard of an insurance refusing to cover any DOAC and only warfarin since DOACs are more effective and safer than warfarin.  Is it just a donut hole issue? His plan will reset on January 1 and his branded medications will go back to their normal copay (usually ~$45/month for his Entresto and Eliquis). If this is the case, would sample him with Eliquis for 1 week until his copay is affordable again next week.

## 2022-11-27 NOTE — Telephone Encounter (Signed)
**Note De-Identified Casey [name] Obfuscation** The pt states that his insurance plan will not cover Eliquis any longer and that he s/w Dr Tamala Julian at his last office visit with him about an alternative.  He states that he cannot remember one of the names of the 2 anticoagulants that Dr Tamala Julian recommended to replace Eliquis. He remembers Xarelto and states that the other one started with the letter P. I advised him that most likely the other medication is Pradaxa.  He states that he is calling his plan now to ask if either of these anticoagulants are covered and will let us know. I did advise him to ask his plan which anticoagulant they will cover if these 2 are no and to call us back so we can discuss.  He verbalized understanding and thanked me for calling him back.   FYI: The pt states that his income exceeds [employer] income limit so he is not eligible for pt assistance for Eliquis.

## 2022-11-27 NOTE — Telephone Encounter (Signed)
**Note De-Identified Casey [name] Obfuscation** The pt states that he Ins plan advised him that they only cover generic Warfarin and none of the other anticoagulants.  He states that he is willing to switch to Warfarin only if Dr Tamala Julian thinks it is okay for him to do so as he only recommended Xarelto and Pradaxa at his last office visit.  He also states that if Dr Tamala Julian does not recommend him taking Warfarin he cannot afford any of the others as they are name brand.  He is aware that I am forwarding this message to Dr Tamala Julian and our pharmacist and that someone from the office will be calling him back with recommendations.

## 2022-11-27 NOTE — Telephone Encounter (Signed)
Pt c/o medication issue:  1. Name of Medication: ELIQUIS 5 MG TABS tablet   2. How are you currently taking this medication (dosage and times per day)? As prescribed   3. Are you having a reaction (difficulty breathing--STAT)? No  4. What is your medication issue? Pt states that insurance will not cover this for much longer and patient would like a call back to discuss other options. Please advise.

## 2022-11-27 NOTE — Telephone Encounter (Signed)
**Note De-Identified Oluwatosin Bracy Obfuscation** The pt states that he just got [facility] on the 1st of November this year and that he has called "the girl" several times since he signed up with this plan because all of his name brand meds have gone up in cost.  I advised him to contact "the girl" or [employer] to get a better understanding of the plan he has.  He states that he cannot afford the $800 that they are charging him now for his Eliquis so he wants to switch Warfairin.   He is aware that someone will be calling him back to discuss.

## 2022-11-28 NOTE — Telephone Encounter (Signed)
Spoke to [place], who stated it is okay to switch pt to warfarin and pt can start on warfarin '5mg'$  daily, 3 day overlap with Eliquis.   Called and spoke to pt who stated that he has about a month left of Eliquis. Informed pt to let us know when he has 1 week left of Eliquis.   Informed pt that warfarin is another blood thinner that is taken once a day. Informed pt that he will need to come in for blood work (inr monitoring) to make sure that he is one the correct dose of warfarin. Informed pt that he will need a 3 day warfarin overlap with Eliquis. Informed pt that the blood work will be weekly for the first 4 weeks and then he could possibly build up to go 6 weeks in between getting INR checked. Pt verbalized understanding and gave him the number to the coumadin clinic to call when he has 1 week left of Eliquis.  Coumadin Clinic number [phone].

## 2022-12-02 DIAGNOSIS — D649 Anemia, unspecified: Secondary | ICD-10-CM | POA: Diagnosis not present

## 2022-12-18 MED ORDER — WARFARIN SODIUM 5 MG PO TABS: ORAL_TABLET | ORAL | 0 refills | Status: DC

## 2022-12-18 NOTE — Telephone Encounter (Addendum)
Spoke with pt since he left a message that he has 6 days left on his eliquis. Advised that he will complete a 3 day overlap on 12/22/22, 12/23/22, 12/24/22 with eliquis '5mg'$  twice a day and warfarin '5mg'$  daily, then on day 4 (12/25/22) he will start warfarin '5mg'$  daily only. He was advised to take the warfarin dose in the evening and he verbalized understanding.  He is aware I will send in warfarin '5mg'$  tablet to his selected pharmacy.  Also, set an appt in [place] office since it is 30 minutes from his home in [place]. 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 Pt was given the address to the location and verbalized understanding.

## 2022-12-19 DIAGNOSIS — Z03818 Encounter for observation for suspected exposure to other biological agents ruled out: Secondary | ICD-10-CM | POA: Diagnosis not present

## 2022-12-19 DIAGNOSIS — J101 Influenza due to other identified influenza virus with other respiratory manifestations: Secondary | ICD-10-CM | POA: Diagnosis not present

## 2022-12-19 DIAGNOSIS — R0981 Nasal congestion: Secondary | ICD-10-CM | POA: Diagnosis not present

## 2022-12-19 DIAGNOSIS — R051 Acute cough: Secondary | ICD-10-CM | POA: Diagnosis not present

## 2022-12-30 ENCOUNTER — Ambulatory Visit: Payer: Medicare Other | Attending: Cardiology | Admitting: *Deleted

## 2022-12-30 DIAGNOSIS — I4819 Other persistent atrial fibrillation: Secondary | ICD-10-CM

## 2022-12-30 DIAGNOSIS — Z79899 Other long term (current) drug therapy: Secondary | ICD-10-CM | POA: Insufficient documentation

## 2022-12-30 DIAGNOSIS — Z5181 Encounter for therapeutic drug level monitoring: Secondary | ICD-10-CM | POA: Diagnosis not present

## 2022-12-30 LAB — POCT INR: INR: 3.9 — AB (ref 2.0–3.0)

## 2022-12-30 NOTE — Patient Instructions (Signed)
Hold warfarin tonight then decrease dose to 1 tablet daily except 1/2 tablet on Mondays, Wednesdays and Fridays. Recheck in 1 wk

## 2023-01-01 DIAGNOSIS — R519 Headache, unspecified: Secondary | ICD-10-CM | POA: Diagnosis not present

## 2023-01-01 DIAGNOSIS — M961 Postlaminectomy syndrome, not elsewhere classified: Secondary | ICD-10-CM | POA: Diagnosis not present

## 2023-01-01 DIAGNOSIS — Z6830 Body mass index (BMI) 30.0-30.9, adult: Secondary | ICD-10-CM | POA: Diagnosis not present

## 2023-01-06 ENCOUNTER — Ambulatory Visit: Payer: Medicare Other | Attending: Cardiology | Admitting: *Deleted

## 2023-01-06 DIAGNOSIS — Z5181 Encounter for therapeutic drug level monitoring: Secondary | ICD-10-CM

## 2023-01-06 DIAGNOSIS — I4819 Other persistent atrial fibrillation: Secondary | ICD-10-CM

## 2023-01-06 LAB — POCT INR: INR: 3 (ref 2.0–3.0)

## 2023-01-06 NOTE — Patient Instructions (Signed)
Decrease warfarin to 1/2 tablet daily except 1 tablet on Sundays, Tuesdays and Thursdays. Recheck in 2 wks

## 2023-01-08 ENCOUNTER — Encounter (HOSPITAL_COMMUNITY): Payer: Self-pay | Admitting: *Deleted

## 2023-01-12 ENCOUNTER — Other Ambulatory Visit: Payer: Self-pay

## 2023-01-12 DIAGNOSIS — I4819 Other persistent atrial fibrillation: Secondary | ICD-10-CM

## 2023-01-12 MED ORDER — WARFARIN SODIUM 5 MG PO TABS: ORAL_TABLET | ORAL | 0 refills | Status: DC

## 2023-01-12 NOTE — Telephone Encounter (Signed)
Prescription refill request received for warfarin Lov: 11/13/22 Casey [name])  Next INR check: 01/20/23 Warfarin tablet strength: 34m  Appropriate dose. Refill sent.

## 2023-01-19 ENCOUNTER — Other Ambulatory Visit: Payer: Self-pay

## 2023-01-19 DIAGNOSIS — I10 Essential (primary) hypertension: Secondary | ICD-10-CM | POA: Diagnosis not present

## 2023-01-19 DIAGNOSIS — I77819 Aortic ectasia, unspecified site: Secondary | ICD-10-CM | POA: Diagnosis not present

## 2023-01-19 DIAGNOSIS — K279 Peptic ulcer, site unspecified, unspecified as acute or chronic, without hemorrhage or perforation: Secondary | ICD-10-CM | POA: Diagnosis not present

## 2023-01-19 DIAGNOSIS — D509 Iron deficiency anemia, unspecified: Secondary | ICD-10-CM | POA: Diagnosis not present

## 2023-01-19 DIAGNOSIS — I5022 Chronic systolic (congestive) heart failure: Secondary | ICD-10-CM | POA: Diagnosis not present

## 2023-01-19 DIAGNOSIS — E7800 Pure hypercholesterolemia, unspecified: Secondary | ICD-10-CM | POA: Diagnosis not present

## 2023-01-19 DIAGNOSIS — I700 Atherosclerosis of aorta: Secondary | ICD-10-CM | POA: Diagnosis not present

## 2023-01-19 DIAGNOSIS — D62 Acute posthemorrhagic anemia: Secondary | ICD-10-CM | POA: Diagnosis not present

## 2023-01-19 DIAGNOSIS — I4891 Unspecified atrial fibrillation: Secondary | ICD-10-CM | POA: Diagnosis not present

## 2023-01-19 DIAGNOSIS — J0191 Acute recurrent sinusitis, unspecified: Secondary | ICD-10-CM | POA: Diagnosis not present

## 2023-01-19 DIAGNOSIS — D6869 Other thrombophilia: Secondary | ICD-10-CM | POA: Diagnosis not present

## 2023-01-19 DIAGNOSIS — E1159 Type 2 diabetes mellitus with other circulatory complications: Secondary | ICD-10-CM | POA: Diagnosis not present

## 2023-01-19 MED ORDER — SPIRONOLACTONE 25 MG PO TABS: 12.5000 mg | ORAL_TABLET | Freq: Every day | ORAL | 1 refills | Status: DC

## 2023-01-20 ENCOUNTER — Ambulatory Visit: Payer: Medicare Other | Attending: Cardiology | Admitting: *Deleted

## 2023-01-20 DIAGNOSIS — I4819 Other persistent atrial fibrillation: Secondary | ICD-10-CM

## 2023-01-20 DIAGNOSIS — Z5181 Encounter for therapeutic drug level monitoring: Secondary | ICD-10-CM

## 2023-01-20 LAB — POCT INR: INR: 2.1 (ref 2.0–3.0)

## 2023-01-20 NOTE — Patient Instructions (Signed)
Continue 1/2 tablet daily except 1 tablet on Sundays, Tuesdays and Thursdays. Recheck in 2 wks

## 2023-01-28 ENCOUNTER — Other Ambulatory Visit: Payer: Self-pay

## 2023-01-28 MED ORDER — CARVEDILOL 6.25 MG PO TABS: 6.2500 mg | ORAL_TABLET | Freq: Two times a day (BID) | ORAL | 1 refills | Status: DC

## 2023-02-04 ENCOUNTER — Ambulatory Visit: Payer: Medicare Other | Attending: Cardiology | Admitting: *Deleted

## 2023-02-04 DIAGNOSIS — I4819 Other persistent atrial fibrillation: Secondary | ICD-10-CM | POA: Insufficient documentation

## 2023-02-04 DIAGNOSIS — Z5181 Encounter for therapeutic drug level monitoring: Secondary | ICD-10-CM | POA: Insufficient documentation

## 2023-02-04 LAB — POCT INR: INR: 1.7 — AB (ref 2.0–3.0)

## 2023-02-04 NOTE — Patient Instructions (Signed)
Increase warfarin to 1 tablet daily except 1/2 tablet on Mondays and Fridays. Recheck in 2 wks

## 2023-02-10 DIAGNOSIS — R490 Dysphonia: Secondary | ICD-10-CM | POA: Diagnosis not present

## 2023-02-10 DIAGNOSIS — J329 Chronic sinusitis, unspecified: Secondary | ICD-10-CM | POA: Diagnosis not present

## 2023-02-17 ENCOUNTER — Ambulatory Visit: Payer: Medicare Other | Attending: Cardiology | Admitting: *Deleted

## 2023-02-17 DIAGNOSIS — Z5181 Encounter for therapeutic drug level monitoring: Secondary | ICD-10-CM | POA: Insufficient documentation

## 2023-02-17 DIAGNOSIS — I4819 Other persistent atrial fibrillation: Secondary | ICD-10-CM | POA: Insufficient documentation

## 2023-02-17 LAB — POCT INR: INR: 2.2 (ref 2.0–3.0)

## 2023-02-17 NOTE — Patient Instructions (Signed)
Continue warfarin 1 tablet daily except 1/2 tablet on Mondays and Fridays. Recheck in 3 wks

## 2023-03-10 ENCOUNTER — Ambulatory Visit: Payer: Medicare Other | Attending: Cardiology | Admitting: *Deleted

## 2023-03-10 DIAGNOSIS — I4819 Other persistent atrial fibrillation: Secondary | ICD-10-CM | POA: Insufficient documentation

## 2023-03-10 DIAGNOSIS — Z5181 Encounter for therapeutic drug level monitoring: Secondary | ICD-10-CM | POA: Insufficient documentation

## 2023-03-10 LAB — POCT INR: INR: 2.7 (ref 2.0–3.0)

## 2023-03-10 NOTE — Patient Instructions (Addendum)
Continue warfarin 1 tablet daily except 1/2 tablet on Mondays and Fridays. Recheck in 4 wks

## 2023-03-16 ENCOUNTER — Other Ambulatory Visit: Payer: Self-pay | Admitting: Neurosurgery

## 2023-03-16 DIAGNOSIS — M961 Postlaminectomy syndrome, not elsewhere classified: Secondary | ICD-10-CM

## 2023-03-23 DIAGNOSIS — H903 Sensorineural hearing loss, bilateral: Secondary | ICD-10-CM | POA: Diagnosis not present

## 2023-03-24 ENCOUNTER — Encounter: Payer: Self-pay | Admitting: *Deleted

## 2023-04-03 DIAGNOSIS — M19011 Primary osteoarthritis, right shoulder: Secondary | ICD-10-CM | POA: Diagnosis not present

## 2023-04-03 DIAGNOSIS — M25511 Pain in right shoulder: Secondary | ICD-10-CM | POA: Diagnosis not present

## 2023-04-06 ENCOUNTER — Ambulatory Visit (HOSPITAL_COMMUNITY)
Admission: RE | Admit: 2023-04-06 | Discharge: 2023-04-06 | Disposition: A | Discharge status: Home / Self Care | Payer: Medicare Other | Source: Ambulatory Visit | Attending: Physician Assistant | Admitting: Physician Assistant

## 2023-04-06 VITALS — BP 156/82 | HR 51 | Ht 70.0 in | Wt 218.6 lb

## 2023-04-06 DIAGNOSIS — I10 Essential (primary) hypertension: Secondary | ICD-10-CM | POA: Diagnosis not present

## 2023-04-06 DIAGNOSIS — E119 Type 2 diabetes mellitus without complications: Secondary | ICD-10-CM | POA: Insufficient documentation

## 2023-04-06 DIAGNOSIS — Z87891 Personal history of nicotine dependence: Secondary | ICD-10-CM | POA: Diagnosis not present

## 2023-04-06 DIAGNOSIS — Z79899 Other long term (current) drug therapy: Secondary | ICD-10-CM

## 2023-04-06 DIAGNOSIS — Z7901 Long term (current) use of anticoagulants: Secondary | ICD-10-CM | POA: Diagnosis not present

## 2023-04-06 DIAGNOSIS — Z5181 Encounter for therapeutic drug level monitoring: Secondary | ICD-10-CM

## 2023-04-06 DIAGNOSIS — I2510 Atherosclerotic heart disease of native coronary artery without angina pectoris: Secondary | ICD-10-CM | POA: Insufficient documentation

## 2023-04-06 DIAGNOSIS — D6869 Other thrombophilia: Secondary | ICD-10-CM | POA: Diagnosis not present

## 2023-04-06 DIAGNOSIS — G4733 Obstructive sleep apnea (adult) (pediatric): Secondary | ICD-10-CM | POA: Diagnosis not present

## 2023-04-06 DIAGNOSIS — Z7984 Long term (current) use of oral hypoglycemic drugs: Secondary | ICD-10-CM | POA: Diagnosis not present

## 2023-04-06 DIAGNOSIS — I4819 Other persistent atrial fibrillation: Secondary | ICD-10-CM

## 2023-04-06 DIAGNOSIS — Z9884 Bariatric surgery status: Secondary | ICD-10-CM | POA: Diagnosis not present

## 2023-04-06 DIAGNOSIS — Z833 Family history of diabetes mellitus: Secondary | ICD-10-CM | POA: Diagnosis not present

## 2023-04-06 LAB — BASIC METABOLIC PANEL
Anion gap: 9 (ref 5–15)
BUN: 32 mg/dL — ABNORMAL HIGH (ref 8–23)
CO2: 25 mmol/L (ref 22–32)
Calcium: 9.5 mg/dL (ref 8.9–10.3)
Chloride: 104 mmol/L (ref 98–111)
Creatinine, Ser: 1.15 mg/dL (ref 0.61–1.24)
GFR, Estimated: >60 mL/min (ref >60)
Glucose, Bld: 96 mg/dL (ref 70–99)
Potassium: 4.4 mmol/L (ref 3.5–5.1)
Sodium: 138 mmol/L (ref 135–145)

## 2023-04-06 LAB — MAGNESIUM: Magnesium: 1.9 mg/dL (ref 1.7–2.4)

## 2023-04-06 MED ORDER — SPIRONOLACTONE 25 MG PO TABS: 25.0000 mg | ORAL_TABLET | Freq: Every day | ORAL | 1 refills | Status: DC

## 2023-04-06 MED ORDER — DOFETILIDE 500 MCG PO CAPS: 500.0000 ug | ORAL_CAPSULE | Freq: Two times a day (BID) | ORAL | 2 refills | Status: DC

## 2023-04-06 NOTE — Progress Notes (Signed)
Primary Care Physician: Joycelyn Rua, MD Referring Physician: Dr. Armanda Magic   Casey [name]. is a 67 y.o. male with a h/o obesity, s/p gastric sleeve, HTN, DM that presented for pre op for rt parotidectomy 8/8 and was found to be in rate controlled afib, from which he was asymptomatic. He was sent to the afib clinic for evaluation. 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 He has felt some fatigue, less stamina for several months. Was just seen by PCP yesterday and nothing out of the ordinary was noted with his heart rhythm. He denies any exertional chest pain.   He does not drink alcohol, no tobacco use,no excessive caffeine. He has kept his weight stable for several years. Was almost 100 lbs heavier before gastric sleeve.  I discussed with Dr. Johney Frame and he recommended an echo and if ok, he would be considered at low risk for surgery and could start anticoagulation after surgery. However, pt is now back in the office as his echo did show a reduced EF at 30-35%. Dr. Johney Frame discussed with Dr. Jenne Pane and it is felt most appropriate  to delay surgery in order to obtain  LHC to further determine his risk for surgery. He does have cardiac risk factors for CAD , ie , obesity,  HTN, DM. He denies any exertional chest pain but c/o fatigue/ low stamina  for several months/early summer. LHC did show 3 vessel  moderate  CAD  F/u in afib clinic, he is now been on anticoagulation x 2 weeks without interruption and can now schedule cardioversion after another week, he is in agreement.  F/u in afib clinic,9/20, he unfortunately had unsuccessful cardioversion and is back in the clinic to discuss antiarrythmic's.It was decided that he would come into the hospital for Tikosyn after he checked on the price of the drug.  F/u in afib clinic, 10/1, for admission for Tikosyn.  He will get the drug free for the rest  of the year as he had met his out of pocket deductible.   F/u in afib clinic 10/11. He is staying in SR on [place]. He feels improved. His reduced EF meds have been titrated by Dr. Katrinka Blazing.  F/u in afib clinic,01/07/19. He is in SR and has been doing well staying in rhythm on dofetilide. He ultimately had his parotid gland successfully removed and did not have any issues with the surgery, he was benign. He had a sleep study and it was positive for OSA. He is suppose to pick up his equipment soon.He has had an echo since restoring SR and has shown normalization of EF.  F/u in afib clinic 10/22, he is staying in SR with tikosyn. He has had neck surgeries and seeing some improvement in his pain.   F/u in afib clinic, 02/01/20. He continues to stay in SR with Tikosyn. Continues on eliquis with a CHA2DS2VASc of 2. Had a back injection last week and came off eliquis x 3 days. He is anticipating  a surgery for a rod to be placed in his cervical spine in the near future. Has had both covid shots.  Here in afib clinic 08/02/20,  for dofetilide screening. He reports that he  is doing well without any afib burden. EKG shows SR with stable qt. He also reports have renal CA and had Rt partial nephrectomy in May. It was felt the surgery  was successful and he did not have to have any f/u radiation or  Chemo. He remained in SR during the surgery. He has lost around 39 lbs form the surgery, mostly as he did not have much appetite for several weeks following surgery.   F/u in afib clinic, 01/30/21. He has not noted any afib. Remains on tikosyn. He feels well. No changes in health. 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 No issues  with eliquis for a CHA2DS2VASc score of 2.   F/u afib clinic, 08/07/21. He has not noted any afib. Compliant with Tikosyn and anticoagulation. Passed a kidney stone a while back.   F/u in afib clinic, 02/05/22. He is doing well on  tikosyn. No afib noted. Qtc stable. States will need a hernia repair in May.   F/u in afib clinic for Tikosyn surveillance, 10/07/22. He is staying in SR. 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 Qt is stable. Being compliant with tikosyn and eliquis.   F/u in afib clinic for Tikosyn surveillance, 04/06/23. He is in SR today. He is doing well overall and has not had any Afib since November. His main concern today is elevated systolic BP, essentially since November when his coreg was lowered from 12.5 BID to 6.25 mg BID. No missed doses of Tikosyn or Eliquis.  Today, he denies symptoms of palpitations, chest pain, shortness of breath, orthopnea, PND, lower extremity edema, dizziness, presyncope, syncope, or neurologic sequela.  The patient is tolerating medications without difficulties and is otherwise without complaint today.   Past Medical History:  Diagnosis Date   Arthritis    Atrial fibrillation (HCC) 07/2018   Cancer (HCC)    skin Ca on nose,kidney   Chronic kidney disease    Complication of anesthesia    diff. waking up   Diabetes mellitus    Dysrhythmia 07/2018   A-Fib   Headache(784.0)    Hypertension    dr Manus Gunning   pcp   Sleep apnea    prior to weight lose surgery-Dr. Henrietta Hoover not use CPAP   Past Surgical History:  Procedure Laterality Date   APPENDECTOMY     BACK SURGERY     5  back surgeries   CARDIAC CATHETERIZATION     CARDIOVERSION N/A 08/13/2018   Procedure: CARDIOVERSION;  Surgeon: Laurey Morale, MD;  Location[facility] ENDOSCOPY;  Service: Cardiovascular;  Laterality: N/A;   CARDIOVERSION N/A 09/02/2018   Procedure: CARDIOVERSION;  Surgeon: Vesta Mixer, MD;  Location: [facility] ENDOSCOPY;  Service: Cardiovascular;  Laterality: N/A;   CARPAL TUNNEL RELEASE     bil   COLON SURGERY     2004 for colon rupture   HERNIA REPAIR     LAPAROSCOPIC GASTRIC BANDING     LEFT HEART CATH AND CORONARY ANGIOGRAPHY  N/A 07/21/2018   Procedure: LEFT HEART CATH AND CORONARY ANGIOGRAPHY;  Surgeon: Lyn Records, MD;  Location: [facility] INVASIVE CV LAB;  Service: Cardiovascular;  Laterality: N/A;   MANDIBLE FRACTURE SURGERY     x 2   PAROTIDECTOMY Right 11/19/2018   Procedure: PAROTIDECTOMY;  Surgeon: Christia Reading, MD;  Location: [facility] OR;  Service: ENT;  Laterality: Right;   ROBOTIC ASSITED PARTIAL NEPHRECTOMY Right 04/04/2020   Procedure: XI ROBOTIC ASSITED PARTIAL NEPHRECTOMYINTRAOPATIVE ULTRASOUND;  Surgeon: Sebastian Ache, MD;  Location: [facility] ORS;  Service: Urology;  Laterality: Right;  3.5 HRS   TONSILLECTOMY      Current Outpatient Medications  Medication Sig Dispense Refill   atorvastatin (LIPITOR) 10 MG tablet Take 10 mg by mouth at bedtime.   1   B-D UF III MINI PEN NEEDLES 31G X 5 MM MISC USE WITH VICTOZA AS DIRECTED ONCE A DAY     carvedilol (COREG) 6.25 MG tablet Take 1 tablet (6.25 mg total) by mouth 2 (two) times daily with a meal. 180 tablet 1   Docusate Sodium (DSS) 100 MG CAPS Take 2 capsules by mouth as needed.     dofetilide (TIKOSYN) 500 MCG capsule TAKE 1 CAPSULE BY MOUTH TWICE A DAY 180 capsule 2   doxycycline (VIBRA-TABS) 100 MG tablet Take 100 mg by mouth 2 (two) times daily.     ENTRESTO 49-51 MG TAKE 1 TABLET BY MOUTH TWICE A DAY 180 tablet 3   ferrous sulfate 325 (65 FE) MG tablet 325 mg 2 (two) times daily with a meal.     glimepiride (AMARYL) 2 MG tablet Take 2 mg by mouth daily with breakfast.     glucose blood test strip USE TO TEST BLOOD SUGAR THREE TIMES A DAY     HYDROcodone-acetaminophen (NORCO) 10-325 MG tablet Take 1 tablet by mouth every 4 (four) hours as needed (for severe back pain.).     magnesium gluconate (MAGONATE) 500 MG tablet Take 500 mg by mouth 2 (two) times daily.     metFORMIN (GLUCOPHAGE-XR) 500 MG 24 hr tablet Take 1,000 mg by mouth 2 (two) times daily.  1   Multiple Vitamin (ONE DAILY MULTIVITAMIN ADULT PO) Take 1 tablet by mouth daily.     Multiple  Vitamins-Minerals (PRESERVISION AREDS PO) Take 1 capsule by mouth 2 (two) times daily.     pantoprazole (PROTONIX) 40 MG tablet Take 40 mg by mouth every morning.     spironolactone (ALDACTONE) 25 MG tablet Take 0.5 tablets (12.5 mg total) by mouth daily. 45 tablet 1   tamsulosin (FLOMAX) 0.4 MG CAPS capsule Take 0.4 mg by mouth daily.     topiramate (TOPAMAX) 25 MG tablet Take 25 mg by mouth 2 (two) times daily.      warfarin (COUMADIN) 5 MG tablet Take 1/2 tablet to 1 tablet by mouth daily as directed by Anticoagulation Clinic. 65 tablet 0   No current facility-administered medications for this visit.    Allergies  Allergen Reactions   Bifidobacterium Other (See Comments)    Other Reaction(s): Abdominal Pain, GI Upset (intolerance), stomach upset   Penicillins Hives    Has patient had a PCN reaction causing immediate rash, facial/tongue/throat swelling, SOB or lightheadedness with hypotension: Unknown Has patient had a PCN reaction causing severe rash involving mucus membranes or skin necrosis: Unknown Has patient had a PCN reaction that required hospitalization: No Has patient had a PCN reaction occurring within the last 10 years: No Childhood reaction. If all of the above answers are "NO", then may proceed with Cephalosporin use.    Percocet [Oxycodone-Acetaminophen] Anxiety and Other (See Comments)    Hyperactivity & unhinged. Previously tolerated hydrocodone   Ace Inhibitors Other (See Comments)   Align Prebiotic-Probiotic Other (See Comments)   Gabapentin Other (See Comments)   Lansoprazole Other (See Comments)   Lopressor [Metoprolol] Other (See Comments)    Drops heart rate- caused Ed visit   Rofecoxib Other (See Comments)   Simvastatin Other (See Comments)   Sulfamethoxazole-Trimethoprim Other (See Comments)   Zoloft [Sertraline] Other (See Comments)  Codeine Anxiety   Oxycodone Anxiety    Social History   Socioeconomic History   Marital status: Married    Spouse  name: Not on file   Number of children: Not on file   Years of education: Not on file   Highest education level: Not on file  Occupational History   Not on file  Tobacco Use   Smoking status: Former    Packs/day: 0.90    Years: 15.00    Additional pack years: 0.00    Total pack years: 13.50    Types: Cigarettes    Quit date: 01/19/2006    Years since quitting: 17.2   Smokeless tobacco: Never  Vaping Use   Vaping Use: Never used  Substance and Sexual Activity   Alcohol use: No   Drug use: No   Sexual activity: Not on file  Other Topics Concern   Not on file  Social History Narrative   Not on file   Social Determinants of Health   Financial Resource Strain: Not on file  Food Insecurity: Not on file  Transportation Needs: Not on file  Physical Activity: Not on file  Stress: Not on file  Social Connections: Not on file  Intimate Partner Violence: Not on file    Family History  Problem Relation Age of Onset   Diabetes Maternal Grandfather    Heart disease Neg Hx     ROS- All systems are reviewed and negative except as per the HPI above  Physical Exam: There were no vitals filed for this visit.  Wt Readings from Last 3 Encounters:  11/13/22 95.6 kg  10/07/22 96.9 kg  02/05/22 99.5 kg    Labs: Lab Results  Component Value Date   NA 140 10/07/2022   K 5.0 10/07/2022   CL 109 10/07/2022   CO2 23 10/07/2022   GLUCOSE 81 10/07/2022   BUN 21 10/07/2022   CREATININE 1.07 10/07/2022   CALCIUM 9.6 10/07/2022   MG 1.9 10/07/2022   Lab Results  Component Value Date   INR 2.7 03/10/2023   Lab Results  Component Value Date   CHOL 141 01/29/2022   HDL 49 01/29/2022   LDLCALC 78 01/29/2022   TRIG 71 01/29/2022    GEN- The patient is well appearing, alert and oriented x 3 today.   Head- normocephalic, atraumatic Eyes-  Sclera clear, conjunctiva pink Ears- hearing intact Oropharynx- clear Neck- supple, no JVP Lymph- no cervical lymphadenopathy Lungs-  Clear to ausculation bilaterally, normal work of breathing Heart- Regular rate and rhythm, no murmurs, rubs or gallops, PMI not laterally displaced GI- soft, NT, ND, + BS Extremities- no clubbing, cyanosis, or edema MS- no significant deformity or atrophy Skin- no rash or lesion Psych- euthymic mood, full affect Neuro- strength and sensation are intact    EKG-Vent. rate 51 BPM PR interval 162 ms QRS duration 100 ms QT/QTcB 402/370 ms P-R-T axes 68 -8 2 Sinus bradycardia Cannot rule out Anterior infarct , age undetermined Abnormal ECG When compared with ECG of 07-Oct-2022 10:45, PREVIOUS ECG IS PRESENT  Epic records reviewed Echo 03/07/21-Study Conclusions   1. Left ventricular ejection fraction, by estimation, is 60 to 65%. The  left ventricle has normal function. The left ventricle has no regional  wall motion abnormalities. Left ventricular diastolic parameters are  consistent with Grade I diastolic  dysfunction (impaired relaxation). Elevated left ventricular end-diastolic  pressure.   2. Right ventricular systolic function is normal. The right ventricular  size is normal.  3. The mitral valve is normal in structure. Mild mitral valve  regurgitation. No evidence of mitral stenosis.   4. The aortic valve is calcified. There is mild calcification of the  aortic valve. There is mild thickening of the aortic valve. Aortic valve  regurgitation is moderate. No aortic stenosis is present.   5. Aortic dilatation noted. There is moderate dilatation of the ascending  aorta, measuring 46 mm.   6. The inferior vena cava is normal in size with greater than 50%  respiratory variability, suggesting right atrial pressure of 3 mmHg.    LHC- 8/21-Moderate three-vessel coronary artery disease. 60 to 70% mid RCA and 95% stenosis of the distal right coronary beyond the origin of the PDA. Normal left main Mid LAD 60 to 70% stenosis beyond the origin of the dominant first diagonal. Large  ramus intermedius with luminal irregularity. Relatively small distribution circumflex with 70% obstruction in the first marginal and total occlusion of the distal circumflex before the origin of the small second obtuse marginal.  There are faint left to left collaterals to the distal circumflex. Moderately severe left ventricular dysfunction with global hypokinesis, EF 35%.  Normal LVEDP.      Assessment and Plan: 1. Persistent  Afib  Staying in normal rhythm with dofetilide Continue drug at 500 mcg bid Qtc stable 360 ms Bmet/mag Continue  OSA, on bipap  2.CHA2DS2VASc score of 4 (HTN,CAD, age, DM)  Continue eliquis 5 mg bid; no missed doses  3. CAD / LV dysfunction EF has normalized with restoring SR No anginal symptoms  Continue current meds   4. HTN Increase spironolactone to 25 mg tablet daily, recheck Bmet in 2 weeks. Check BP at home.  F/u here in 6 months for tikosyn surveillance    Lake Bells, PA-C [facility] [address] [place], [place] [address] [phone]

## 2023-04-06 NOTE — Patient Instructions (Signed)
Increase spironolactone to 25mg  once a day

## 2023-04-06 NOTE — Addendum Note (Signed)
Encounter addended by: Learta Codding, CMA on: 04/06/2023 1:55 PM  Actions taken: Order list changed

## 2023-04-08 DIAGNOSIS — D62 Acute posthemorrhagic anemia: Secondary | ICD-10-CM | POA: Diagnosis not present

## 2023-04-08 DIAGNOSIS — D509 Iron deficiency anemia, unspecified: Secondary | ICD-10-CM | POA: Diagnosis not present

## 2023-04-09 ENCOUNTER — Ambulatory Visit: Payer: Medicare Other | Attending: Cardiology | Admitting: *Deleted

## 2023-04-09 DIAGNOSIS — Z5181 Encounter for therapeutic drug level monitoring: Secondary | ICD-10-CM | POA: Insufficient documentation

## 2023-04-09 DIAGNOSIS — I4819 Other persistent atrial fibrillation: Secondary | ICD-10-CM

## 2023-04-09 LAB — POCT INR: INR: 1.7 — AB (ref 2.0–3.0)

## 2023-04-09 MED ORDER — WARFARIN SODIUM 5 MG PO TABS
ORAL_TABLET | ORAL | 3 refills | Status: DC
Start: 2023-04-09 — End: 2023-12-30

## 2023-04-09 NOTE — Patient Instructions (Signed)
Take warfarin 1 1/2 tablets tonight, take 1 tablet tomorrow night then resume 1 tablet daily except 1/2 tablet on Mondays and Fridays. Recheck in 3 wks

## 2023-04-11 ENCOUNTER — Ambulatory Visit
Admission: RE | Admit: 2023-04-11 | Discharge: 2023-04-11 | Disposition: A | Discharge status: Home / Self Care | Payer: Medicare Other | Source: Ambulatory Visit | Attending: Neurosurgery | Admitting: Neurosurgery

## 2023-04-11 DIAGNOSIS — M961 Postlaminectomy syndrome, not elsewhere classified: Secondary | ICD-10-CM

## 2023-04-11 DIAGNOSIS — M79606 Pain in leg, unspecified: Secondary | ICD-10-CM | POA: Diagnosis not present

## 2023-04-29 ENCOUNTER — Ambulatory Visit: Payer: Medicare Other | Attending: Cardiology | Admitting: *Deleted

## 2023-04-29 DIAGNOSIS — I4819 Other persistent atrial fibrillation: Secondary | ICD-10-CM | POA: Insufficient documentation

## 2023-04-29 DIAGNOSIS — Z5181 Encounter for therapeutic drug level monitoring: Secondary | ICD-10-CM | POA: Diagnosis not present

## 2023-04-29 LAB — POCT INR: INR: 1.8 — AB (ref 2.0–3.0)

## 2023-04-29 NOTE — Patient Instructions (Signed)
Increase warfarin to 1 tablet daily  Recheck in 3 wks 

## 2023-05-01 DIAGNOSIS — H903 Sensorineural hearing loss, bilateral: Secondary | ICD-10-CM | POA: Diagnosis not present

## 2023-05-01 DIAGNOSIS — T161XXA Foreign body in right ear, initial encounter: Secondary | ICD-10-CM | POA: Diagnosis not present

## 2023-05-08 DIAGNOSIS — L570 Actinic keratosis: Secondary | ICD-10-CM | POA: Diagnosis not present

## 2023-05-08 DIAGNOSIS — X32XXXD Exposure to sunlight, subsequent encounter: Secondary | ICD-10-CM | POA: Diagnosis not present

## 2023-05-10 NOTE — Progress Notes (Unsigned)
Cardiology Office Note:    Date:  05/10/2023   ID:  Casey Croon., DOB Feb 04, 1956, MRN [id number]  PCP:  Joycelyn Rua, MD   [facility] Providers Cardiologist:  Lesleigh Noe, MD (Inactive) { Click to update primary MD,subspecialty MD or APP then REFRESH:1}    Referring MD: Joycelyn Rua, MD   CC: Transition to new cardiologist  EKG  History of Present Illness:    Casey D Saber Petricca. is a 67 y.o. male with a hx of Morbid obesity s/p gastric sleeve, HTN with DM, and persistent AF.  2020: Dr. Already HFrEF 30%; with moderate non obstructive CAD. Failed DCCV 2021 control on Tikosyn. 2022: Mild aortic dilation and aotrtic atherosclerosis. 2023: Stable Qtc  Patient notes that he is doing ***.   Since last visit notes *** . There are no*** interval hospital/ED visit.    No chest pain or pressure ***.  No SOB/DOE*** and no PND/Orthopnea***.  No weight gain or leg swelling***.  No palpitations or syncope ***.  Ambulatory blood pressure ***.   Past Medical History:  Diagnosis Date   Arthritis    Atrial fibrillation (HCC) 07/2018   Cancer (HCC)    skin Ca on nose,kidney   Chronic kidney disease    Complication of anesthesia    diff. waking up   Diabetes mellitus    Dysrhythmia 07/2018   A-Fib   Headache(784.0)    Hypertension    dr Manus Gunning   pcp   Sleep apnea    prior to weight lose surgery-Dr. Henrietta Hoover not use CPAP    Past Surgical History:  Procedure Laterality Date   APPENDECTOMY     BACK SURGERY     5  back surgeries   CARDIAC CATHETERIZATION     CARDIOVERSION N/A 08/13/2018   Procedure: CARDIOVERSION;  Surgeon: Laurey Morale, MD;  Location: [facility] ENDOSCOPY;  Service: Cardiovascular;  Laterality: N/A;   CARDIOVERSION N/A 09/02/2018   Procedure: CARDIOVERSION;  Surgeon: Vesta Mixer, MD;  Location: [facility] ENDOSCOPY;  Service: Cardiovascular;  Laterality: N/A;   CARPAL TUNNEL RELEASE     bil   COLON SURGERY     2004 for colon  rupture   HERNIA REPAIR     LAPAROSCOPIC GASTRIC BANDING     LEFT HEART CATH AND CORONARY ANGIOGRAPHY N/A 07/21/2018   Procedure: LEFT HEART CATH AND CORONARY ANGIOGRAPHY;  Surgeon: Lyn Records, MD;  Location: [facility] INVASIVE CV LAB;  Service: Cardiovascular;  Laterality: N/A;   MANDIBLE FRACTURE SURGERY     x 2   PAROTIDECTOMY Right 11/19/2018   Procedure: PAROTIDECTOMY;  Surgeon: Christia Reading, MD;  Location: [facility] OR;  Service: ENT;  Laterality: Right;   ROBOTIC ASSITED PARTIAL NEPHRECTOMY Right 04/04/2020   Procedure: XI ROBOTIC ASSITED PARTIAL NEPHRECTOMYINTRAOPATIVE ULTRASOUND;  Surgeon: Sebastian Ache, MD;  Location: [facility] ORS;  Service: Urology;  Laterality: Right;  3.5 HRS   TONSILLECTOMY      Current Medications: No outpatient medications have been marked as taking for the 05/11/23 encounter (Appointment) with Christell Constant, MD.     Allergies:   Bifidobacterium, Penicillins, Percocet [oxycodone-acetaminophen], Ace inhibitors, Align prebiotic-probiotic, Gabapentin, Lansoprazole, Lopressor [metoprolol], Rofecoxib, Simvastatin, Sulfamethoxazole-trimethoprim, Zoloft [sertraline], Codeine, and Oxycodone   Social History   Socioeconomic History   Marital status: Married    Spouse name: Not on file   Number of children: Not on file   Years of education: Not on file   Highest education level: Not on file  Occupational History   Not on file  Tobacco Use   Smoking status: Former    Packs/day: 0.90    Years: 15.00    Additional pack years: 0.00    Total pack years: 13.50    Types: Cigarettes    Quit date: 01/19/2006    Years since quitting: 17.3   Smokeless tobacco: Never  Vaping Use   Vaping Use: Never used  Substance and Sexual Activity   Alcohol use: No   Drug use: No   Sexual activity: Not on file  Other Topics Concern   Not on file  Social History Narrative   Not on file   Social Determinants of Health   Financial Resource Strain: Not on file  Food Insecurity:  Not on file  Transportation Needs: Not on file  Physical Activity: Not on file  Stress: Not on file  Social Connections: Not on file     Family History: The patient's family history includes Diabetes in his maternal grandfather. There is no history of Heart disease.  EKGs/Labs/Other Studies Reviewed:    The following studies were reviewed today: ***  EKG:  EKG is *** ordered today.  The ekg ordered today demonstrates ***  Cardiac Studies & Procedures   CARDIAC CATHETERIZATION  CARDIAC CATHETERIZATION 07/21/2018  Narrative  Moderate three-vessel coronary artery disease.  60 to 70% mid RCA and 95% stenosis of the distal right coronary beyond the origin of the PDA.  Normal left main  Mid LAD 60 to 70% stenosis beyond the origin of the dominant first diagonal.  Large ramus intermedius with luminal irregularity.  Relatively small distribution circumflex with 70% obstruction in the first marginal and total occlusion of the distal circumflex before the origin of the small second obtuse marginal.  There are faint left to left collaterals to the distal circumflex.  Moderately severe left ventricular dysfunction with global hypokinesis, EF 35%.  Normal LVEDP.  RECOMMENDATIONS:   In absence of significant/limiting anginal symptoms, would recommend anti-ischemic therapy with beta-blockade and long-acting nitrates.  If symptoms develop or become refractory PCI of the very distal RCA, and LAD would be possible.  Lesions were not angiographically critical, and therefore based on data from the Courage trial, medical therapy would seem to be adequate.  Recommend guideline directed therapy for systolic heart failure: Transition to Entresto from ARB, transition Tenormin to carvedilol, add mineralocorticoid receptor antagonist (Aldactone or eplerenone).  With reference to heart failure, diabetes management should include an SGLT 2 agent to decrease incidence of heart failure symptoms.   Finally it may be helpful to have restoration of sinus rhythm.  LV dysfunction is out of proportion to the degree of coronary disease.   Recommend Aspirin 81mg  daily for moderate CAD.  Findings Coronary Findings Diagnostic  Dominance: Right  Left Anterior Descending Prox LAD lesion is 70% stenosed.  Ramus Intermedius Vessel is large. Ramus lesion is 25% stenosed.  Left Circumflex Vessel is small. Mid Cx to Dist Cx lesion is 100% stenosed.  First Obtuse Marginal Branch Vessel is large in size. Ost 1st Mrg lesion is 75% stenosed.  Second Obtuse Marginal Branch Vessel is small in size.  Right Coronary Artery Mid RCA lesion is 65% stenosed.  Right Posterior Atrioventricular Artery Post Atrio lesion is 95% stenosed.  Intervention  No interventions have been documented.     ECHOCARDIOGRAM  ECHOCARDIOGRAM COMPLETE 03/07/2021  Narrative ECHOCARDIOGRAM REPORT    Patient Name:   Casey Deamer. Date of Exam: 03/07/2021 Medical Rec #:  [id number]           Height:       70.0 in Accession #:    [id number]          Weight:       218.0 lb Date of Birth:  [birth year]          BSA:          2.165 m Patient Age:    64 years            BP:           120/70 mmHg Patient Gender: M                   HR:           50 bpm. Exam Location:  [address]  Procedure: 2D Echo, Cardiac Doppler and Color Doppler  Indications:    I50.9 Congestive heart failure  History:        Patient has prior history of Echocardiogram examinations, most recent 12/30/2018. Risk Factors:Hypertension and Diabetes. Atrial Fibrillation. Sleep apnea. Chronic kidney disease.  Sonographer:    Daphine Deutscher RDCS Referring Phys: [phone] Barry Dienes [facility]  IMPRESSIONS   1. Left ventricular ejection fraction, by estimation, is 60 to 65%. The left ventricle has normal function. The left ventricle has no regional wall motion abnormalities. Left ventricular diastolic parameters are consistent with Grade I  diastolic dysfunction (impaired relaxation). Elevated left ventricular end-diastolic pressure. 2. Right ventricular systolic function is normal. The right ventricular size is normal. 3. The mitral valve is normal in structure. Mild mitral valve regurgitation. No evidence of mitral stenosis. 4. The aortic valve is calcified. There is mild calcification of the aortic valve. There is mild thickening of the aortic valve. Aortic valve regurgitation is moderate. No aortic stenosis is present. 5. Aortic dilatation noted. There is moderate dilatation of the ascending aorta, measuring 46 mm. 6. The inferior vena cava is normal in size with greater than 50% respiratory variability, suggesting right atrial pressure of 3 mmHg.  FINDINGS Left Ventricle: Left ventricular ejection fraction, by estimation, is 60 to 65%. The left ventricle has normal function. The left ventricle has no regional wall motion abnormalities. The left ventricular internal cavity size was normal in size. There is no left ventricular hypertrophy. Left ventricular diastolic parameters are consistent with Grade I diastolic dysfunction (impaired relaxation). Elevated left ventricular end-diastolic pressure.  Right Ventricle: The right ventricular size is normal. No increase in right ventricular wall thickness. Right ventricular systolic function is normal.  Left Atrium: Left atrial size was normal in size.  Right Atrium: Right atrial size was normal in size.  Pericardium: There is no evidence of pericardial effusion.  Mitral Valve: The mitral valve is normal in structure. Mild mitral valve regurgitation. No evidence of mitral valve stenosis.  Tricuspid Valve: The tricuspid valve is normal in structure. Tricuspid valve regurgitation is trivial. No evidence of tricuspid stenosis.  Aortic Valve: The aortic valve is calcified. There is mild calcification of the aortic valve. There is mild thickening of the aortic valve. Aortic valve  regurgitation is moderate. Aortic regurgitation PHT measures 661 msec. No aortic stenosis is present.  Pulmonic Valve: The pulmonic valve was normal in structure. Pulmonic valve regurgitation is not visualized. No evidence of pulmonic stenosis.  Aorta: Aortic dilatation noted. There is moderate dilatation of the ascending aorta, measuring 46 mm.  Venous: The inferior vena cava is normal in size with greater than 50% respiratory variability, suggesting right  atrial pressure of 3 mmHg.  IAS/Shunts: No atrial level shunt detected by color flow Doppler.   LEFT VENTRICLE PLAX 2D LVIDd:         4.20 cm  Diastology LVIDs:         2.60 cm  LV e' medial:    5.91 cm/s LV PW:         1.00 cm  LV E/e' medial:  14.6 LV IVS:        1.00 cm  LV e' lateral:   7.77 cm/s LVOT diam:     2.50 cm  LV E/e' lateral: 11.1 LV SV:         117 LV SV Index:   54 LVOT Area:     4.91 cm   RIGHT VENTRICLE             IVC RV Basal diam:  4.10 cm     IVC diam: 1.80 cm RV S prime:     16.00 cm/s TAPSE (M-mode): 2.8 cm  LEFT ATRIUM             Index       RIGHT ATRIUM           Index LA diam:        4.60 cm 2.12 cm/m  RA Area:     14.30 cm LA Vol (A2C):   49.3 ml 22.77 ml/m RA Volume:   37.00 ml  17.09 ml/m LA Vol (A4C):   52.5 ml 24.25 ml/m LA Biplane Vol: 51.3 ml 23.69 ml/m AORTIC VALVE LVOT Vmax:   103.20 cm/s LVOT Vmean:  67.550 cm/s LVOT VTI:    0.238 m AI PHT:      661 msec  AORTA Ao Root diam: 3.90 cm Ao Asc diam:  4.60 cm  MITRAL VALVE MV Area (PHT): 3.72 cm    SHUNTS MV Decel Time: 204 msec    Systemic VTI:  0.24 m MV E velocity: 86.40 cm/s  Systemic Diam: 2.50 cm MV A velocity: 87.00 cm/s MV E/A ratio:  0.99  Chilton Si MD Electronically signed by Chilton Si MD Signature Date/Time: 03/07/2021/4:15:11 PM    Final              Recent Labs: 04/06/2023: BUN 32; Creatinine, Ser 1.15; Magnesium 1.9; Potassium 4.4; Sodium 138  Recent Lipid Panel    Component Value  Date/Time   CHOL 141 01/29/2022 1009   TRIG 71 01/29/2022 1009   HDL 49 01/29/2022 1009   CHOLHDL 2.9 01/29/2022 1009   LDLCALC 78 01/29/2022 1009     Risk Assessment/Calculations:   {Does this patient have ATRIAL FIBRILLATION?:[phone]}  No BP recorded.  {Refresh Note OR Click here to enter BP  :1}***         Physical Exam:    VS:  There were no vitals taken for this visit.    Wt Readings from Last 3 Encounters:  04/06/23 218 lb 9.6 oz (99.2 kg)  11/13/22 210 lb 12.8 oz (95.6 kg)  10/07/22 213 lb 9.6 oz (96.9 kg)     GEN: *** Well nourished, well developed in no acute distress HEENT: Normal NECK: No JVD; No carotid bruits LYMPHATICS: No lymphadenopathy CARDIAC: ***RRR, no murmurs, rubs, gallops RESPIRATORY:  Clear to auscultation without rales, wheezing or rhonchi  ABDOMEN: Soft, non-tender, non-distended MUSCULOSKELETAL:  No edema; No deformity  SKIN: Warm and dry NEUROLOGIC:  Alert and oriented x 3 PSYCHIATRIC:  Normal affect   ASSESSMENT:    No diagnosis found.  PLAN:    CAD HLD and aortic atherosclerosis  HF recovered EF  P-AF controlled on Tikosn  Aortic dilation (mild 2022)      {Are you ordering a CV Procedure (e.g. stress test, cath, DCCV, TEE, etc)?   Press F2        :[id number]}    Medication Adjustments/Labs and Tests Ordered: Current medicines are reviewed at length with the patient today.  Concerns regarding medicines are outlined above.  No orders of the defined types were placed in this encounter.  No orders of the defined types were placed in this encounter.   There are no Patient Instructions on file for this visit.   Signed, Christell Constant, MD  05/10/2023 4:56 PM    [facility]

## 2023-05-11 ENCOUNTER — Ambulatory Visit: Payer: Medicare Other | Attending: Internal Medicine | Admitting: Internal Medicine

## 2023-05-11 ENCOUNTER — Encounter: Payer: Self-pay | Admitting: Internal Medicine

## 2023-05-11 ENCOUNTER — Other Ambulatory Visit (HOSPITAL_COMMUNITY): Payer: Self-pay | Admitting: Internal Medicine

## 2023-05-11 VITALS — BP 146/70 | HR 54 | Ht 70.0 in | Wt 222.0 lb

## 2023-05-11 DIAGNOSIS — Z8719 Personal history of other diseases of the digestive system: Secondary | ICD-10-CM | POA: Diagnosis not present

## 2023-05-11 DIAGNOSIS — M791 Myalgia, unspecified site: Secondary | ICD-10-CM | POA: Diagnosis not present

## 2023-05-11 DIAGNOSIS — I5022 Chronic systolic (congestive) heart failure: Secondary | ICD-10-CM | POA: Diagnosis not present

## 2023-05-11 DIAGNOSIS — I4819 Other persistent atrial fibrillation: Secondary | ICD-10-CM | POA: Insufficient documentation

## 2023-05-11 DIAGNOSIS — I77819 Aortic ectasia, unspecified site: Secondary | ICD-10-CM | POA: Insufficient documentation

## 2023-05-11 DIAGNOSIS — T466X5A Adverse effect of antihyperlipidemic and antiarteriosclerotic drugs, initial encounter: Secondary | ICD-10-CM | POA: Diagnosis not present

## 2023-05-11 DIAGNOSIS — E669 Obesity, unspecified: Secondary | ICD-10-CM | POA: Diagnosis not present

## 2023-05-11 DIAGNOSIS — Z9889 Other specified postprocedural states: Secondary | ICD-10-CM | POA: Diagnosis not present

## 2023-05-11 DIAGNOSIS — I2510 Atherosclerotic heart disease of native coronary artery without angina pectoris: Secondary | ICD-10-CM | POA: Diagnosis not present

## 2023-05-11 DIAGNOSIS — I700 Atherosclerosis of aorta: Secondary | ICD-10-CM | POA: Insufficient documentation

## 2023-05-11 DIAGNOSIS — I10 Essential (primary) hypertension: Secondary | ICD-10-CM | POA: Insufficient documentation

## 2023-05-11 DIAGNOSIS — Z9884 Bariatric surgery status: Secondary | ICD-10-CM | POA: Diagnosis not present

## 2023-05-11 DIAGNOSIS — E782 Mixed hyperlipidemia: Secondary | ICD-10-CM | POA: Insufficient documentation

## 2023-05-11 DIAGNOSIS — Z6831 Body mass index (BMI) 31.0-31.9, adult: Secondary | ICD-10-CM | POA: Diagnosis not present

## 2023-05-11 HISTORY — DX: Mixed hyperlipidemia: E78.2

## 2023-05-11 MED ORDER — AMLODIPINE BESYLATE 2.5 MG PO TABS
2.5000 mg | ORAL_TABLET | Freq: Every day | ORAL | 3 refills | Status: DC
Start: 2023-05-11 — End: 2024-02-08

## 2023-05-11 MED ORDER — EZETIMIBE 10 MG PO TABS
10.0000 mg | ORAL_TABLET | Freq: Every day | ORAL | 3 refills | Status: DC
Start: 2023-05-11 — End: 2024-02-08

## 2023-05-11 NOTE — Patient Instructions (Addendum)
Medication Instructions:  Your physician has recommended you make the following change in your medication:   Zetia 10 mg daily Norvasc 2.5 mg daily *If you need a refill on your cardiac medications before your next appointment, please call your pharmacy*   Lab Work: BMET today  ALT and lipids in 3 months If you have labs (blood work) drawn today and your tests are completely normal, you will receive your results only by: MyChart Message (if you have MyChart) OR A paper copy in the mail If you have any lab test that is abnormal or we need to change your treatment, we will call you to review the results.   Testing/Procedures: CT of aorta    Follow-Up: At [facility], you and your health needs are our priority.  As part of our continuing mission to provide you with exceptional heart care, we have created designated Provider Care Teams.  These Care Teams include your primary Cardiologist (physician) and Advanced Practice Providers (APPs -  Physician Assistants and Nurse Practitioners) who all work together to provide you with the care you need, when you need it.    Your next appointment:   January   Provider:   Tereso Newcomer or Dr Donell Sievert

## 2023-05-12 LAB — BASIC METABOLIC PANEL
BUN/Creatinine Ratio: 20 (ref 10–24)
BUN: 22 mg/dL (ref 8–27)
CO2: 26 mmol/L (ref 20–29)
Calcium: 9.2 mg/dL (ref 8.6–10.2)
Chloride: 104 mmol/L (ref 96–106)
Creatinine, Ser: 1.11 mg/dL (ref 0.76–1.27)
Glucose: 98 mg/dL (ref 70–99)
Potassium: 4.3 mmol/L (ref 3.5–5.2)
Sodium: 141 mmol/L (ref 134–144)
eGFR: 74 mL/min/{1.73_m2} (ref >59)

## 2023-05-14 DIAGNOSIS — M25511 Pain in right shoulder: Secondary | ICD-10-CM | POA: Diagnosis not present

## 2023-05-19 DIAGNOSIS — M25511 Pain in right shoulder: Secondary | ICD-10-CM | POA: Diagnosis not present

## 2023-05-20 ENCOUNTER — Other Ambulatory Visit: Payer: Self-pay | Admitting: Internal Medicine

## 2023-05-20 ENCOUNTER — Ambulatory Visit: Payer: Medicare Other | Attending: Cardiology | Admitting: *Deleted

## 2023-05-20 DIAGNOSIS — I4819 Other persistent atrial fibrillation: Secondary | ICD-10-CM | POA: Diagnosis not present

## 2023-05-20 DIAGNOSIS — Z5181 Encounter for therapeutic drug level monitoring: Secondary | ICD-10-CM | POA: Insufficient documentation

## 2023-05-20 LAB — POCT INR: INR: 2.7 (ref 2.0–3.0)

## 2023-05-20 NOTE — Patient Instructions (Signed)
Continue warfarin 1 tablet daily Recheck in 4 wks 

## 2023-06-15 ENCOUNTER — Telehealth: Payer: Self-pay | Admitting: Internal Medicine

## 2023-06-15 ENCOUNTER — Ambulatory Visit (HOSPITAL_COMMUNITY)
Admission: RE | Admit: 2023-06-15 | Discharge: 2023-06-15 | Disposition: A | Discharge status: Home / Self Care | Payer: Medicare Other | Source: Ambulatory Visit | Attending: Internal Medicine | Admitting: Internal Medicine

## 2023-06-15 DIAGNOSIS — I77819 Aortic ectasia, unspecified site: Secondary | ICD-10-CM | POA: Diagnosis not present

## 2023-06-15 DIAGNOSIS — I5022 Chronic systolic (congestive) heart failure: Secondary | ICD-10-CM | POA: Insufficient documentation

## 2023-06-15 DIAGNOSIS — R7989 Other specified abnormal findings of blood chemistry: Secondary | ICD-10-CM

## 2023-06-15 DIAGNOSIS — I10 Essential (primary) hypertension: Secondary | ICD-10-CM | POA: Insufficient documentation

## 2023-06-15 DIAGNOSIS — I700 Atherosclerosis of aorta: Secondary | ICD-10-CM | POA: Diagnosis not present

## 2023-06-15 DIAGNOSIS — I771 Stricture of artery: Secondary | ICD-10-CM | POA: Diagnosis not present

## 2023-06-15 DIAGNOSIS — I2510 Atherosclerotic heart disease of native coronary artery without angina pectoris: Secondary | ICD-10-CM | POA: Insufficient documentation

## 2023-06-15 DIAGNOSIS — I7121 Aneurysm of the ascending aorta, without rupture: Secondary | ICD-10-CM | POA: Diagnosis not present

## 2023-06-15 DIAGNOSIS — Z0189 Encounter for other specified special examinations: Secondary | ICD-10-CM

## 2023-06-15 DIAGNOSIS — Z79899 Other long term (current) drug therapy: Secondary | ICD-10-CM

## 2023-06-15 LAB — POCT I-STAT CREATININE: Creatinine, Ser: 1.7 mg/dL — ABNORMAL HIGH (ref 0.61–1.24)

## 2023-06-15 MED ORDER — IOHEXOL 350 MG/ML SOLN
75.0000 mL | Freq: Once | INTRAVENOUS | Status: AC | PRN
  Administered 2023-06-15: 75 mL via INTRAVENOUS

## 2023-06-15 NOTE — Telephone Encounter (Signed)
[name]. "Gala Romney" - 06/15/2023 11:24 AM Christell Constant, MD  Sent: Mon June 15, 2023 12:42 PM  To: Loa Socks, LPN  Cc: Vida Rigger [facility] Triage         Message  Let's have him take in PO and repeat in two weeks  IF still elevated will stop his MRA.    Spoke with the pt and endorsed to him recommendations as mentioned above by Dr. Izora Ribas.   Pt will come in for repeat BMET in 2 weeks on 06/29/23.    Advised him to work on increasing his po water intake in the meantime, between now and his 2 week bmet recheck.  Advised the pt that per Dr. Izora Ribas, if renal function stays elevated, we will stop his CT's and order imaging that has less impact on the kidneys.  Pt verbalized understanding and agrees with this plan.

## 2023-06-15 NOTE — Telephone Encounter (Signed)
Pt states they drew labs this morning prior to his CT and they suggested he call the office because his creatinine was too high. Please advise.

## 2023-06-15 NOTE — Telephone Encounter (Signed)
Pt is calling to let Dr. Izora Ribas know that he went and had his CT ANGIO AORTA GATED this morning, and they did a ISTAT creatinine on him prior to testing, and per protocol.  Pt states his creatinine was 1.70 at that time.   Pt states he was able to complete the CT but CT Dept advised him to follow-up with Dr. Izora Ribas about this, incase he wanted to advise on any additional recommendations regarding this matter.   Pt did mention to me that he avoided drinking any water this morning, for he states he didn't want to have to go to the bathroom during the test.  Last po fluid intake was late last night.   Informed the pt that typically when this happens, we advise pts to increase their po water intake for the next week, and come in for repeat BMET at that time. We also have some patients do this for several days with repeat bmet then.  Informed the pt either way, we will have Dr. Izora Ribas advise on this matter.   Did advise him to go ahead and start increasing his po water intake now.   Pt verbalized understanding and agrees with this plan.

## 2023-06-17 ENCOUNTER — Ambulatory Visit: Payer: Medicare Other | Attending: Cardiology | Admitting: *Deleted

## 2023-06-17 ENCOUNTER — Other Ambulatory Visit: Payer: Self-pay

## 2023-06-17 DIAGNOSIS — Z5181 Encounter for therapeutic drug level monitoring: Secondary | ICD-10-CM

## 2023-06-17 DIAGNOSIS — I4819 Other persistent atrial fibrillation: Secondary | ICD-10-CM

## 2023-06-17 DIAGNOSIS — I2510 Atherosclerotic heart disease of native coronary artery without angina pectoris: Secondary | ICD-10-CM

## 2023-06-17 DIAGNOSIS — I700 Atherosclerosis of aorta: Secondary | ICD-10-CM

## 2023-06-17 DIAGNOSIS — I5022 Chronic systolic (congestive) heart failure: Secondary | ICD-10-CM

## 2023-06-17 LAB — POCT INR: INR: 5.1 — AB (ref 2.0–3.0)

## 2023-06-17 NOTE — Patient Instructions (Addendum)
Hold warfarin tonight and tomorrow night then resume 1 tablet daily. Recheck in 10 days Had steroid injection Rt shoulder 2 wks ago  Denies S/S of excessive bruising or bleeding.  Bleeding and fall precautions discussed with pt and he verbalized understanding.

## 2023-06-19 ENCOUNTER — Other Ambulatory Visit: Payer: Self-pay

## 2023-06-19 DIAGNOSIS — I77819 Aortic ectasia, unspecified site: Secondary | ICD-10-CM

## 2023-06-19 DIAGNOSIS — I2510 Atherosclerotic heart disease of native coronary artery without angina pectoris: Secondary | ICD-10-CM

## 2023-06-19 DIAGNOSIS — I5022 Chronic systolic (congestive) heart failure: Secondary | ICD-10-CM

## 2023-06-19 NOTE — Progress Notes (Signed)
Ordered placed for CT angio chest aorta due in 1 yr and BMP prior. Per result note from [id number]: Stable aortic size (borderline aortic root dilation and mild ascending aortic dilation) Plan: Repeat study in one year   Christell Constant, MD

## 2023-06-29 ENCOUNTER — Ambulatory Visit: Payer: Medicare Other | Attending: Cardiology | Admitting: *Deleted

## 2023-06-29 ENCOUNTER — Other Ambulatory Visit: Payer: Medicare Other

## 2023-06-29 DIAGNOSIS — Z5181 Encounter for therapeutic drug level monitoring: Secondary | ICD-10-CM | POA: Insufficient documentation

## 2023-06-29 DIAGNOSIS — I4819 Other persistent atrial fibrillation: Secondary | ICD-10-CM | POA: Diagnosis not present

## 2023-06-29 LAB — POCT INR: INR: 1.4 — AB (ref 2.0–3.0)

## 2023-06-29 NOTE — Patient Instructions (Signed)
Take warfarin 1 1/2 tablets tonight and tomorrow night then resume 1 tablet daily   Recheck in 2 wks

## 2023-07-01 ENCOUNTER — Ambulatory Visit: Payer: Medicare Other | Attending: Internal Medicine

## 2023-07-01 DIAGNOSIS — E119 Type 2 diabetes mellitus without complications: Secondary | ICD-10-CM | POA: Diagnosis not present

## 2023-07-01 DIAGNOSIS — H5213 Myopia, bilateral: Secondary | ICD-10-CM | POA: Diagnosis not present

## 2023-07-01 DIAGNOSIS — H2513 Age-related nuclear cataract, bilateral: Secondary | ICD-10-CM | POA: Diagnosis not present

## 2023-07-02 ENCOUNTER — Ambulatory Visit: Payer: Medicare Other | Attending: Internal Medicine

## 2023-07-02 DIAGNOSIS — I5022 Chronic systolic (congestive) heart failure: Secondary | ICD-10-CM

## 2023-07-02 DIAGNOSIS — I77819 Aortic ectasia, unspecified site: Secondary | ICD-10-CM

## 2023-07-02 DIAGNOSIS — I2510 Atherosclerotic heart disease of native coronary artery without angina pectoris: Secondary | ICD-10-CM | POA: Diagnosis not present

## 2023-07-02 LAB — BASIC METABOLIC PANEL
BUN/Creatinine Ratio: 22 (ref 10–24)
BUN: 25 mg/dL (ref 8–27)
CO2: 20 mmol/L (ref 20–29)
Calcium: 9.5 mg/dL (ref 8.6–10.2)
Chloride: 102 mmol/L (ref 96–106)
Creatinine, Ser: 1.14 mg/dL (ref 0.76–1.27)
Glucose: 128 mg/dL — ABNORMAL HIGH (ref 70–99)
Potassium: 4.5 mmol/L (ref 3.5–5.2)
Sodium: 135 mmol/L (ref 134–144)
eGFR: 71 mL/min/{1.73_m2} (ref >59)

## 2023-07-14 ENCOUNTER — Ambulatory Visit: Payer: Medicare Other | Attending: Cardiology | Admitting: *Deleted

## 2023-07-14 DIAGNOSIS — I4819 Other persistent atrial fibrillation: Secondary | ICD-10-CM | POA: Diagnosis not present

## 2023-07-14 DIAGNOSIS — Z5181 Encounter for therapeutic drug level monitoring: Secondary | ICD-10-CM | POA: Insufficient documentation

## 2023-07-14 LAB — POCT INR: INR: 1.3 — AB (ref 2.0–3.0)

## 2023-07-14 NOTE — Patient Instructions (Signed)
Take warfarin 2 tablets tonight, 1 1/2 tablets tomorrow night then increase dose to 1 tablet daily except 1 1/2 tablets on Tuesdays, Thursdays and Saturdays. Recheck in 2 wks

## 2023-07-20 DIAGNOSIS — I10 Essential (primary) hypertension: Secondary | ICD-10-CM | POA: Diagnosis not present

## 2023-07-20 DIAGNOSIS — I700 Atherosclerosis of aorta: Secondary | ICD-10-CM | POA: Diagnosis not present

## 2023-07-20 DIAGNOSIS — D6869 Other thrombophilia: Secondary | ICD-10-CM | POA: Diagnosis not present

## 2023-07-20 DIAGNOSIS — E7800 Pure hypercholesterolemia, unspecified: Secondary | ICD-10-CM | POA: Diagnosis not present

## 2023-07-20 DIAGNOSIS — E1159 Type 2 diabetes mellitus with other circulatory complications: Secondary | ICD-10-CM | POA: Diagnosis not present

## 2023-07-20 DIAGNOSIS — I4891 Unspecified atrial fibrillation: Secondary | ICD-10-CM | POA: Diagnosis not present

## 2023-07-20 DIAGNOSIS — I5022 Chronic systolic (congestive) heart failure: Secondary | ICD-10-CM | POA: Diagnosis not present

## 2023-07-20 DIAGNOSIS — K279 Peptic ulcer, site unspecified, unspecified as acute or chronic, without hemorrhage or perforation: Secondary | ICD-10-CM | POA: Diagnosis not present

## 2023-07-20 DIAGNOSIS — I77819 Aortic ectasia, unspecified site: Secondary | ICD-10-CM | POA: Diagnosis not present

## 2023-07-30 ENCOUNTER — Ambulatory Visit: Payer: Medicare Other

## 2023-08-06 ENCOUNTER — Ambulatory Visit: Payer: Medicare Other | Attending: Cardiology | Admitting: *Deleted

## 2023-08-06 DIAGNOSIS — I4819 Other persistent atrial fibrillation: Secondary | ICD-10-CM | POA: Diagnosis not present

## 2023-08-06 DIAGNOSIS — Z5181 Encounter for therapeutic drug level monitoring: Secondary | ICD-10-CM | POA: Diagnosis not present

## 2023-08-06 LAB — POCT INR: INR: 6.1 — AB (ref 2.0–3.0)

## 2023-08-06 NOTE — Patient Instructions (Addendum)
Hold warfarin x 3 days (Thursday,Friday and Saturday) then decrease dose to 1 tablet daily Recheck in 1 wk Pt denies S/S of bleeding or excessive bruising.  Bleeding and fall precautions discussed with pt and he verbalized understanding.

## 2023-08-12 ENCOUNTER — Ambulatory Visit: Payer: Medicare Other | Attending: Cardiology | Admitting: *Deleted

## 2023-08-12 DIAGNOSIS — I4819 Other persistent atrial fibrillation: Secondary | ICD-10-CM | POA: Insufficient documentation

## 2023-08-12 DIAGNOSIS — Z5181 Encounter for therapeutic drug level monitoring: Secondary | ICD-10-CM | POA: Insufficient documentation

## 2023-08-12 LAB — POCT INR: INR: 1.9 — AB (ref 2.0–3.0)

## 2023-08-12 NOTE — Patient Instructions (Signed)
Take warfarin 1 1/2 tablets tonight then resume 1 tablet daily Recheck in 2 wks

## 2023-08-18 ENCOUNTER — Ambulatory Visit: Payer: Medicare Other | Attending: Internal Medicine

## 2023-08-18 DIAGNOSIS — I10 Essential (primary) hypertension: Secondary | ICD-10-CM

## 2023-08-18 DIAGNOSIS — I2510 Atherosclerotic heart disease of native coronary artery without angina pectoris: Secondary | ICD-10-CM | POA: Diagnosis not present

## 2023-08-18 DIAGNOSIS — I77819 Aortic ectasia, unspecified site: Secondary | ICD-10-CM | POA: Diagnosis not present

## 2023-08-18 DIAGNOSIS — I5022 Chronic systolic (congestive) heart failure: Secondary | ICD-10-CM

## 2023-08-26 ENCOUNTER — Ambulatory Visit: Payer: Medicare Other | Attending: Cardiology | Admitting: *Deleted

## 2023-08-26 DIAGNOSIS — I4819 Other persistent atrial fibrillation: Secondary | ICD-10-CM | POA: Insufficient documentation

## 2023-08-26 DIAGNOSIS — Z5181 Encounter for therapeutic drug level monitoring: Secondary | ICD-10-CM | POA: Diagnosis not present

## 2023-08-26 LAB — POCT INR: INR: 3.1 — AB (ref 2.0–3.0)

## 2023-08-26 NOTE — Patient Instructions (Signed)
Decrease warfarin to 1 tablet daily except 1/2 tablet on Wednesdays Recheck in 2 wks

## 2023-09-16 ENCOUNTER — Ambulatory Visit: Payer: Medicare Other | Attending: Cardiology | Admitting: *Deleted

## 2023-09-16 DIAGNOSIS — I4819 Other persistent atrial fibrillation: Secondary | ICD-10-CM | POA: Insufficient documentation

## 2023-09-16 DIAGNOSIS — Z5181 Encounter for therapeutic drug level monitoring: Secondary | ICD-10-CM | POA: Diagnosis not present

## 2023-09-16 LAB — POCT INR: INR: 4.6 — AB (ref 2.0–3.0)

## 2023-09-16 NOTE — Patient Instructions (Signed)
Hold warfarin tonight then decrease dose 1 tablet daily except 1/2 tablet Tuesdays, Thursdays and Saturdays Recheck in 2 wks

## 2023-09-29 ENCOUNTER — Ambulatory Visit: Payer: Medicare Other | Attending: Cardiology | Admitting: *Deleted

## 2023-09-29 DIAGNOSIS — Z5181 Encounter for therapeutic drug level monitoring: Secondary | ICD-10-CM | POA: Insufficient documentation

## 2023-09-29 DIAGNOSIS — I4819 Other persistent atrial fibrillation: Secondary | ICD-10-CM | POA: Insufficient documentation

## 2023-09-29 LAB — POCT INR: INR: 1.1 — AB (ref 2.0–3.0)

## 2023-09-29 NOTE — Patient Instructions (Signed)
Take warfarin 1 1/2 tablets tonight and tomorrow night then increase dose to 1 tablet daily except 1/2 tablet Tuesdays and Saturdays Recheck in 1 wk

## 2023-10-03 ENCOUNTER — Encounter: Payer: Self-pay | Admitting: Internal Medicine

## 2023-10-05 ENCOUNTER — Other Ambulatory Visit (HOSPITAL_COMMUNITY): Payer: Self-pay

## 2023-10-05 ENCOUNTER — Telehealth: Payer: Self-pay

## 2023-10-05 NOTE — Telephone Encounter (Signed)
PAP: Application for Sherryll Burger has been submitted to PAP Companies: [employer], via fax If update is requested in the meantime, please refer to [employer] at [phone]

## 2023-10-05 NOTE — Telephone Encounter (Signed)
Completed provider portion and faxed application to company.

## 2023-10-06 ENCOUNTER — Other Ambulatory Visit (HOSPITAL_COMMUNITY): Payer: Self-pay | Admitting: Urology

## 2023-10-06 ENCOUNTER — Ambulatory Visit (HOSPITAL_COMMUNITY)
Admission: RE | Admit: 2023-10-06 | Discharge: 2023-10-06 | Disposition: A | Discharge status: Home / Self Care | Payer: Medicare Other | Source: Ambulatory Visit | Attending: Urology | Admitting: Urology

## 2023-10-06 DIAGNOSIS — C641 Malignant neoplasm of right kidney, except renal pelvis: Secondary | ICD-10-CM | POA: Insufficient documentation

## 2023-10-06 DIAGNOSIS — Z125 Encounter for screening for malignant neoplasm of prostate: Secondary | ICD-10-CM | POA: Diagnosis not present

## 2023-10-06 DIAGNOSIS — I771 Stricture of artery: Secondary | ICD-10-CM | POA: Diagnosis not present

## 2023-10-06 NOTE — Telephone Encounter (Signed)
PAP: Patient assistance application for Sherryll Burger has been approved by PAP Companies: [employer] from 10/06/23 to 12/01/23. Medication should be delivered to PAP Delivery: Home For further shipping updates, please contact [employer] at [phone] Pt ID is: [id number]

## 2023-10-07 ENCOUNTER — Ambulatory Visit (HOSPITAL_COMMUNITY): Payer: Medicare Other | Admitting: Internal Medicine

## 2023-10-07 ENCOUNTER — Ambulatory Visit: Payer: Medicare Other | Attending: Cardiology | Admitting: *Deleted

## 2023-10-07 DIAGNOSIS — Z5181 Encounter for therapeutic drug level monitoring: Secondary | ICD-10-CM | POA: Insufficient documentation

## 2023-10-07 DIAGNOSIS — I4819 Other persistent atrial fibrillation: Secondary | ICD-10-CM | POA: Diagnosis not present

## 2023-10-07 LAB — POCT INR: INR: 2.4 (ref 2.0–3.0)

## 2023-10-07 NOTE — Patient Instructions (Signed)
Continue warfarin 1 tablet daily except 1/2 tablet Tuesdays and Saturdays Recheck in 1 wk

## 2023-10-08 DIAGNOSIS — C641 Malignant neoplasm of right kidney, except renal pelvis: Secondary | ICD-10-CM | POA: Diagnosis not present

## 2023-10-12 ENCOUNTER — Ambulatory Visit (HOSPITAL_COMMUNITY)
Admission: RE | Admit: 2023-10-12 | Discharge: 2023-10-12 | Disposition: A | Discharge status: Home / Self Care | Payer: Medicare Other | Source: Ambulatory Visit | Attending: Internal Medicine | Admitting: Internal Medicine

## 2023-10-12 VITALS — BP 130/72 | HR 55 | Ht 70.0 in | Wt 232.8 lb

## 2023-10-12 DIAGNOSIS — E119 Type 2 diabetes mellitus without complications: Secondary | ICD-10-CM | POA: Diagnosis present

## 2023-10-12 DIAGNOSIS — Z6833 Body mass index (BMI) 33.0-33.9, adult: Secondary | ICD-10-CM | POA: Diagnosis not present

## 2023-10-12 DIAGNOSIS — I10 Essential (primary) hypertension: Secondary | ICD-10-CM | POA: Diagnosis present

## 2023-10-12 DIAGNOSIS — I129 Hypertensive chronic kidney disease with stage 1 through stage 4 chronic kidney disease, or unspecified chronic kidney disease: Secondary | ICD-10-CM | POA: Insufficient documentation

## 2023-10-12 DIAGNOSIS — R9431 Abnormal electrocardiogram [ECG] [EKG]: Secondary | ICD-10-CM | POA: Insufficient documentation

## 2023-10-12 DIAGNOSIS — I4819 Other persistent atrial fibrillation: Secondary | ICD-10-CM | POA: Diagnosis not present

## 2023-10-12 DIAGNOSIS — N189 Chronic kidney disease, unspecified: Secondary | ICD-10-CM | POA: Insufficient documentation

## 2023-10-12 DIAGNOSIS — E669 Obesity, unspecified: Secondary | ICD-10-CM | POA: Insufficient documentation

## 2023-10-12 DIAGNOSIS — I4891 Unspecified atrial fibrillation: Secondary | ICD-10-CM

## 2023-10-12 DIAGNOSIS — I2510 Atherosclerotic heart disease of native coronary artery without angina pectoris: Secondary | ICD-10-CM | POA: Diagnosis not present

## 2023-10-12 DIAGNOSIS — D6869 Other thrombophilia: Secondary | ICD-10-CM

## 2023-10-12 DIAGNOSIS — Z7901 Long term (current) use of anticoagulants: Secondary | ICD-10-CM | POA: Insufficient documentation

## 2023-10-12 DIAGNOSIS — Z79899 Other long term (current) drug therapy: Secondary | ICD-10-CM | POA: Diagnosis not present

## 2023-10-12 DIAGNOSIS — Z5181 Encounter for therapeutic drug level monitoring: Secondary | ICD-10-CM | POA: Diagnosis not present

## 2023-10-12 DIAGNOSIS — E1122 Type 2 diabetes mellitus with diabetic chronic kidney disease: Secondary | ICD-10-CM | POA: Diagnosis not present

## 2023-10-12 LAB — BASIC METABOLIC PANEL
Anion gap: 8 (ref 5–15)
BUN: 17 mg/dL (ref 8–23)
CO2: 24 mmol/L (ref 22–32)
Calcium: 9 mg/dL (ref 8.9–10.3)
Chloride: 106 mmol/L (ref 98–111)
Creatinine, Ser: 1.08 mg/dL (ref 0.61–1.24)
GFR, Estimated: >60 mL/min (ref >60)
Glucose, Bld: 96 mg/dL (ref 70–99)
Potassium: 4.5 mmol/L (ref 3.5–5.1)
Sodium: 138 mmol/L (ref 135–145)

## 2023-10-12 LAB — MAGNESIUM: Magnesium: 1.8 mg/dL (ref 1.7–2.4)

## 2023-10-12 NOTE — Progress Notes (Signed)
Primary Care Physician: Joycelyn Rua, MD Referring Physician: Dr. Armanda Magic   Jabez D Brannon Damp. is a 67 y.o. male with a h/o obesity, s/p gastric sleeve, HTN, DM that presented for pre op for rt parotidectomy 8/8 and was found to be in rate controlled afib, from which he was asymptomatic. He was sent to the afib clinic for evaluation. 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 He has felt some fatigue, less stamina for several months. Was just seen by PCP yesterday and nothing out of the ordinary was noted with his heart rhythm. He denies any exertional chest pain.   He does not drink alcohol, no tobacco use,no excessive caffeine. He has kept his weight stable for several years. Was almost 100 lbs heavier before gastric sleeve.  I discussed with Dr. Johney Frame and he recommended an echo and if ok, he would be considered at low risk for surgery and could start anticoagulation after surgery. However, pt is now back in the office as his echo did show a reduced EF at 30-35%. Dr. Johney Frame discussed with Dr. Jenne Pane and it is felt most appropriate  to delay surgery in order to obtain  LHC to further determine his risk for surgery. He does have cardiac risk factors for CAD , ie , obesity,  HTN, DM. He denies any exertional chest pain but c/o fatigue/ low stamina  for several months/early summer. LHC did show 3 vessel  moderate  CAD  F/u in afib clinic, he is now been on anticoagulation x 2 weeks without interruption and can now schedule cardioversion after another week, he is in agreement.  F/u in afib clinic,9/20, he unfortunately had unsuccessful cardioversion and is back in the clinic to discuss antiarrythmic's.It was decided that he would come into the hospital for Tikosyn after he checked on the price of the drug.  F/u in afib clinic, 10/1, for admission for Tikosyn.  He will get the drug free for the rest  of the year as he had met his out of pocket deductible.   F/u in afib clinic 10/11. He is staying in SR on [place]. He feels improved. His reduced EF meds have been titrated by Dr. Katrinka Blazing.  F/u in afib clinic,01/07/19. He is in SR and has been doing well staying in rhythm on dofetilide. He ultimately had his parotid gland successfully removed and did not have any issues with the surgery, he was benign. He had a sleep study and it was positive for OSA. He is suppose to pick up his equipment soon.He has had an echo since restoring SR and has shown normalization of EF.  F/u in afib clinic 10/22, he is staying in SR with tikosyn. He has had neck surgeries and seeing some improvement in his pain.   F/u in afib clinic, 02/01/20. He continues to stay in SR with Tikosyn. Continues on eliquis with a CHA2DS2VASc of 2. Had a back injection last week and came off eliquis x 3 days. He is anticipating  a surgery for a rod to be placed in his cervical spine in the near future. Has had both covid shots.  Here in afib clinic 08/02/20,  for dofetilide screening. He reports that he  is doing well without any afib burden. EKG shows SR with stable qt. He also reports have renal CA and had Rt partial nephrectomy in May. It was felt the surgery  was successful and he did not have to have any f/u radiation or  Chemo. He remained in SR during the surgery. He has lost around 39 lbs form the surgery, mostly as he did not have much appetite for several weeks following surgery.   F/u in afib clinic, 01/30/21. He has not noted any afib. Remains on tikosyn. He feels well. No changes in health. 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 No issues  with eliquis for a CHA2DS2VASc score of 2.   F/u afib clinic, 08/07/21. He has not noted any afib. Compliant with Tikosyn and anticoagulation. Passed a kidney stone a while back.   F/u in afib clinic, 02/05/22. He is doing well on  tikosyn. No afib noted. Qtc stable. States will need a hernia repair in May.   F/u in afib clinic for Tikosyn surveillance, 10/07/22. He is staying in SR. 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 Qt is stable. Being compliant with tikosyn and eliquis.   F/u in afib clinic for Tikosyn surveillance, 04/06/23. He is in SR today. He is doing well overall and has not had any Afib since November. His main concern today is elevated systolic BP, essentially since November when his coreg was lowered from 12.5 BID to 6.25 mg BID. No missed doses of Tikosyn or Eliquis.  F/u in Afib clinic for Tikosyn surveillance, 10/12/23. He is in NSR today. No episodes of Afib since last office visit. His daughter has an Apple watch and spot checks him from time to time regarding his rhythm. No missed doses of Tikosyn. He is on coumadin managed by the coumadin clinic. He notes his levels are always off and interested in coming off the coumadin. He was on Eliquis but it was too expensive.   Today, he denies symptoms of palpitations, chest pain, shortness of breath, orthopnea, PND, lower extremity edema, dizziness, presyncope, syncope, or neurologic sequela.  The patient is tolerating medications without difficulties and is otherwise without complaint today.   Past Medical History:  Diagnosis Date   Arthritis    Atrial fibrillation (HCC) 07/2018   Cancer (HCC)    skin Ca on nose,kidney   Chronic kidney disease    Complication of anesthesia    diff. waking up   Diabetes mellitus    Dysrhythmia 07/2018   A-Fib   Headache(784.0)    Hypertension    dr Manus Gunning   pcp   Mixed hyperlipidemia 05/11/2023   Sleep apnea    prior to weight lose surgery-Dr. Henrietta Hoover not use CPAP   Past Surgical History:  Procedure Laterality Date   APPENDECTOMY     BACK SURGERY     5  back surgeries   CARDIAC CATHETERIZATION     CARDIOVERSION N/A 08/13/2018   Procedure:  CARDIOVERSION;  Surgeon: Laurey Morale, MD;  Location: [facility] ENDOSCOPY;  Service: Cardiovascular;  Laterality: N/A;   CARDIOVERSION N/A 09/02/2018   Procedure: CARDIOVERSION;  Surgeon: Vesta Mixer, MD;  Location: [facility] ENDOSCOPY;  Service: Cardiovascular;  Laterality: N/A;   CARPAL TUNNEL RELEASE     bil   COLON SURGERY     2004 for colon rupture   HERNIA REPAIR     LAPAROSCOPIC GASTRIC BANDING     LEFT HEART CATH AND CORONARY ANGIOGRAPHY N/A 07/21/2018   Procedure: LEFT HEART CATH AND CORONARY ANGIOGRAPHY;  Surgeon: Verdis Prime  W, MD;  Location: [facility] INVASIVE CV LAB;  Service: Cardiovascular;  Laterality: N/A;   MANDIBLE FRACTURE SURGERY     x 2   PAROTIDECTOMY Right 11/19/2018   Procedure: PAROTIDECTOMY;  Surgeon: Christia Reading, MD;  Location: [facility] OR;  Service: ENT;  Laterality: Right;   ROBOTIC ASSITED PARTIAL NEPHRECTOMY Right 04/04/2020   Procedure: XI ROBOTIC ASSITED PARTIAL NEPHRECTOMYINTRAOPATIVE ULTRASOUND;  Surgeon: Sebastian Ache, MD;  Location: [facility] ORS;  Service: Urology;  Laterality: Right;  3.5 HRS   TONSILLECTOMY      Current Outpatient Medications  Medication Sig Dispense Refill   amLODipine (NORVASC) 2.5 MG tablet Take 1 tablet (2.5 mg total) by mouth daily. 180 tablet 3   atorvastatin (LIPITOR) 10 MG tablet Take 10 mg by mouth at bedtime.   1   carvedilol (COREG) 6.25 MG tablet TAKE 1 TABLET BY MOUTH 2 TIMES DAILY WITH A MEAL. 180 tablet 3   Docusate Sodium (DSS) 100 MG CAPS Take 2 capsules by mouth as needed.     dofetilide (TIKOSYN) 500 MCG capsule Take 1 capsule (500 mcg total) by mouth 2 (two) times daily. 180 capsule 2   ENTRESTO 49-51 MG TAKE 1 TABLET BY MOUTH TWICE A DAY 180 tablet 3   ezetimibe (ZETIA) 10 MG tablet Take 1 tablet (10 mg total) by mouth daily. 90 tablet 3   glimepiride (AMARYL) 2 MG tablet Take 2 mg by mouth daily with breakfast.     glucose blood test strip USE TO TEST BLOOD SUGAR THREE TIMES A DAY     magnesium gluconate (MAGONATE) 500 MG tablet  Take 500 mg by mouth 2 (two) times daily.     metFORMIN (GLUCOPHAGE-XR) 500 MG 24 hr tablet Take 1,000 mg by mouth 2 (two) times daily.  1   Multiple Vitamin (ONE DAILY MULTIVITAMIN ADULT PO) Take 1 tablet by mouth daily.     Multiple Vitamins-Minerals (PRESERVISION AREDS PO) Take 1 capsule by mouth 2 (two) times daily.     pantoprazole (PROTONIX) 40 MG tablet Take 40 mg by mouth every morning.     spironolactone (ALDACTONE) 25 MG tablet Take 1 tablet (25 mg total) by mouth daily. (Patient taking differently: Take 12.5 mg by mouth 2 (two) times daily.) 90 tablet 1   topiramate (TOPAMAX) 25 MG tablet Take 25 mg by mouth 2 (two) times daily.      warfarin (COUMADIN) 5 MG tablet Take 1/2 tablet to 1 tablet by mouth daily as directed by Anticoagulation Clinic. 65 tablet 3   No current facility-administered medications for this encounter.    Allergies  Allergen Reactions   Bifidobacterium Other (See Comments)    Other Reaction(s): Abdominal Pain, GI Upset (intolerance), stomach upset   Penicillins Hives    Has patient had a PCN reaction causing immediate rash, facial/tongue/throat swelling, SOB or lightheadedness with hypotension: Unknown Has patient had a PCN reaction causing severe rash involving mucus membranes or skin necrosis: Unknown Has patient had a PCN reaction that required hospitalization: No Has patient had a PCN reaction occurring within the last 10 years: No Childhood reaction. If all of the above answers are "NO", then may proceed with Cephalosporin use.    Percocet [Oxycodone-Acetaminophen] Anxiety and Other (See Comments)    Hyperactivity & unhinged. Previously tolerated hydrocodone   Ace Inhibitors Other (See Comments)   Align Prebiotic-Probiotic Other (See Comments)   Gabapentin Other (See Comments)   Lansoprazole Other (See Comments)   Lopressor [Metoprolol] Other (See Comments)    Drops heart  rate- caused Ed visit   Rofecoxib Other (See Comments)   Simvastatin Other  (See Comments)   Sulfamethoxazole-Trimethoprim Other (See Comments)   Zoloft [Sertraline] Other (See Comments)   Codeine Anxiety   Oxycodone Anxiety   ROS- All systems are reviewed and negative except as per the HPI above  Physical Exam: Vitals:   10/12/23 1111  BP: 130/72  Pulse: (!) 55  Weight: 105.6 kg  Height: 5\' 10"  (1.778 m)    Wt Readings from Last 3 Encounters:  10/12/23 105.6 kg  05/11/23 100.7 kg  04/06/23 99.2 kg    Labs: Lab Results  Component Value Date   NA 135 07/02/2023   K 4.5 07/02/2023   CL 102 07/02/2023   CO2 20 07/02/2023   GLUCOSE 128 (H) 07/02/2023   BUN 25 07/02/2023   CREATININE 1.14 07/02/2023   CALCIUM 9.5 07/02/2023   MG 1.9 04/06/2023   Lab Results  Component Value Date   INR 2.4 10/07/2023   Lab Results  Component Value Date   CHOL 128 08/18/2023   HDL 45 08/18/2023   LDLCALC 70 08/18/2023   TRIG 61 08/18/2023   GEN- The patient is well appearing, alert and oriented x 3 today.   Neck - no JVD or carotid bruit noted Lungs- Clear to ausculation bilaterally, normal work of breathing Heart- Regular rate and rhythm, no murmurs, rubs or gallops, PMI not laterally displaced Extremities- no clubbing, cyanosis, or edema Skin - no rash or ecchymosis noted  EKG- Vent. rate 55 BPM PR interval 186 ms QRS duration 100 ms QT/QTcB 452/432 ms P-R-T axes 18 -10 -4 Sinus bradycardia with marked sinus arrhythmia Cannot rule out Anterior infarct , age undetermined Abnormal ECG When compared with ECG of 06-Apr-2023 11:18, PREVIOUS ECG IS PRESENT  Epic records reviewed  Echo 03/07/21-Study Conclusions   1. Left ventricular ejection fraction, by estimation, is 60 to 65%. The  left ventricle has normal function. The left ventricle has no regional  wall motion abnormalities. Left ventricular diastolic parameters are  consistent with Grade I diastolic  dysfunction (impaired relaxation). Elevated left ventricular end-diastolic  pressure.    2. Right ventricular systolic function is normal. The right ventricular  size is normal.   3. The mitral valve is normal in structure. Mild mitral valve  regurgitation. No evidence of mitral stenosis.   4. The aortic valve is calcified. There is mild calcification of the  aortic valve. There is mild thickening of the aortic valve. Aortic valve  regurgitation is moderate. No aortic stenosis is present.   5. Aortic dilatation noted. There is moderate dilatation of the ascending  aorta, measuring 46 mm.   6. The inferior vena cava is normal in size with greater than 50%  respiratory variability, suggesting right atrial pressure of 3 mmHg.    LHC- 8/21-Moderate three-vessel coronary artery disease. 60 to 70% mid RCA and 95% stenosis of the distal right coronary beyond the origin of the PDA. Normal left main Mid LAD 60 to 70% stenosis beyond the origin of the dominant first diagonal. Large ramus intermedius with luminal irregularity. Relatively small distribution circumflex with 70% obstruction in the first marginal and total occlusion of the distal circumflex before the origin of the small second obtuse marginal.  There are faint left to left collaterals to the distal circumflex. Moderately severe left ventricular dysfunction with global hypokinesis, EF 35%.  Normal LVEDP.      Assessment and Plan: 1. Persistent  Afib  Staying in normal rhythm  with dofetilide Continue drug at 500 mcg bid Qtc stable 360 ms Bmet/mag Continue OSA; on bipap  2.CHA2DS2VASc score of 4 (HTN,CAD, age, DM)  Continue coumadin as directed. Watchman pamphlet given; he will call us if decides he would like to speak with EP regarding being a candidate for procedure.   3. CAD / LV dysfunction EF has normalized with restoring SR No anginal symptoms  Continue current meds   4. HTN Stable continue current regimen  F/u here in 6 months for tikosyn surveillance    L[place] Bells, PA-C Afib Clinic [facility] [address] [place], [place] [address] [phone]

## 2023-10-13 DIAGNOSIS — N5201 Erectile dysfunction due to arterial insufficiency: Secondary | ICD-10-CM | POA: Diagnosis not present

## 2023-10-13 DIAGNOSIS — R3915 Urgency of urination: Secondary | ICD-10-CM | POA: Diagnosis not present

## 2023-10-13 DIAGNOSIS — C641 Malignant neoplasm of right kidney, except renal pelvis: Secondary | ICD-10-CM | POA: Diagnosis not present

## 2023-10-13 DIAGNOSIS — Z125 Encounter for screening for malignant neoplasm of prostate: Secondary | ICD-10-CM | POA: Diagnosis not present

## 2023-10-19 ENCOUNTER — Ambulatory Visit: Payer: Medicare Other | Attending: Cardiology | Admitting: *Deleted

## 2023-10-19 DIAGNOSIS — I4819 Other persistent atrial fibrillation: Secondary | ICD-10-CM | POA: Insufficient documentation

## 2023-10-19 DIAGNOSIS — Z5181 Encounter for therapeutic drug level monitoring: Secondary | ICD-10-CM | POA: Insufficient documentation

## 2023-10-19 LAB — POCT INR: INR: 1.1 — AB (ref 2.0–3.0)

## 2023-10-19 NOTE — Patient Instructions (Signed)
Take warfarin 1 1/2 tablets tonight and tomorrow then resume 1 tablet daily except 1/2 tablet Tuesdays and Saturdays Recheck in 1 wk

## 2023-10-27 ENCOUNTER — Ambulatory Visit: Payer: Medicare Other | Attending: Cardiology | Admitting: *Deleted

## 2023-10-27 DIAGNOSIS — I4819 Other persistent atrial fibrillation: Secondary | ICD-10-CM | POA: Diagnosis not present

## 2023-10-27 DIAGNOSIS — Z5181 Encounter for therapeutic drug level monitoring: Secondary | ICD-10-CM

## 2023-10-27 LAB — POCT INR: INR: 1.5 — AB (ref 2.0–3.0)

## 2023-10-27 NOTE — Patient Instructions (Signed)
Increase warfarin to 1 tablet daily  Recheck in 2 wk

## 2023-11-02 ENCOUNTER — Telehealth: Payer: Self-pay

## 2023-11-02 NOTE — Telephone Encounter (Signed)
Per Landry Mellow, called to schedule Watchman consult.  Left message to call back.

## 2023-11-02 NOTE — Telephone Encounter (Signed)
Scheduled the patient for Watchman consult 12/11/2023. He was grateful for call and agreed with plan.

## 2023-11-11 ENCOUNTER — Ambulatory Visit: Payer: Medicare Other | Attending: Cardiology | Admitting: *Deleted

## 2023-11-11 DIAGNOSIS — Z5181 Encounter for therapeutic drug level monitoring: Secondary | ICD-10-CM | POA: Diagnosis not present

## 2023-11-11 DIAGNOSIS — I4819 Other persistent atrial fibrillation: Secondary | ICD-10-CM | POA: Insufficient documentation

## 2023-11-11 LAB — POCT INR: INR: 3.1 — AB (ref 2.0–3.0)

## 2023-11-11 NOTE — Patient Instructions (Signed)
Continue warfarin 1 tablet daily  Eat salad or greens tonight then resume usual amount Recheck in 4 wk

## 2023-11-19 ENCOUNTER — Other Ambulatory Visit (HOSPITAL_COMMUNITY): Payer: Self-pay | Admitting: *Deleted

## 2023-11-19 MED ORDER — SPIRONOLACTONE 25 MG PO TABS: 12.5000 mg | ORAL_TABLET | Freq: Two times a day (BID) | ORAL | 2 refills | Status: DC

## 2023-12-04 ENCOUNTER — Ambulatory Visit: Payer: Medicare Other | Attending: Cardiology | Admitting: *Deleted

## 2023-12-04 DIAGNOSIS — Z5181 Encounter for therapeutic drug level monitoring: Secondary | ICD-10-CM | POA: Diagnosis not present

## 2023-12-04 DIAGNOSIS — I4819 Other persistent atrial fibrillation: Secondary | ICD-10-CM | POA: Diagnosis not present

## 2023-12-04 LAB — POCT INR: INR: 2.9 (ref 2.0–3.0)

## 2023-12-04 NOTE — Patient Instructions (Signed)
 Continue warfarin 1 tablet daily  Keep Vit K foods consistent  Recheck in 4 wk

## 2023-12-11 ENCOUNTER — Ambulatory Visit: Payer: Medicare Other | Attending: Cardiology | Admitting: Cardiology

## 2023-12-11 ENCOUNTER — Encounter: Payer: Self-pay | Admitting: Cardiology

## 2023-12-11 VITALS — BP 130/62 | HR 52 | Ht 70.0 in | Wt 229.4 lb

## 2023-12-11 DIAGNOSIS — I4819 Other persistent atrial fibrillation: Secondary | ICD-10-CM | POA: Insufficient documentation

## 2023-12-11 DIAGNOSIS — D6869 Other thrombophilia: Secondary | ICD-10-CM | POA: Insufficient documentation

## 2023-12-11 DIAGNOSIS — R791 Abnormal coagulation profile: Secondary | ICD-10-CM | POA: Insufficient documentation

## 2023-12-11 NOTE — Patient Instructions (Signed)
 Medication Instructions:  Your physician recommends that you continue on your current medications as directed. Please refer to the Current Medication list given to you today.  *If you need a refill on your cardiac medications before your next appointment, please call your pharmacy*  Testing/Procedures Watchman Your physician has requested that you have Left atrial appendage (LAA) closure device implantation is a procedure to put a small device in the LAA of the heart. The LAA is a small sac in the wall of the heart's left upper chamber. Blood clots can form in this area. The device, Watchman closes the LAA to help prevent a blood clot and stroke.  You will be contacted by Nurse Navigator, Karsten Fells to schedule your pre-procedure visit and procedure date. If you have any questions she can be reached at [phone].    Follow-Up: At [facility], you and your health needs are our priority.  As part of our continuing mission to provide you with exceptional heart care, we have created designated Provider Care Teams.  These Care Teams include your primary Cardiologist (physician) and Advanced Practice Providers (APPs -  Physician Assistants and Nurse Practitioners) who all work together to provide you with the care you need, when you need it.

## 2023-12-11 NOTE — Progress Notes (Signed)
 Electrophysiology Office Note:   Date:  12/11/2023  ID:  Casey JONETTA Henry Mickey., DOB Mar 13, 1956, MRN [id number]  Primary Cardiologist: Victory LELON Claudene DOUGLAS, MD (Inactive) Primary Heart Failure: None Electrophysiologist: Fonda Kitty, MD      History of Present Illness:   Casey D Mahamud Metts. is a 68 y.o. male with h/o obesity s/p gastric sleeve, HTN, DM, chronic systolic heart failure with recovered LVEF, and persistent AF on Tikosyn  who is being  seen today for evaluation for Watchman device implant at the request of Dr. Santo.  Patient has a longstanding history of atrial fibrillation.  He is now on Tikosyn .  He had at one point been on Eliquis  but due to changes in his insurance this became cost prohibitive, estimating $600 per month.  He was then changed to warfarin.  Unfortunately, he has had a very difficult time maintaining a therapeutic INR.  He has worked closely with our Coumadin  clinic and they have been able to come up with no cause for his fluctuations in INR.  For this reason, the patient is interested in [place] device implantation.  He has done some research on his own and discussed with family members and friends regarding the device.  He is otherwise doing relatively well and has no new or acute complaints today.  Review of systems complete and found to be negative unless listed in HPI.   EP Information / Studies Reviewed:    EKG is ordered today. Personal review as below.  EKG Interpretation Date/Time:  Friday December 11 2023 12:01:08 EST Ventricular Rate:  52 PR Interval:  178 QRS Duration:  94 QT Interval:  444 QTC Calculation: 412 R Axis:   10  Text Interpretation: Sinus bradycardia Cannot rule out Anterior infarct (cited on or before 07-Oct-2022) When compared with ECG of 12-Oct-2023 11:26, No significant change was found Confirmed by Kitty Fonda [phone]) on 12/11/2023 1:07:21 PM   Echo 03/07/21:  1. Left ventricular ejection fraction, by estimation, is 60 to  65%. The  left ventricle has normal function. The left ventricle has no regional  wall motion abnormalities. Left ventricular diastolic parameters are  consistent with Grade I diastolic  dysfunction (impaired relaxation). Elevated left ventricular end-diastolic  pressure.   2. Right ventricular systolic function is normal. The right ventricular  size is normal.   3. The mitral valve is normal in structure. Mild mitral valve  regurgitation. No evidence of mitral stenosis.   4. The aortic valve is calcified. There is mild calcification of the  aortic valve. There is mild thickening of the aortic valve. Aortic valve  regurgitation is moderate. No aortic stenosis is present.   5. Aortic dilatation noted. There is moderate dilatation of the ascending  aorta, measuring 46 mm.   6. The inferior vena cava is normal in size with greater than 50%  respiratory variability, suggesting right atrial pressure of 3 mmHg.   Risk Assessment/Calculations:    CHA2DS2-VASc Score = 5   This indicates a 7.2% annual risk of stroke. The patient's score is based upon: CHF History: 1 HTN History: 1 Diabetes History: 1 Stroke History: 0 Vascular Disease History: 1 Age Score: 1 Gender Score: 0             Physical Exam:   VS:  BP 130/62   Pulse (!) 52   Ht 5' 10 (1.778 m)   Wt 229 lb 6.4 oz (104.1 kg)   SpO2 96%   BMI 32.92 kg/m    Wt  Readings from Last 3 Encounters:  12/11/23 229 lb 6.4 oz (104.1 kg)  10/12/23 232 lb 12.8 oz (105.6 kg)  05/11/23 222 lb (100.7 kg)     GEN: Well nourished, well developed in no acute distress NECK: No JVD CARDIAC: Bradycardic and regular RESPIRATORY:  Clear to auscultation without rales, wheezing or rhonchi  ABDOMEN: Soft, non-tender, non-distended EXTREMITIES:  No edema; No deformity   ASSESSMENT AND PLAN:   Casey Dibello. is a 68 y.o. male with h/o obesity s/p gastric sleeve, HTN, DM, chronic systolic heart failure with recovered LVEF, and  persistent AF on Tikosyn  who is being  seen today for evaluation for Watchman device implant at the request of Dr. Santo.  I have seen Casey D Henry Mickey. in the office today who is being considered for a Watchman left atrial appendage closure device. I believe they will benefit from this procedure given their history of atrial fibrillation, CHA2DS2-VASc score of 5 and unadjusted ischemic stroke rate of 7.2% per year. Unfortunately, the patient is not felt to be a long term anticoagulation candidate secondary to inability to achieve stable consistent therapeutic INRs and inability to afford DOAC long-term.  He has also had GI bleeding in the past.  The patient's chart has been reviewed and I feel that they would be a candidate for short term oral anticoagulation after Watchman implant.   It is my belief that after undergoing a LAA closure procedure, Casey D Henry Mickey. will not need long term anticoagulation which eliminates anticoagulation side effects and major bleeding risk.   Procedural risks for the Watchman implant have been reviewed with the patient including a 0.5% risk of stroke, <1% risk of perforation and <1% risk of device embolization. Other risks include bleeding, vascular damage, tamponade, worsening renal function, and death. The patient understands these risk and wishes to proceed.    The published clinical data on the safety and effectiveness of WATCHMAN include but are not limited to the following: - Holmes DR, Jess BEARD, Sick P et al. for the PROTECT AF Investigators. Percutaneous closure of the left atrial appendage versus warfarin therapy for prevention of stroke in patients with atrial fibrillation: a randomised non-inferiority trial. Lancet 2009; 374: 534-42. GLENWOOD Jess BEARD, Doshi SK, Jonita VEAR Satchel D et al. on behalf of the PROTECT AF Investigators. Percutaneous Left Atrial Appendage Closure for Stroke Prophylaxis in Patients With Atrial Fibrillation 2.3-Year Follow-up of the  PROTECT AF (Watchman Left Atrial Appendage System for Embolic Protection in Patients With Atrial Fibrillation) Trial. Circulation 2013; 127:720-729. - Alli O, Doshi S,  Kar S, Reddy VY, Sievert H et al. Quality of Life Assessment in the Randomized PROTECT AF (Percutaneous Closure of the Left Atrial Appendage Versus Warfarin Therapy for Prevention of Stroke in Patients With Atrial Fibrillation) Trial of Patients at Risk for Stroke With Nonvalvular Atrial Fibrillation. J Am Coll Cardiol 2013; 61:1790-8. GLENWOOD Satchel DR, Archer RAMAN, Price M, Whisenant B, Sievert H, Doshi S, Huber K, Reddy V. Prospective randomized evaluation of the Watchman left atrial appendage Device in patients with atrial fibrillation versus long-term warfarin therapy; the PREVAIL trial. Journal of the [employer] of Cardiology, Vol. 4, No. 1, 2014, 1-11. - Kar S, Doshi SK, Sadhu A, Horton R, Osorio J et al. Primary outcome evaluation of a next-generation left atrial appendage closure device: results from the PINNACLE FLX trial. Circulation 2021;143(18)1754-1762.    After today's visit with the patient which was dedicated solely for shared decision making visit regarding LAA  closure device, the patient decided to proceed with the LAA appendage closure procedure scheduled to be done in the near future at [facility].  He has a prior CTA of his aorta from July 2024.  I will ask one of my imaging colleagues to view the study and assess left atrial appendage anatomy to see if it is suitable for watchman implantation.  Additionally, the patient will need need to transition to Eliquis  1 month prior to [facility] implantation and continue on this medication for 45 days after.  The patient and his wife are committed to paying for Eliquis  for this duration.  HAS-BLED score 4 Hypertension Yes  Abnormal renal and liver function (Dialysis, transplant, Cr >2.26 mg/dL /Cirrhosis or Bilirubin >2x Normal or AST/ALT/AP >3x Normal) No  Stroke No   Bleeding Yes  Labile INR (Unstable/high INR) Yes  Elderly (>65) Yes  Drugs or alcohol (>= 8 drinks/week, anti-plt or NSAID) No   CHA2DS2-VASc Score = 5  The patient's score is based upon: CHF History: 1 HTN History: 1 Diabetes History: 1 Stroke History: 0 Vascular Disease History: 1 Age Score: 1 Gender Score: 0       ASSESSMENT AND PLAN: Persistent Atrial Fibrillation (ICD10:  I48.19) The patient's CHA2DS2-VASc score is 5, indicating a 7.2% annual risk of stroke.   Continue dofetilide  and follow-up with the atrial fibrillation clinic.  Secondary Hypercoagulable State (ICD10:  D68.69) The patient is at significant risk for stroke/thromboembolism based upon his CHA2DS2-VASc Score of 5.  Start Apixaban  (Eliquis ) 1 month prior to watchman implantation.  He will continue on warfarin in the interim   Total time of encounter: 70 minutes total time of encounter, including chart review, face-to-face patient care, coordination of care and counseling regarding high complexity medical decision making  Signed, Fonda Kitty, MD

## 2023-12-14 ENCOUNTER — Telehealth: Payer: Self-pay

## 2023-12-14 NOTE — Telephone Encounter (Signed)
 This encounter was created in error - please disregard.

## 2023-12-14 NOTE — Telephone Encounter (Signed)
-----   Message from Stanly DELENA Leavens sent at 12/11/2023  6:30 PM EST ----- Regarding: RE: Era He had a non gated CT-aorta from 06/2023. It is patent, there is a wing appearance with several distal lobes. Maximal diameter assess was 22 mm.  Wall distance 18 mm.  A 20 or 24 mm Watchman FLX should work. Mid-posterior stick.  I don't suspect you will need a steerable catheter.  For those that aren't very small, very large or crazy anatomy, I think you should be fine without repeat study, unless the question is patency.   Thanks, MAC ----- Message ----- From: Kennyth Chew, MD Sent: 12/11/2023   1:18 PM EST To: Lamarr DELENA Redman, RN; Stanly DELENA Leavens, MD Subject: Era Stanly, This gentleman is a patient of Mahesh's who I saw today for watchman evaluation.  He has very labile INRs on warfarin and cannot afford DOAC long-term.  For this reason, I think watchman implant is reasonable.  He had a CTA of his aorta in July of last year.  Would you be able to look at this and give me a crude assessment of his left atrial appendage anatomy.  If you feel like study needs to be repeated then just let us  know and [facility] can arrange.   For Katy: Once scheduled, patient will need to transition to Eliquis  1 month prior to implant. He will remain on this for 45 days after implant.  Thanks, [employer]

## 2023-12-16 ENCOUNTER — Ambulatory Visit: Payer: Medicare Other | Admitting: Internal Medicine

## 2023-12-18 ENCOUNTER — Ambulatory Visit: Payer: Medicare Other | Attending: Internal Medicine | Admitting: Internal Medicine

## 2023-12-18 VITALS — BP 118/68 | HR 60 | Ht 70.0 in | Wt 230.0 lb

## 2023-12-18 DIAGNOSIS — I5022 Chronic systolic (congestive) heart failure: Secondary | ICD-10-CM | POA: Diagnosis not present

## 2023-12-18 DIAGNOSIS — D6869 Other thrombophilia: Secondary | ICD-10-CM | POA: Diagnosis not present

## 2023-12-18 DIAGNOSIS — I700 Atherosclerosis of aorta: Secondary | ICD-10-CM | POA: Insufficient documentation

## 2023-12-18 DIAGNOSIS — I10 Essential (primary) hypertension: Secondary | ICD-10-CM | POA: Diagnosis not present

## 2023-12-18 DIAGNOSIS — I4819 Other persistent atrial fibrillation: Secondary | ICD-10-CM | POA: Diagnosis not present

## 2023-12-18 NOTE — Patient Instructions (Signed)
Medication Instructions:  Your physician recommends that you continue on your current medications as directed. Please refer to the Current Medication list given to you today.  *If you need a refill on your cardiac medications before your next appointment, please call your pharmacy*   Lab Work: NONE If you have labs (blood work) drawn today and your tests are completely normal, you will receive your results only by: MyChart Message (if you have MyChart) OR A paper copy in the mail If you have any lab test that is abnormal or we need to change your treatment, we will call you to review the results.   Testing/Procedures: NONE   Follow-Up: At [facility], you and your health needs are our priority.  As part of our continuing mission to provide you with exceptional heart care, we have created designated Provider Care Teams.  These Care Teams include your primary Cardiologist (physician) and Advanced Practice Providers (APPs -  Physician Assistants and Nurse Practitioners) who all work together to provide you with the care you need, when you need it.    Your next appointment:   6 months   Provider:   Tereso Newcomer, PA

## 2023-12-18 NOTE — Progress Notes (Signed)
Cardiology Office Note:    Date:  12/18/2023   ID:  Casey Croon., DOB 1956/07/09, MRN [id number]  PCP:  Joycelyn Rua, MD   [facility] Providers Cardiologist:  Lesleigh Noe, MD (Inactive) Electrophysiologist:  Nobie Putnam, MD     Referring MD: Joycelyn Rua, MD   CC: WATCHMAN Eval   History of Present Illness:    Casey Lavinder. is a 67 y.o. male with a hx of obesity s/p gastric sleeve, HTN with DM, and persistent AF.  2020: Dr. Already HFrEF 30%; with moderate non obstructive CAD. Failed DCCV 2021 control on Tikosyn. 2022: Mild aortic dilation and aotrtic atherosclerosis. 2023: Stable Qtc  Casey [name], a 68 year old individual with a history of gastrointestinal bleeding requiring multiple transfusions in 2023, was referred for consideration of a Watchman FLX device. The patient had been previously managed by Dr. Katrinka Blazing and recently transitioned to my care.  The patient reported feeling well overall, with no complaints of chest pain, breathing difficulties, or palpitations. Blood pressure was well-controlled, and cholesterol levels were improving. The patient had a rare premature atrial contraction noted on examination, but this was not causing any symptoms or concerns.  The patient had a history of atrial fibrillation, for which he had been on warfarin for over four years. However, the patient's INR levels had been fluctuating, and there was a significant bleeding episode in the past.  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 The patient reported being active and busy, not wanting to retire despite his age and health conditions.  Past Medical History:  Diagnosis Date   Arthritis    Atrial fibrillation (HCC) 07/2018   Cancer (HCC)    skin Ca on nose,kidney   Chronic kidney disease    Complication of anesthesia    diff. waking up   Diabetes mellitus     Dysrhythmia 07/2018   A-Fib   Headache(784.0)    Hypertension    dr Manus Gunning   pcp   Mixed hyperlipidemia 05/11/2023   Sleep apnea    prior to weight lose surgery-Dr. Henrietta Hoover not use CPAP    Past Surgical History:  Procedure Laterality Date   APPENDECTOMY     BACK SURGERY     5  back surgeries   CARDIAC CATHETERIZATION     CARDIOVERSION N/A 08/13/2018   Procedure: CARDIOVERSION;  Surgeon: Laurey Morale, MD;  Location: [facility] ENDOSCOPY;  Service: Cardiovascular;  Laterality: N/A;   CARDIOVERSION N/A 09/02/2018   Procedure: CARDIOVERSION;  Surgeon: Vesta Mixer, MD;  Location: [facility] ENDOSCOPY;  Service: Cardiovascular;  Laterality: N/A;   CARPAL TUNNEL RELEASE     bil   COLON SURGERY     2004 for colon rupture   HERNIA REPAIR     LAPAROSCOPIC GASTRIC BANDING     LEFT HEART CATH AND CORONARY ANGIOGRAPHY N/A 07/21/2018   Procedure: LEFT HEART CATH AND CORONARY ANGIOGRAPHY;  Surgeon: Lyn Records, MD;  Location: [facility] INVASIVE CV LAB;  Service: Cardiovascular;  Laterality: N/A;   MANDIBLE FRACTURE SURGERY     x 2   PAROTIDECTOMY Right 11/19/2018   Procedure: PAROTIDECTOMY;  Surgeon: Christia Reading, MD;  Location: [facility] OR;  Service: ENT;  Laterality: Right;   ROBOTIC ASSITED PARTIAL NEPHRECTOMY Right 04/04/2020   Procedure: XI ROBOTIC ASSITED PARTIAL NEPHRECTOMYINTRAOPATIVE ULTRASOUND;  Surgeon: Sebastian Ache, MD;  Location: [facility]  ORS;  Service: Urology;  Laterality: Right;  3.5 HRS   TONSILLECTOMY      Current Medications: Current Meds  Medication Sig   amLODipine (NORVASC) 2.5 MG tablet Take 1 tablet (2.5 mg total) by mouth daily.   atorvastatin (LIPITOR) 10 MG tablet Take 10 mg by mouth at bedtime.    carvedilol (COREG) 6.25 MG tablet TAKE 1 TABLET BY MOUTH 2 TIMES DAILY WITH A MEAL.   Docusate Sodium (DSS) 100 MG CAPS Take 2 capsules by mouth as needed.   dofetilide (TIKOSYN) 500 MCG capsule Take 1 capsule (500 mcg total) by mouth 2 (two) times daily.   ENTRESTO 49-51 MG TAKE  1 TABLET BY MOUTH TWICE A DAY   ezetimibe (ZETIA) 10 MG tablet Take 1 tablet (10 mg total) by mouth daily.   glimepiride (AMARYL) 2 MG tablet Take 2 mg by mouth daily with breakfast.   glucose blood test strip USE TO TEST BLOOD SUGAR THREE TIMES A DAY   HYDROcodone-acetaminophen (NORCO) 10-325 MG tablet Take 1 tablet by mouth every 6 (six) hours as needed for severe pain (pain score 7-10).   magnesium gluconate (MAGONATE) 500 MG tablet Take 500 mg by mouth 2 (two) times daily.   metFORMIN (GLUCOPHAGE-XR) 500 MG 24 hr tablet Take 1,000 mg by mouth 2 (two) times daily.   Multiple Vitamin (ONE DAILY MULTIVITAMIN ADULT PO) Take 1 tablet by mouth daily.   Multiple Vitamins-Minerals (PRESERVISION AREDS PO) Take 1 capsule by mouth 2 (two) times daily.   pantoprazole (PROTONIX) 40 MG tablet Take 40 mg by mouth every morning.   spironolactone (ALDACTONE) 25 MG tablet Take 0.5 tablets (12.5 mg total) by mouth 2 (two) times daily.   topiramate (TOPAMAX) 25 MG tablet Take 25 mg by mouth 2 (two) times daily.    warfarin (COUMADIN) 5 MG tablet Take 1/2 tablet to 1 tablet by mouth daily as directed by Anticoagulation Clinic.     Allergies:   Bifidobacterium, Penicillins, Percocet [oxycodone-acetaminophen], Ace inhibitors, Align prebiotic-probiotic, Gabapentin, Lansoprazole, Lopressor [metoprolol], Rofecoxib, Simvastatin, Sulfamethoxazole-trimethoprim, Zoloft [sertraline], Codeine, and Oxycodone   Social History   Socioeconomic History   Marital status: Married    Spouse name: Not on file   Number of children: Not on file   Years of education: Not on file   Highest education level: Not on file  Occupational History   Not on file  Tobacco Use   Smoking status: Former    Current packs/day: 0.00    Average packs/day: 0.9 packs/day for 15.0 years (13.5 ttl pk-yrs)    Types: Cigarettes    Start date: 01/19/1991    Quit date: 01/19/2006    Years since quitting: 17.9   Smokeless tobacco: Never  Vaping Use    Vaping status: Never Used  Substance and Sexual Activity   Alcohol use: No   Drug use: No   Sexual activity: Not on file  Other Topics Concern   Not on file  Social History Narrative   Not on file   Social Drivers of Health   Financial Resource Strain: Low Risk  (10/26/2022)   [facility]   Overall Financial Resource Strain (CARDIA)    Difficulty of Paying Living Expenses: Not very hard  Food Insecurity: Low Risk  (05/01/2023)   [facility]   Hunger Vital Sign    Worried About Running Out of Food in the Last Year: Never true    Ran Out of Food in the  Last Year: Never true  Transportation Needs: No Transportation Needs (05/01/2023)   [facility]   Transportation    In the past 12 months, has lack of reliable transportation kept you from medical appointments, meetings, work or from getting things needed for daily living? : No  Physical Activity: Not on file  Stress: No Stress Concern Present (10/28/2022)   Received from [facility], [facility] Occup[facility] - Occupational Stress Questionnaire    Feeling of Stress : Not at all  Social Connections: Unknown (04/11/2022)   Received from [facility], [facility]   Social Network    Social Network: Not on file    Social: Retired Corporate treasurer, now a bus driver, married, with grandchildren  Family History: The patient's family history includes Diabetes in his maternal grandfather. There is no history of Heart disease.  EKGs/Labs/Other Studies Reviewed:    The following studies were reviewed today:   Cardiac Studies & Procedures   CARDIAC CATHETERIZATION  CARDIAC CATHETERIZATION 07/21/2018  Narrative  Moderate three-vessel coronary artery disease.  60 to 70% mid RCA and 95% stenosis of the distal right coronary beyond the origin of the PDA.  Normal left main  Mid LAD 60 to 70% stenosis  beyond the origin of the dominant first diagonal.  Large ramus intermedius with luminal irregularity.  Relatively small distribution circumflex with 70% obstruction in the first marginal and total occlusion of the distal circumflex before the origin of the small second obtuse marginal.  There are faint left to left collaterals to the distal circumflex.  Moderately severe left ventricular dysfunction with global hypokinesis, EF 35%.  Normal LVEDP.  RECOMMENDATIONS:   In absence of significant/limiting anginal symptoms, would recommend anti-ischemic therapy with beta-blockade and long-acting nitrates.  If symptoms develop or become refractory PCI of the very distal RCA, and LAD would be possible.  Lesions were not angiographically critical, and therefore based on data from the Courage trial, medical therapy would seem to be adequate.  Recommend guideline directed therapy for systolic heart failure: Transition to Entresto from ARB, transition Tenormin to carvedilol, add mineralocorticoid receptor antagonist (Aldactone or eplerenone).  With reference to heart failure, diabetes management should include an SGLT 2 agent to decrease incidence of heart failure symptoms.  Finally it may be helpful to have restoration of sinus rhythm.  LV dysfunction is out of proportion to the degree of coronary disease.   Recommend Aspirin 81mg  daily for moderate CAD.  Findings Coronary Findings Diagnostic  Dominance: Right  Left Anterior Descending Prox LAD lesion is 70% stenosed.  Ramus Intermedius Vessel is large. Ramus lesion is 25% stenosed.  Left Circumflex Vessel is small. Mid Cx to Dist Cx lesion is 100% stenosed.  First Obtuse Marginal Branch Vessel is large in size. Ost 1st Mrg lesion is 75% stenosed.  Second Obtuse Marginal Branch Vessel is small in size.  Right Coronary Artery Mid RCA lesion is 65% stenosed.  Right Posterior Atrioventricular Artery Post Atrio lesion is 95%  stenosed.  Intervention  No interventions have been documented.    ECHOCARDIOGRAM  ECHOCARDIOGRAM COMPLETE 03/07/2021  Narrative ECHOCARDIOGRAM REPORT    Patient Name:   Casey Aydelott. Date of Exam: 03/07/2021 Medical Rec #:  [id number]           Height:       70.0 in Accession #:    [id number]          Weight:  218.0 lb Date of Birth:  1955-12-19          BSA:          2.165 m Patient Age:    64 years            BP:           120/70 mmHg Patient Gender: M                   HR:           50 bpm. Exam Location:  [address]  Procedure: 2D Echo, Cardiac Doppler and Color Doppler  Indications:    I50.9 Congestive heart failure  History:        Patient has prior history of Echocardiogram examinations, most recent 12/30/2018. Risk Factors:Hypertension and Diabetes. Atrial Fibrillation. Sleep apnea. Chronic kidney disease.  Sonographer:    Daphine Deutscher RDCS Referring Phys: [phone] Barry Dienes [facility]  IMPRESSIONS   1. Left ventricular ejection fraction, by estimation, is 60 to 65%. The left ventricle has normal function. The left ventricle has no regional wall motion abnormalities. Left ventricular diastolic parameters are consistent with Grade I diastolic dysfunction (impaired relaxation). Elevated left ventricular end-diastolic pressure. 2. Right ventricular systolic function is normal. The right ventricular size is normal. 3. The mitral valve is normal in structure. Mild mitral valve regurgitation. No evidence of mitral stenosis. 4. The aortic valve is calcified. There is mild calcification of the aortic valve. There is mild thickening of the aortic valve. Aortic valve regurgitation is moderate. No aortic stenosis is present. 5. Aortic dilatation noted. There is moderate dilatation of the ascending aorta, measuring 46 mm. 6. The inferior vena cava is normal in size with greater than 50% respiratory variability, suggesting right atrial pressure of 3  mmHg.  FINDINGS Left Ventricle: Left ventricular ejection fraction, by estimation, is 60 to 65%. The left ventricle has normal function. The left ventricle has no regional wall motion abnormalities. The left ventricular internal cavity size was normal in size. There is no left ventricular hypertrophy. Left ventricular diastolic parameters are consistent with Grade I diastolic dysfunction (impaired relaxation). Elevated left ventricular end-diastolic pressure.  Right Ventricle: The right ventricular size is normal. No increase in right ventricular wall thickness. Right ventricular systolic function is normal.  Left [facility]: Left atrial size was normal in size.  Right [facility]: Right atrial size was normal in size.  Pericardium: There is no evidence of pericardial effusion.  Mitral Valve: The mitral valve is normal in structure. Mild mitral valve regurgitation. No evidence of mitral valve stenosis.  Tricuspid Valve: The tricuspid valve is normal in structure. Tricuspid valve regurgitation is trivial. No evidence of tricuspid stenosis.  Aortic Valve: The aortic valve is calcified. There is mild calcification of the aortic valve. There is mild thickening of the aortic valve. Aortic valve regurgitation is moderate. Aortic regurgitation PHT measures 661 msec. No aortic stenosis is present.  Pulmonic Valve: The pulmonic valve was normal in structure. Pulmonic valve regurgitation is not visualized. No evidence of pulmonic stenosis.  Aorta: Aortic dilatation noted. There is moderate dilatation of the ascending aorta, measuring 46 mm.  Venous: The inferior vena cava is normal in size with greater than 50% respiratory variability, suggesting right atrial pressure of 3 mmHg.  IAS/Shunts: No atrial level shunt detected by color flow Doppler.   LEFT VENTRICLE PLAX 2D LVIDd:         4.20 cm  Diastology LVIDs:  2.60 cm  LV e' medial:    5.91 cm/s LV PW:         1.00 cm  LV E/e' medial:  14.6 LV  IVS:        1.00 cm  LV e' lateral:   7.77 cm/s LVOT diam:     2.50 cm  LV E/e' lateral: 11.1 LV SV:         117 LV SV Index:   54 LVOT Area:     4.91 cm   RIGHT VENTRICLE             IVC RV Basal diam:  4.10 cm     IVC diam: 1.80 cm RV S prime:     16.00 cm/s TAPSE (M-mode): 2.8 cm  LEFT ATRIUM             Index       RIGHT ATRIUM           Index LA diam:        4.60 cm 2.12 cm/m  RA Area:     14.30 cm LA Vol (A2C):   49.3 ml 22.77 ml/m RA Volume:   37.00 ml  17.09 ml/m LA Vol (A4C):   52.5 ml 24.25 ml/m LA Biplane Vol: 51.3 ml 23.69 ml/m AORTIC VALVE LVOT Vmax:   103.20 cm/s LVOT Vmean:  67.550 cm/s LVOT VTI:    0.238 m AI PHT:      661 msec  AORTA Ao Root diam: 3.90 cm Ao Asc diam:  4.60 cm  MITRAL VALVE MV Area (PHT): 3.72 cm    SHUNTS MV Decel Time: 204 msec    Systemic VTI:  0.24 m MV E velocity: 86.40 cm/s  Systemic Diam: 2.50 cm MV A velocity: 87.00 cm/s MV E/A ratio:  0.99  Chilton Si MD Electronically signed by Chilton Si MD Signature Date/Time: 03/07/2021/4:15:11 PM    Final              Recent Labs: 08/18/2023: ALT 14 10/12/2023: BUN 17; Creatinine, Ser 1.08; Magnesium 1.8; Potassium 4.5; Sodium 138  Recent Lipid Panel    Component Value Date/Time   CHOL 128 08/18/2023 1008   TRIG 61 08/18/2023 1008   HDL 45 08/18/2023 1008   CHOLHDL 2.8 08/18/2023 1008   LDLCALC 70 08/18/2023 1008     Risk Assessment/Calculations:    CHA2DS2-VASc Score = 5   This indicates a 7.2% annual risk of stroke. The patient's score is based upon: CHF History: 1 HTN History: 1 Diabetes History: 1 Stroke History: 0 Vascular Disease History: 1 Age Score: 1 Gender Score: 0               Physical Exam:    VS:  BP 118/68 (BP Location: Left Arm)   Pulse 60   Ht 5\' 10"  (1.778 m)   Wt 230 lb (104.3 kg)   SpO2 96%   BMI 33.00 kg/m     Wt Readings from Last 3 Encounters:  12/18/23 230 lb (104.3 kg)  12/11/23 229 lb 6.4 oz (104.1 kg)   10/12/23 232 lb 12.8 oz (105.6 kg)    GEN:  Well nourished, well developed in no acute distress HEENT: Normal NECK: No JVD CARDIAC: Regular rhythm, no murmurs, rubs, gallops RESPIRATORY:  Clear to auscultation without rales, wheezing or rhonchi  ABDOMEN: Soft, non-tender, non-distended MUSCULOSKELETAL:  trace bilateral edema; No deformity  SKIN: Warm and dry NEUROLOGIC:  Alert and oriented x 3 PSYCHIATRIC:  Normal affect  ASSESSMENT:    1. Persistent atrial fibrillation (HCC)   2. Aortic atherosclerosis (HCC)   3. Hypertension, unspecified type   4. Hypercoagulable state due to persistent atrial fibrillation (HCC)   5. Chronic systolic heart failure (HCC)     PLAN:     HeartCare Referral for Left Atrial Appendage Closure with Non-Valvular Atrial Fibrillation   Casey D Leighland Misfeldt. is a 68 y.o. male is being referred to the [facility] Team for evaluation for Left Atrial Appendage Closure with Watchman device for the management of stroke risk resulting form non-valvular atrial fibrillation.    Base upon Casey [name]'s history, he is felt to be a poor candidate for long-term anticoagulation because of a history of bleeding (e.g. intracerebral, subdural, GI, retro-peritoneal).  The patient has a HAS-BLED score of 4 indicating a Yearly Major Bleeding Risk of 8.7%.      His CHADS2-VASc Score is 5 with an unadjusted Ischemic Stroke Rate (% per year) of 7.2%.    His stroke risk necessitates a strategy of stroke prevention with either long-term oral anticoagulation or left atrial appendage occlusion therapy. We have discussed their bleeding risk in the context of their comorbid medical problems, as well as the rationale for referral for evaluation of Watchman left atrial appendage occlusion therapy. While the patient is at high long-term bleeding risk, they may be appropriate for short-term anticoagulation. Based on this individual patient's stroke and bleeding risk, a  shared decision has been made to refer the patient for consideration of Watchman left atrial appendage closure utilizing the [employer] of Cardiology shared decision tool.   P-AF controlled on Tikosn Rare PACs - with a history of GI bleed in 2023 requiring multiple transfusions. Currently on warfarin with significant INR fluctuations and bleeding risks. Asymptomatic today with controlled blood pressure and cholesterol levels. Rare premature atrial contraction noted, not clinically significant. Discussed Watchman FLX device to reduce stroke risk and eliminate long-term anticoagulation. Explained procedure involves catheter insertion through the leg to the heart, deploying the device to seal off the left atrial appendage, followed by healing and a follow-up CT scan. Discussed risks including general surgical risks, but noted improved safety with the newest device generation. Patient prefers to avoid long-term anticoagulation due to previous bleeding complications. - Proceed with Watchman FLX device implantation. - Send a message to Casey [name] to ensure all necessary preparations for the Watchman procedure are in place. - Hold off on the planned CT scan as a follow-up CT will be done six weeks post-procedure to assess the device placement (CT was for aortic dilation)  CAD HLD and aortic atherosclerosis Statin myopathy - on coumadin managed by coumadin clinic (no ASA) - LDL controlled on current therapy  HF recovered EF HTN - NYHA I, Stage B, recovered with AF control - with current therapy BP and HR normalized - continue current therapy  Six months with Casey [name], one year with me        Medication Adjustments/Labs and Tests Ordered: Current medicines are reviewed at length with the patient today.  Concerns regarding medicines are outlined above.  Orders Placed This Encounter  Procedures   EKG 12-Lead   No orders of the defined types were placed in this  encounter.   Patient Instructions  Medication Instructions:  Your physician recommends that you continue on your current medications as directed. Please refer to the Current Medication list given to you today.  *If you need a refill on your cardiac medications  before your next appointment, please call your pharmacy*   Lab Work: NONE If you have labs (blood work) drawn today and your tests are completely normal, you will receive your results only by: MyChart Message (if you have MyChart) OR A paper copy in the mail If you have any lab test that is abnormal or we need to change your treatment, we will call you to review the results.   Testing/Procedures: NONE   Follow-Up: At [facility], you and your health needs are our priority.  As part of our continuing mission to provide you with exceptional heart care, we have created designated Provider Care Teams.  These Care Teams include your primary Cardiologist (physician) and Advanced Practice Providers (APPs -  Physician Assistants and Nurse Practitioners) who all work together to provide you with the care you need, when you need it.    Your next appointment:   6 months   Provider:   Tereso Newcomer, PA       Signed, Christell Constant, MD  12/18/2023 10:12 AM    [facility]

## 2023-12-24 ENCOUNTER — Telehealth: Payer: Self-pay

## 2023-12-24 ENCOUNTER — Other Ambulatory Visit: Payer: Self-pay

## 2023-12-24 DIAGNOSIS — I4819 Other persistent atrial fibrillation: Secondary | ICD-10-CM

## 2023-12-24 DIAGNOSIS — R791 Abnormal coagulation profile: Secondary | ICD-10-CM

## 2023-12-24 NOTE — Telephone Encounter (Signed)
Spoke with the patient at length.  He wishes to proceed with LAAO on 02/11/2024. Scheduled him for pre-procedure visit 01/29/2024. He has a scheduled appointment in the Coumadin Clinic 1/29. Will send message to them as FYI. The patient will need to STOP COUMADIN and START ELIQUIS 5 mg BID on January 14, 2024. The patient was grateful for call and agreed with plan.   Will arrange for CTA per Dr. Izora Ribas to be completed with post -LAAO CT.

## 2023-12-24 NOTE — Telephone Encounter (Signed)
-----   Message from Nobie Putnam sent at 12/16/2023  1:01 PM EST ----- Regarding: RE: [employer]. We can move forward with scheduling. Thanks [employer].   Josh ----- Message ----- From: Christell Constant, MD Sent: 12/11/2023   6:33 PM EST To: Henrietta Dine, RN; Nobie Putnam, MD Subject: RE: Sharyne Peach                                   He had a non gated CT-aorta from 06/2023. It is patent, there is a wing appearance with several distal lobes. Maximal diameter assess was 22 mm.  Wall distance 18 mm.  A 20 or 24 mm Watchman FLX should work. Mid-posterior stick.  I don't suspect you will need a steerable catheter.  For those that aren't very small, very large or crazy anatomy, I think you should be fine without repeat study, unless the question is patency.   Thanks, MAC ----- Message ----- From: Nobie Putnam, MD Sent: 12/11/2023   1:18 PM EST To: Henrietta Dine, RN; Christell Constant, MD Subject: Casey [name], This gentleman is a patient of Casey [name]'s who I saw today for watchman evaluation.  He has very labile INRs on warfarin and cannot afford DOAC long-term.  For this reason, I think watchman implant is reasonable.  He had a CTA of his aorta in July of last year.  Would you be able to look at this and give me a crude assessment of his left atrial appendage anatomy.  If you feel like study needs to be repeated then just let us know and Orpha Bur can arrange.   For Katy: Once scheduled, patient will need to transition to Eliquis 1 month prior to implant. He will remain on this for 45 days after implant.  Thanks, [employer]

## 2023-12-24 NOTE — Telephone Encounter (Signed)
Per Dr. Izora Ribas,  "From: Christell Constant, MD  Sent: 12/11/2023   6:33 PM EST  To: Henrietta Dine, RN; Nobie Putnam, MD  Subject: RE: Sharyne Peach                                   He had a non gated CT-aorta from 06/2023.  It is patent, there is a wing appearance with several distal lobes. Maximal diameter assess was 22 mm.  Wall distance 18 mm.  A 20 or 24 mm Watchman FLX should work.  Mid-posterior stick.  I don't suspect you will need a steerable catheter."   Dr. Jimmey Ralph reviewed and confirmed the patient may be scheduled for LAAO.

## 2023-12-30 ENCOUNTER — Ambulatory Visit: Payer: Medicare Other | Attending: Cardiology | Admitting: *Deleted

## 2023-12-30 DIAGNOSIS — I4819 Other persistent atrial fibrillation: Secondary | ICD-10-CM | POA: Insufficient documentation

## 2023-12-30 DIAGNOSIS — Z5181 Encounter for therapeutic drug level monitoring: Secondary | ICD-10-CM | POA: Insufficient documentation

## 2023-12-30 LAB — POCT INR: INR: 2.5 (ref 2.0–3.0)

## 2023-12-30 MED ORDER — APIXABAN 5 MG PO TABS: 5.0000 mg | ORAL_TABLET | Freq: Two times a day (BID) | ORAL | 3 refills | Status: DC

## 2023-12-30 NOTE — Patient Instructions (Addendum)
Continue warfarin 1 tablet daily  Keep Vit K foods consistent  Recheck in 2 wk Change to Eliquis 01/13/24 visit for Watchman procedure on 02/11/24

## 2024-01-13 ENCOUNTER — Ambulatory Visit: Payer: Medicare Other | Attending: Cardiology | Admitting: *Deleted

## 2024-01-13 DIAGNOSIS — I4819 Other persistent atrial fibrillation: Secondary | ICD-10-CM | POA: Diagnosis not present

## 2024-01-13 DIAGNOSIS — Z5181 Encounter for therapeutic drug level monitoring: Secondary | ICD-10-CM | POA: Diagnosis not present

## 2024-01-13 LAB — POCT INR: INR: 2 (ref 2.0–3.0)

## 2024-01-13 NOTE — Patient Instructions (Signed)
Stop warfarin today. Start Eliquis 5mg  twice daily tomorrow 01/14/24  Pending Watchman Procedure 02/11/24

## 2024-01-26 DIAGNOSIS — I4891 Unspecified atrial fibrillation: Secondary | ICD-10-CM | POA: Diagnosis not present

## 2024-01-26 DIAGNOSIS — I700 Atherosclerosis of aorta: Secondary | ICD-10-CM | POA: Diagnosis not present

## 2024-01-26 DIAGNOSIS — K279 Peptic ulcer, site unspecified, unspecified as acute or chronic, without hemorrhage or perforation: Secondary | ICD-10-CM | POA: Diagnosis not present

## 2024-01-26 DIAGNOSIS — I10 Essential (primary) hypertension: Secondary | ICD-10-CM | POA: Diagnosis not present

## 2024-01-26 DIAGNOSIS — E1159 Type 2 diabetes mellitus with other circulatory complications: Secondary | ICD-10-CM | POA: Diagnosis not present

## 2024-01-26 DIAGNOSIS — I5022 Chronic systolic (congestive) heart failure: Secondary | ICD-10-CM | POA: Diagnosis not present

## 2024-01-26 DIAGNOSIS — E7800 Pure hypercholesterolemia, unspecified: Secondary | ICD-10-CM | POA: Diagnosis not present

## 2024-01-26 DIAGNOSIS — D6869 Other thrombophilia: Secondary | ICD-10-CM | POA: Diagnosis not present

## 2024-01-29 ENCOUNTER — Ambulatory Visit: Payer: Medicare Other | Admitting: Physician Assistant

## 2024-01-29 VITALS — BP 128/72 | HR 64 | Ht 70.0 in | Wt 227.0 lb

## 2024-01-29 DIAGNOSIS — I10 Essential (primary) hypertension: Secondary | ICD-10-CM | POA: Insufficient documentation

## 2024-01-29 DIAGNOSIS — D6869 Other thrombophilia: Secondary | ICD-10-CM | POA: Diagnosis not present

## 2024-01-29 DIAGNOSIS — I4819 Other persistent atrial fibrillation: Secondary | ICD-10-CM | POA: Insufficient documentation

## 2024-01-29 DIAGNOSIS — I700 Atherosclerosis of aorta: Secondary | ICD-10-CM | POA: Insufficient documentation

## 2024-01-29 DIAGNOSIS — I5032 Chronic diastolic (congestive) heart failure: Secondary | ICD-10-CM | POA: Insufficient documentation

## 2024-01-29 DIAGNOSIS — I2510 Atherosclerotic heart disease of native coronary artery without angina pectoris: Secondary | ICD-10-CM | POA: Insufficient documentation

## 2024-01-29 NOTE — Patient Instructions (Signed)
 Medication Instructions:   Your physician recommends that you continue on your current medications as directed. Please refer to the Current Medication list given to you today.   *If you need a refill on your cardiac medications before your next appointment, please call your pharmacy*   Lab Work:  TODAY!!!! BMET/CBC  If you have labs (blood work) drawn today and your tests are completely normal, you will receive your results only by: MyChart Message (if you have MyChart) OR A paper copy in the mail If you have any lab test that is abnormal or we need to change your treatment, we will call you to review the results.   Testing/Procedures:  None ordered.   Follow-Up: At [facility], you and your health needs are our priority.  As part of our continuing mission to provide you with exceptional heart care, we have created designated Provider Care Teams.  These Care Teams include your primary Cardiologist (physician) and Advanced Practice Providers (APPs -  Physician Assistants and Nurse Practitioners) who all work together to provide you with the care you need, when you need it.  We recommend signing up for the patient portal called "MyChart".  Sign up information is provided on this After Visit Summary.  MyChart is used to connect with patients for Virtual Visits (Telemedicine).  Patients are able to view lab/test results, encounter notes, upcoming appointments, etc.  Non-urgent messages can be sent to your provider as well.   To learn more about what you can do with MyChart, go to [email or web address].    Your next appointment:   3 month(s)  Provider:   Tereso Newcomer, PA-C         Other Instructions    1st Floor: - Lobby - Registration  - Pharmacy  - Lab - Cafe  2nd Floor: - PV Lab - Diagnostic Testing (echo, CT, nuclear med)  3rd Floor: - Vacant  4th Floor: - TCTS (cardiothoracic surgery) - AFib Clinic - Structural Heart Clinic - Vascular Surgery  -  Vascular Ultrasound  5th Floor: - HeartCare Cardiology (general and EP) - Clinical Pharmacy for coumadin, hypertension, lipid, weight-loss medications, and med management appointments    Valet parking services will be available as well.

## 2024-01-29 NOTE — Progress Notes (Signed)
 [facility]   MULTIDISCIPLINARY HEART VALVE CLINIC                                     Cardiology Office Note:    Date:  01/29/2024   ID:  Casey Croon., DOB Jan 08, 1956, MRN [id number]  PCP:  Casey Rua, MD  [facility] Cardiologist:  Casey Constant, MD  [facility] Electrophysiologist:  Casey Putnam, MD   Referring MD: Casey Rua, MD   Pre Watchman visit - scheduled for 02/11/24 w/ Dr. Jimmey [name]  History of Present Illness:    Casey [name]. is a 68 y.o. male with a hx of obesity s/p gastric sleeve, RCC s/p right partial nephrectomy, HTN, DMT2, TAA, HFimpEF, moderate AI, aortic atherosclerosis, non obst CAD, aortic atherosclerosis, history of GI bleeding and persistent AF on Tikosyn and Coumadin who presents to clinic to discuss his upcoming LAAO with Watchman.  Patient has a longstanding history of atrial fibrillation.  He is now on Tikosyn.  He had at one point been on Eliquis but due to changes in his insurance this became cost prohibitive, estimating $600 per month.  He was then changed to warfarin.  Unfortunately, he has had a very difficult time maintaining a therapeutic INR.  He has worked closely with our Coumadin clinic and they have been able to come up with no cause for his fluctuations in INR. He also has a history of GI bleed in 2023 requiring multiple transfusions. For these reasons, the patient is interested in Watchman device implantation and seen by Dr. Jimmey [name] on 12/11/23 for consultation. He had a non gated CT-aorta from 06/2023 that was reviewed and felt to have anatomy amendable for Watchman.   Today the patient presents to clinic for follow up. Switched from Coumadin to Eliquis 2 weeks ago in anticipation of this Watchman procedure. Has had a slight headache and lethargic since starting Eliquis. Feels better after lunch. No CP or SOB. No LE edema, orthopnea or PND. No dizziness or syncope. No blood in stool or urine. No  palpitations.   Past Medical History:  Diagnosis Date   Arthritis    Atrial fibrillation (HCC) 07/2018   Cancer (HCC)    skin Ca on nose,kidney   Chronic kidney disease    Complication of anesthesia    diff. waking up   Diabetes mellitus    Dysrhythmia 07/2018   A-Fib   Headache(784.0)    Hypertension    dr Manus Gunning   pcp   Mixed hyperlipidemia 05/11/2023   Sleep apnea    prior to weight lose surgery-Dr. Henrietta Hoover not use CPAP     Current Medications: Current Meds  Medication Sig   apixaban (ELIQUIS) 5 MG TABS tablet Take 1 tablet (5 mg total) by mouth 2 (two) times daily.   atorvastatin (LIPITOR) 10 MG tablet Take 10 mg by mouth at bedtime.    carvedilol (COREG) 6.25 MG tablet TAKE 1 TABLET BY MOUTH 2 TIMES DAILY WITH A MEAL.   Docusate Sodium (DSS) 100 MG CAPS Take 2 capsules by mouth as needed.   dofetilide (TIKOSYN) 500 MCG capsule Take 1 capsule (500 mcg total) by mouth 2 (two) times daily.   ENTRESTO 49-51 MG TAKE 1 TABLET BY MOUTH TWICE A DAY   glimepiride (AMARYL) 2 MG tablet Take 2 mg by mouth daily with breakfast.   glucose blood test strip USE  TO TEST BLOOD SUGAR THREE TIMES A DAY   HYDROcodone-acetaminophen (NORCO) 10-325 MG tablet Take 1 tablet by mouth every 6 (six) hours as needed for severe pain (pain score 7-10).   magnesium gluconate (MAGONATE) 500 MG tablet Take 500 mg by mouth 2 (two) times daily.   metFORMIN (GLUCOPHAGE-XR) 500 MG 24 hr tablet Take 1,000 mg by mouth 2 (two) times daily.   Multiple Vitamin (ONE DAILY MULTIVITAMIN ADULT PO) Take 1 tablet by mouth daily.   Multiple Vitamins-Minerals (PRESERVISION AREDS PO) Take 1 capsule by mouth 2 (two) times daily.   pantoprazole (PROTONIX) 40 MG tablet Take 40 mg by mouth every morning.   spironolactone (ALDACTONE) 25 MG tablet Take 0.5 tablets (12.5 mg total) by mouth 2 (two) times daily.   topiramate (TOPAMAX) 25 MG tablet Take 25 mg by mouth 2 (two) times daily.       ROS:   Please see the  history of present illness.    All other systems reviewed and are negative.  EKGs   EKG Interpretation Date/Time:  Friday January 29 2024 10:16:46 EST Ventricular Rate:  64 PR Interval:  190 QRS Duration:  104 QT Interval:  428 QTC Calculation: 441 R Axis:   -22  Text Interpretation: Normal sinus rhythm Confirmed by Cline Crock [phone]) on 01/29/2024 10:20:46 AM   Risk Assessment/Calculations:    HAS-BLED score 4 Hypertension Yes  Abnormal renal and liver function (Dialysis, transplant, Cr >2.26 mg/dL /Cirrhosis or Bilirubin >2x Normal or AST/ALT/AP >3x Normal) No  Stroke No  Bleeding Yes  Labile INR (Unstable/high INR) Yes  Elderly (>65) Yes  Drugs or alcohol (>= 8 drinks/week, anti-plt or NSAID) No    CHA2DS2-VASc Score = 5  The patient's score is based upon: CHF History: 1 HTN History: 1 Diabetes History: 1 Stroke History: 0 Vascular Disease History: 1 Age Score: 1 Gender Score: 0     Physical Exam:    VS:  BP 128/72   Pulse 64   Ht 5\' 10"  (1.778 m)   Wt 227 lb (103 kg)   SpO2 97%   BMI 32.57 kg/m     Wt Readings from Last 3 Encounters:  01/29/24 227 lb (103 kg)  12/18/23 230 lb (104.3 kg)  12/11/23 229 lb 6.4 oz (104.1 kg)     GEN: Well nourished, well developed in no acute distress, obese NECK: No JVD CARDIAC:  RRR, no murmurs, rubs, gallops RESPIRATORY:  Clear to auscultation without rales, wheezing or rhonchi  ABDOMEN: Soft, non-tender, non-distended EXTREMITIES:  No edema; No deformity.   ASSESSMENT:    1. Persistent atrial fibrillation (HCC)   2. Hypercoagulable state due to persistent atrial fibrillation (HCC)   3. Heart failure with improved ejection fraction (HFimpEF) (HCC)   4. Hypertension, unspecified type   5. Aortic atherosclerosis (HCC)   6. Coronary artery disease involving native coronary artery of native heart without angina pectoris     PLAN:    In order of problems listed above:  Persistent atrial fibrillation: --  ECG with sinus today.  -- Continue on Tikosyn 500 mcg BID. -- Continue Eliquis 5mg  BID. -- Not a good long term candidate for OAC given labile INRs on coumadin, prohibitive cost of DOACs and previous GI bleeding. -- He had a non gated CT-aorta from 06/2023 that was reviewed and felt to have anatomy amendable for Watchman.  -- CBC/BMET today. Given instruction letter with soap  HFimpEF: -- EF 60-65% by echo in 03/2021. -- NYHA class I  symptoms -- Continue Entresto 49-51 mg BID.  -- Continue spironolactone 12.5mg  daily.  -- Continue Coreg 6.25mg  BID  HTN: -- BP well controlled at 128/72 today. -- Continue Entresto 49-51 mg BID, spironolactone 12.5mg  daily, Coreg 6.25mg  BID and Norvasc 2.5mg  daily.   Aortic atherosclerosis/non obst CAD: -- Consider destination therapy with aspirin after Watchman -- Continue statin   Medication Adjustments/Labs and Tests Ordered: Current medicines are reviewed at length with the patient today.  Concerns regarding medicines are outlined above.  Orders Placed This Encounter  Procedures   Basic Metabolic Panel (BMET)   CBC   EKG 12-Lead   No orders of the defined types were placed in this encounter.   Patient Instructions  Medication Instructions:   Your physician recommends that you continue on your current medications as directed. Please refer to the Current Medication list given to you today.   *If you need a refill on your cardiac medications before your next appointment, please call your pharmacy*   Lab Work:  TODAY!!!! BMET/CBC  If you have labs (blood work) drawn today and your tests are completely normal, you will receive your results only by: MyChart Message (if you have MyChart) OR A paper copy in the mail If you have any lab test that is abnormal or we need to change your treatment, we will call you to review the results.   Testing/Procedures:  None ordered.   Follow-Up: At [facility], you and your health needs  are our priority.  As part of our continuing mission to provide you with exceptional heart care, we have created designated Provider Care Teams.  These Care Teams include your primary Cardiologist (physician) and Advanced Practice Providers (APPs -  Physician Assistants and Nurse Practitioners) who all work together to provide you with the care you need, when you need it.  We recommend signing up for the patient portal called "MyChart".  Sign up information is provided on this After Visit Summary.  MyChart is used to connect with patients for Virtual Visits (Telemedicine).  Patients are able to view lab/test results, encounter notes, upcoming appointments, etc.  Non-urgent messages can be sent to your provider as well.   To learn more about what you can do with MyChart, go to [email or web address].    Your next appointment:   3 month(s)  Provider:   Tereso Newcomer, PA-C         Other Instructions    1st Floor: - Lobby - Registration  - Pharmacy  - Lab - Cafe  2nd Floor: - PV Lab - Diagnostic Testing (echo, CT, nuclear med)  3rd Floor: - Vacant  4th Floor: - TCTS (cardiothoracic surgery) - AFib Clinic - Structural Heart Clinic - Vascular Surgery  - Vascular Ultrasound  5th Floor: - HeartCare Cardiology (general and EP) - Clinical Pharmacy for coumadin, hypertension, lipid, weight-loss medications, and med management appointments    Valet parking services will be available as well.         Signed, Cline Crock, PA-C  01/29/2024 10:31 AM    [facility]

## 2024-01-30 LAB — BASIC METABOLIC PANEL
BUN/Creatinine Ratio: 19 (ref 10–24)
BUN: 24 mg/dL (ref 8–27)
CO2: 20 mmol/L (ref 20–29)
Calcium: 9.5 mg/dL (ref 8.6–10.2)
Chloride: 104 mmol/L (ref 96–106)
Creatinine, Ser: 1.24 mg/dL (ref 0.76–1.27)
Glucose: 120 mg/dL — ABNORMAL HIGH (ref 70–99)
Potassium: 4.5 mmol/L (ref 3.5–5.2)
Sodium: 140 mmol/L (ref 134–144)
eGFR: 64 mL/min/{1.73_m2} (ref >59)

## 2024-01-30 LAB — CBC
Hematocrit: 44.3 % (ref 37.5–51.0)
Hemoglobin: 14.7 g/dL (ref 13.0–17.7)
MCH: 30.2 pg (ref 26.6–33.0)
MCHC: 33.2 g/dL (ref 31.5–35.7)
MCV: 91 fL (ref 79–97)
Platelets: 233 10*3/uL (ref 150–450)
RBC: 4.86 x10E6/uL (ref 4.14–5.80)
RDW: 14.2 % (ref 11.6–15.4)
WBC: 8.3 10*3/uL (ref 3.4–10.8)

## 2024-02-10 ENCOUNTER — Telehealth: Payer: Self-pay

## 2024-02-10 NOTE — Anesthesia Preprocedure Evaluation (Signed)
 Anesthesia Evaluation  Patient identified by MRN, date of birth, ID band Patient awake    Reviewed: Allergy & Precautions, NPO status , Patient's Chart, lab work & pertinent test results, reviewed documented beta blocker date and time   Airway Mallampati: III  TM Distance: >3 FB Neck ROM: Full    Dental no notable dental hx. (+) Teeth Intact, Dental Advisory Given   Pulmonary sleep apnea (does not use CPAP) , former smoker   Pulmonary exam normal breath sounds clear to auscultation       Cardiovascular hypertension, Pt. on medications and Pt. on home beta blockers + CAD  Normal cardiovascular exam+ dysrhythmias (eliquis) Atrial Fibrillation + Valvular Problems/Murmurs (mod AI) AI  Rhythm:Irregular Rate:Normal  TTE 2022  1. Left ventricular ejection fraction, by estimation, is 60 to 65%. The  left ventricle has normal function. The left ventricle has no regional  wall motion abnormalities. Left ventricular diastolic parameters are  consistent with Grade I diastolic  dysfunction (impaired relaxation). Elevated left ventricular end-diastolic  pressure.   2. Right ventricular systolic function is normal. The right ventricular  size is normal.   3. The mitral valve is normal in structure. Mild mitral valve  regurgitation. No evidence of mitral stenosis.   4. The aortic valve is calcified. There is mild calcification of the  aortic valve. There is mild thickening of the aortic valve. Aortic valve  regurgitation is moderate. No aortic stenosis is present.   5. Aortic dilatation noted. There is moderate dilatation of the ascending  aorta, measuring 46 mm.   6. The inferior vena cava is normal in size with greater than 50%  respiratory variability, suggesting right atrial pressure of 3 mmHg.   Cath 2019  Moderate three-vessel coronary artery disease.  60 to 70% mid RCA and 95% stenosis of the distal right coronary beyond the origin  of the PDA.  Normal left main  Mid LAD 60 to 70% stenosis beyond the origin of the dominant first diagonal.  Large ramus intermedius with luminal irregularity.  Relatively small distribution circumflex with 70% obstruction in the first marginal and total occlusion of the distal circumflex before the origin of the small second obtuse marginal.  There are faint left to left collaterals to the distal circumflex.  Moderately severe left ventricular dysfunction with global hypokinesis, EF 35%.  Normal LVEDP.     RECOMMENDATIONS:    In absence of significant/limiting anginal symptoms, would recommend anti-ischemic therapy with beta-blockade and long-acting nitrates.  If symptoms develop or become refractory PCI of the very distal RCA, and LAD would be possible.  Lesions were not angiographically critical, and therefore based on data from the Courage trial, medical therapy would seem to be adequate.  Recommend guideline directed therapy for systolic heart failure: Transition to Entresto from ARB, transition Tenormin to carvedilol, add mineralocorticoid receptor antagonist (Aldactone or eplerenone).  With reference to heart failure, diabetes management should include an SGLT 2 agent to decrease incidence of heart failure symptoms.  Finally it may be helpful to have restoration of sinus rhythm.  LV dysfunction is out of proportion to the degree of coronary disease.       Neuro/Psych  Headaches  negative psych ROS   GI/Hepatic negative GI ROS, Neg liver ROS,,,  Endo/Other  diabetes, Type 2, Oral Hypoglycemic Agents    Renal/GU Renal InsufficiencyRenal diseaseLab Results      Component                Value  Date                      NA                       138                 02/11/2024                CL                       105                 02/11/2024                K                        4.3                 02/11/2024                CO2                      24                   02/11/2024                BUN                      26 (H)              02/11/2024                CREATININE               1.47 (H)            02/11/2024                GFRNONAA                 52 (L)              02/11/2024                CALCIUM                  9.4                 02/11/2024                ALBUMIN                  4.1                 01/29/2022                GLUCOSE                  132 (H)             02/11/2024             negative genitourinary   Musculoskeletal  (+) Arthritis ,    Abdominal   Peds  Hematology negative hematology ROS (+)   Anesthesia Other Findings   Reproductive/Obstetrics                             Anesthesia Physical  Anesthesia Plan  ASA: 3  Anesthesia Plan: General   Post-op Pain Management: Minimal or no pain anticipated   Induction: Intravenous  PONV Risk Score and Plan: 2 and Midazolam, Dexamethasone and Ondansetron  Airway Management Planned: Oral ETT  Additional Equipment: ClearSight  Intra-op Plan:   Post-operative Plan: Extubation in OR  Informed Consent: I have reviewed the patients History and Physical, chart, labs and discussed the procedure including the risks, benefits and alternatives for the proposed anesthesia with the patient or authorized representative who has indicated his/her understanding and acceptance.     Dental advisory given  Plan Discussed with: CRNA  Anesthesia Plan Comments: (PAT note written 02/10/2024 by Shonna Chock, PA-C.  )       Anesthesia Quick Evaluation

## 2024-02-10 NOTE — Progress Notes (Signed)
 Anesthesia Chart Review: Casey [name]   Case: [id number] Date/Time: 02/11/24 0730   Procedures:      LEFT ATRIAL APPENDAGE OCCLUSION     TRANSESOPHAGEAL ECHOCARDIOGRAM   Anesthesia type: General   Pre-op diagnosis: afib   [facility]ATH [facility] / [facility]   Providers: Nobie Putnam, MD       DISCUSSION: Patient is a 68 year old male scheduled for the above procedure.  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  Previous prolonged emergence post anesthesia.  Anesthesia team to evaluate on the day of procedure. Medication instructions provided per cardiology team.  VS:  BP Readings from Last 3 Encounters:  01/29/24 128/72  12/18/23 118/68  12/11/23 130/62   Pulse Readings from Last 3 Encounters:  01/29/24 64  12/18/23 60  12/11/23 (!) 52     PROVIDERS: Joycelyn Rua, MD is PCP  Nobie Putnam, MD is EP cardiologist Riley Lam, MD is primary cardiologist   LABS: Most recent lab results in [facility] include: Lab Results  Component Value Date   WBC 8.3 01/29/2024   HGB 14.7 01/29/2024   HCT 44.3 01/29/2024   PLT 233 01/29/2024   GLUCOSE 120 (H) 01/29/2024   CHOL 128 08/18/2023   TRIG 61 08/18/2023   HDL 45 08/18/2023   LDLCALC 70 08/18/2023   ALT 14 08/18/2023   AST 19 01/29/2022   NA 140 01/29/2024   K 4.5 01/29/2024   CL 104 01/29/2024   CREATININE 1.24 01/29/2024   BUN 24 01/29/2024   CO2 20 01/29/2024   INR 2.0 01/13/2024     IMAGES: CTA Chest/Aorta 06/15/23: IMPRESSION: 1. 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 This is similar to incrementally enlarged compared to 4.2 cm measured previously. Recommend annual imaging followup by CTA or MRA. This recommendation follows 2010 ACCF/AHA/AATS/ACR/ASA/SCA/SCAI/SIR/STS/SVM Guidelines for the Diagnosis and Management of Patients with Thoracic Aortic Disease. Circulation. 2010; 121: U440-H474. 2. Thoracic aortic and multivessel coronary artery atherosclerotic vascular calcifications. 3. Cardiomegaly. 4. Mildly thickened and calcified aortic valve. 5. Surgical changes of prior lap band procedure.    EKG: 01/29/24: Normal sinus rhythm Confirmed by Cline Crock [phone]) on 01/29/2024 10:20:46 AM   CV: Echo 03/07/2021 IMPRESSIONS   1. Left ventricular ejection fraction, by estimation, is 60 to 65%. The  left ventricle has normal function. The left ventricle has no regional  wall motion abnormalities. Left ventricular diastolic parameters are  consistent with Grade I diastolic  dysfunction (impaired relaxation). Elevated left ventricular end-diastolic  pressure.   2. Right ventricular systolic function is normal. The right ventricular  size is normal.   3. The mitral valve is normal in structure. Mild mitral valve  regurgitation. No evidence of mitral stenosis.   4. The aortic valve is calcified. There is mild calcification of the  aortic valve. There is mild thickening of the aortic valve. Aortic valve  regurgitation is moderate. No aortic stenosis is present.   5. Aortic dilatation noted. There is moderate dilatation of the ascending  aorta, measuring 46 mm.   6. The inferior vena cava is normal in size with greater than 50%  respiratory variability, suggesting right atrial pressure of 3 mmHg.  Cardiac Cath 07/21/2018 Moderate three-vessel coronary artery disease. 60 to 70% mid RCA and 95% stenosis of the distal right coronary beyond the origin of the PDA. Normal left main Mid LAD 60 to 70% stenosis beyond the origin of the  dominant first diagonal. Large ramus intermedius with luminal irregularity. Relatively small distribution circumflex with 70% obstruction in the first marginal and total occlusion of the distal circumflex before the origin of the small second obtuse marginal.  There are faint left to left collaterals to the distal circumflex. Moderately severe left ventricular dysfunction with global hypokinesis, EF 35%.  Normal LVEDP.     RECOMMENDATIONS:   In absence of significant/limiting anginal symptoms, would recommend anti-ischemic therapy with beta-blockade and long-acting nitrates.  If symptoms develop or become refractory PCI of the very distal RCA, and LAD would be possible.  Lesions were not angiographically critical, and therefore based on data from the Courage trial, medical therapy would seem to be adequate. Recommend guideline directed therapy for systolic heart failure: Transition to Entresto from ARB, transition Tenormin to carvedilol, add mineralocorticoid receptor antagonist (Aldactone or eplerenone).  With reference to heart failure, diabetes management should include an SGLT 2 agent to decrease incidence of heart failure symptoms. Finally it may be helpful to have restoration of sinus rhythm.  LV dysfunction is out of proportion to the degree of coronary disease.   Past Medical History:  Diagnosis Date   Arthritis    Atrial fibrillation (HCC) 07/2018   Cancer (HCC)    skin Ca on nose,kidney   Chronic kidney disease    Complication of anesthesia    diff. waking up   Diabetes mellitus    Dysrhythmia 07/2018   A-Fib   Headache(784.0)    Hypertension    dr Manus Gunning   pcp   Mixed hyperlipidemia 05/11/2023   Sleep apnea    prior to weight lose surgery-Dr. Henrietta Hoover not use CPAP    Past Surgical History:  Procedure Laterality Date   APPENDECTOMY     BACK SURGERY     5  back surgeries   CARDIAC CATHETERIZATION     CARDIOVERSION N/A 08/13/2018   Procedure: CARDIOVERSION;   Surgeon: Laurey Morale, MD;  Location: [facility] ENDOSCOPY;  Service: Cardiovascular;  Laterality: N/A;   CARDIOVERSION N/A 09/02/2018   Procedure: CARDIOVERSION;  Surgeon: Vesta Mixer, MD;  Location: [facility] ENDOSCOPY;  Service: Cardiovascular;  Laterality: N/A;   CARPAL TUNNEL RELEASE     bil   COLON SURGERY     2004 for colon rupture   HERNIA REPAIR     LAPAROSCOPIC GASTRIC BANDING     LEFT HEART CATH AND CORONARY ANGIOGRAPHY N/A 07/21/2018   Procedure: LEFT HEART CATH AND CORONARY ANGIOGRAPHY;  Surgeon: Lyn Records, MD;  Location[facility] CV LAB;  Service: Cardiovascular;  Laterality: N/A;   MANDIBLE FRACTURE SURGERY     x 2   PAROTIDECTOMY Right 11/19/2018   Procedure: PAROTIDECTOMY;  Surgeon: Christia Reading, MD;  Location: [facility] OR;  Service: ENT;  Laterality: Right;   ROBOTIC ASSITED PARTIAL NEPHRECTOMY Right 04/04/2020   Procedure: XI ROBOTIC ASSITED PARTIAL NEPHRECTOMYINTRAOPATIVE ULTRASOUND;  Surgeon: Sebastian Ache, MD;  Location: [facility] ORS;  Service: Urology;  Laterality: Right;  3.5 HRS   TONSILLECTOMY      MEDICATIONS: No current facility-administered medications for this encounter.    amLODipine (NORVASC) 2.5 MG tablet   apixaban (ELIQUIS) 5 MG TABS tablet   atorvastatin (LIPITOR) 10 MG tablet   carvedilol (COREG) 6.25 MG  tablet   Docusate Sodium (DSS) 100 MG CAPS   dofetilide (TIKOSYN) 500 MCG capsule   ENTRESTO 49-51 MG   ezetimibe (ZETIA) 10 MG tablet   glimepiride (AMARYL) 2 MG tablet   HYDROcodone-acetaminophen (NORCO) 10-325 MG tablet   magnesium gluconate (MAGONATE) 500 MG tablet   metFORMIN (GLUCOPHAGE-XR) 500 MG 24 hr tablet   Multiple Vitamin (ONE DAILY MULTIVITAMIN ADULT PO)   Multiple Vitamins-Minerals (PRESERVISION AREDS PO)   pantoprazole (PROTONIX) 40 MG tablet   spironolactone (ALDACTONE) 25 MG tablet   topiramate (TOPAMAX) 25 MG tablet   glucose blood test strip     Shonna Chock, PA-C Surgical Short Stay/Anesthesiology [facility] Phone [phone] [facility] Phone [phone] 02/10/2024 4:49 PM

## 2024-02-10 NOTE — Telephone Encounter (Signed)
 Left message to call back

## 2024-02-11 ENCOUNTER — Inpatient Hospital Stay (HOSPITAL_COMMUNITY): Payer: Self-pay | Admitting: Certified Registered Nurse Anesthetist

## 2024-02-11 ENCOUNTER — Inpatient Hospital Stay (HOSPITAL_COMMUNITY)

## 2024-02-11 ENCOUNTER — Inpatient Hospital Stay (HOSPITAL_COMMUNITY)
Admission: RE | Admit: 2024-02-11 | Discharge: 2024-02-11 | DRG: 274 | Disposition: A | Discharge status: Home / Self Care | Payer: Medicare Other | Attending: Cardiology | Admitting: Cardiology

## 2024-02-11 ENCOUNTER — Encounter (HOSPITAL_COMMUNITY)
Admission: RE | Disposition: A | Discharge status: Home / Self Care | Payer: Self-pay | Source: Home / Self Care | Attending: Cardiology

## 2024-02-11 ENCOUNTER — Encounter (HOSPITAL_COMMUNITY): Payer: Self-pay | Admitting: Cardiology

## 2024-02-11 DIAGNOSIS — I130 Hypertensive heart and chronic kidney disease with heart failure and stage 1 through stage 4 chronic kidney disease, or unspecified chronic kidney disease: Secondary | ICD-10-CM | POA: Diagnosis present

## 2024-02-11 DIAGNOSIS — G4733 Obstructive sleep apnea (adult) (pediatric): Secondary | ICD-10-CM | POA: Diagnosis present

## 2024-02-11 DIAGNOSIS — I2510 Atherosclerotic heart disease of native coronary artery without angina pectoris: Secondary | ICD-10-CM | POA: Diagnosis present

## 2024-02-11 DIAGNOSIS — Z905 Acquired absence of kidney: Secondary | ICD-10-CM

## 2024-02-11 DIAGNOSIS — Z01818 Encounter for other preprocedural examination: Secondary | ICD-10-CM | POA: Diagnosis not present

## 2024-02-11 DIAGNOSIS — M1990 Unspecified osteoarthritis, unspecified site: Secondary | ICD-10-CM | POA: Diagnosis present

## 2024-02-11 DIAGNOSIS — Z85828 Personal history of other malignant neoplasm of skin: Secondary | ICD-10-CM | POA: Diagnosis not present

## 2024-02-11 DIAGNOSIS — Z95818 Presence of other cardiac implants and grafts: Secondary | ICD-10-CM | POA: Diagnosis not present

## 2024-02-11 DIAGNOSIS — I5022 Chronic systolic (congestive) heart failure: Secondary | ICD-10-CM

## 2024-02-11 DIAGNOSIS — I480 Paroxysmal atrial fibrillation: Secondary | ICD-10-CM | POA: Diagnosis not present

## 2024-02-11 DIAGNOSIS — Z006 Encounter for examination for normal comparison and control in clinical research program: Secondary | ICD-10-CM

## 2024-02-11 DIAGNOSIS — E782 Mixed hyperlipidemia: Secondary | ICD-10-CM | POA: Diagnosis present

## 2024-02-11 DIAGNOSIS — Z7901 Long term (current) use of anticoagulants: Secondary | ICD-10-CM | POA: Diagnosis not present

## 2024-02-11 DIAGNOSIS — Z85528 Personal history of other malignant neoplasm of kidney: Secondary | ICD-10-CM

## 2024-02-11 DIAGNOSIS — N189 Chronic kidney disease, unspecified: Secondary | ICD-10-CM | POA: Diagnosis present

## 2024-02-11 DIAGNOSIS — I4819 Other persistent atrial fibrillation: Secondary | ICD-10-CM | POA: Diagnosis present

## 2024-02-11 DIAGNOSIS — I110 Hypertensive heart disease with heart failure: Secondary | ICD-10-CM | POA: Diagnosis not present

## 2024-02-11 DIAGNOSIS — Z87891 Personal history of nicotine dependence: Secondary | ICD-10-CM

## 2024-02-11 DIAGNOSIS — E1122 Type 2 diabetes mellitus with diabetic chronic kidney disease: Secondary | ICD-10-CM | POA: Diagnosis present

## 2024-02-11 DIAGNOSIS — R791 Abnormal coagulation profile: Secondary | ICD-10-CM

## 2024-02-11 DIAGNOSIS — Z9884 Bariatric surgery status: Secondary | ICD-10-CM | POA: Diagnosis not present

## 2024-02-11 HISTORY — PX: LEFT ATRIAL APPENDAGE OCCLUSION: EP1229

## 2024-02-11 HISTORY — PX: TRANSESOPHAGEAL ECHOCARDIOGRAM (CATH LAB): EP1270

## 2024-02-11 HISTORY — DX: Presence of other cardiac implants and grafts: Z95.818

## 2024-02-11 LAB — BASIC METABOLIC PANEL
Anion gap: 9 (ref 5–15)
BUN: 26 mg/dL — ABNORMAL HIGH (ref 8–23)
CO2: 24 mmol/L (ref 22–32)
Calcium: 9.4 mg/dL (ref 8.9–10.3)
Chloride: 105 mmol/L (ref 98–111)
Creatinine, Ser: 1.47 mg/dL — ABNORMAL HIGH (ref 0.61–1.24)
GFR, Estimated: 52 mL/min — ABNORMAL LOW (ref >60)
Glucose, Bld: 132 mg/dL — ABNORMAL HIGH (ref 70–99)
Potassium: 4.3 mmol/L (ref 3.5–5.1)
Sodium: 138 mmol/L (ref 135–145)

## 2024-02-11 LAB — SURGICAL PCR SCREEN
MRSA, PCR: POSITIVE — AB
Staphylococcus aureus: POSITIVE — AB

## 2024-02-11 LAB — CBC
HCT: 41.9 % (ref 39.0–52.0)
Hemoglobin: 13.7 g/dL (ref 13.0–17.0)
MCH: 29.5 pg (ref 26.0–34.0)
MCHC: 32.7 g/dL (ref 30.0–36.0)
MCV: 90.3 fL (ref 80.0–100.0)
Platelets: 240 10*3/uL (ref 150–400)
RBC: 4.64 MIL/uL (ref 4.22–5.81)
RDW: 14.1 % (ref 11.5–15.5)
WBC: 8.7 10*3/uL (ref 4.0–10.5)
nRBC: 0 % (ref 0.0–0.2)

## 2024-02-11 LAB — ECHO TEE
AV Mean grad: 5 mmHg
AV Peak grad: 8.6 mmHg
Ao pk vel: 1.47 m/s

## 2024-02-11 LAB — GLUCOSE, CAPILLARY
Glucose-Capillary: 113 mg/dL — ABNORMAL HIGH (ref 70–99)
Glucose-Capillary: 120 mg/dL — ABNORMAL HIGH (ref 70–99)

## 2024-02-11 LAB — TYPE AND SCREEN
ABO/RH(D): O NEG
Antibody Screen: NEGATIVE

## 2024-02-11 LAB — POCT ACTIVATED CLOTTING TIME: Activated Clotting Time: 417 s

## 2024-02-11 SURGERY — LEFT ATRIAL APPENDAGE OCCLUSION
Anesthesia: General

## 2024-02-11 MED ORDER — VANCOMYCIN HCL IN DEXTROSE 1-5 GM/200ML-% IV SOLN
1000.0000 mg | INTRAVENOUS | Status: AC
  Administered 2024-02-11: 1000 mg via INTRAVENOUS
  Filled 2024-02-11: qty 200

## 2024-02-11 MED ORDER — FENTANYL CITRATE (PF) 100 MCG/2ML IJ SOLN
INTRAMUSCULAR | Status: AC
  Filled 2024-02-11: qty 2

## 2024-02-11 MED ORDER — HYDROCODONE-ACETAMINOPHEN 10-325 MG PO TABS
1.0000 | ORAL_TABLET | Freq: Four times a day (QID) | ORAL | Status: DC | PRN
  Administered 2024-02-11: 1 via ORAL
  Filled 2024-02-11 (×2): qty 1

## 2024-02-11 MED ORDER — CHLORHEXIDINE GLUCONATE 0.12 % MT SOLN
OROMUCOSAL | Status: AC
  Administered 2024-02-11: 15 mL
  Filled 2024-02-11: qty 15

## 2024-02-11 MED ORDER — ONDANSETRON HCL 4 MG/2ML IJ SOLN
INTRAMUSCULAR | Status: DC | PRN
  Administered 2024-02-11: 4 mg via INTRAVENOUS

## 2024-02-11 MED ORDER — ROCURONIUM BROMIDE 10 MG/ML (PF) SYRINGE
PREFILLED_SYRINGE | INTRAVENOUS | Status: DC | PRN
  Administered 2024-02-11 (×2): 10 mg via INTRAVENOUS
  Administered 2024-02-11: 50 mg via INTRAVENOUS

## 2024-02-11 MED ORDER — SODIUM CHLORIDE 0.9 % IV SOLN
INTRAVENOUS | Status: DC
Start: 2024-02-11 — End: 2024-02-11

## 2024-02-11 MED ORDER — PROPOFOL 10 MG/ML IV BOLUS
INTRAVENOUS | Status: DC | PRN
  Administered 2024-02-11: 150 mg via INTRAVENOUS

## 2024-02-11 MED ORDER — FENTANYL CITRATE (PF) 250 MCG/5ML IJ SOLN
INTRAMUSCULAR | Status: DC | PRN
  Administered 2024-02-11 (×2): 50 ug via INTRAVENOUS

## 2024-02-11 MED ORDER — SODIUM CHLORIDE 0.9 % IV SOLN: 250.0000 mL | INTRAVENOUS | Status: DC | PRN

## 2024-02-11 MED ORDER — INSULIN ASPART 100 UNIT/ML IJ SOLN: 0.0000 [IU] | INTRAMUSCULAR | Status: DC | PRN

## 2024-02-11 MED ORDER — ACETAMINOPHEN 325 MG PO TABS: 650.0000 mg | ORAL_TABLET | ORAL | Status: DC | PRN

## 2024-02-11 MED ORDER — PROTAMINE SULFATE 10 MG/ML IV SOLN
INTRAVENOUS | Status: DC | PRN
  Administered 2024-02-11: 35 mg via INTRAVENOUS

## 2024-02-11 MED ORDER — LACTATED RINGERS IV SOLN: INTRAVENOUS | Status: DC

## 2024-02-11 MED ORDER — CHLORHEXIDINE GLUCONATE 4 % EX SOLN: Freq: Once | CUTANEOUS | Status: DC

## 2024-02-11 MED ORDER — LIDOCAINE 2% (20 MG/ML) 5 ML SYRINGE
INTRAMUSCULAR | Status: DC | PRN
  Administered 2024-02-11: 100 mg via INTRAVENOUS

## 2024-02-11 MED ORDER — ONDANSETRON HCL 4 MG/2ML IJ SOLN: 4.0000 mg | Freq: Four times a day (QID) | INTRAMUSCULAR | Status: DC | PRN

## 2024-02-11 MED ORDER — ACETAMINOPHEN 500 MG PO TABS
ORAL_TABLET | ORAL | Status: AC
  Filled 2024-02-11: qty 2

## 2024-02-11 MED ORDER — APIXABAN 5 MG PO TABS
5.0000 mg | ORAL_TABLET | Freq: Once | ORAL | Status: AC
  Administered 2024-02-11: 5 mg via ORAL
  Filled 2024-02-11: qty 1

## 2024-02-11 MED ORDER — IOHEXOL 350 MG/ML SOLN
INTRAVENOUS | Status: DC | PRN
  Administered 2024-02-11: 20 mL

## 2024-02-11 MED ORDER — SUGAMMADEX SODIUM 200 MG/2ML IV SOLN
INTRAVENOUS | Status: DC | PRN
  Administered 2024-02-11: 206 mg via INTRAVENOUS

## 2024-02-11 MED ORDER — SODIUM CHLORIDE 0.9 % IV SOLN: INTRAVENOUS | Status: DC

## 2024-02-11 MED ORDER — HEPARIN SODIUM (PORCINE) 1000 UNIT/ML IJ SOLN
INTRAMUSCULAR | Status: DC | PRN
  Administered 2024-02-11: 15000 [IU] via INTRAVENOUS

## 2024-02-11 MED ORDER — PHENYLEPHRINE 80 MCG/ML (10ML) SYRINGE FOR IV PUSH (FOR BLOOD PRESSURE SUPPORT)
PREFILLED_SYRINGE | INTRAVENOUS | Status: DC | PRN
  Administered 2024-02-11 (×2): 80 ug via INTRAVENOUS

## 2024-02-11 MED ORDER — SODIUM CHLORIDE 0.9% FLUSH: 3.0000 mL | INTRAVENOUS | Status: DC | PRN

## 2024-02-11 MED ORDER — HEPARIN (PORCINE) IN NACL 2000-0.9 UNIT/L-% IV SOLN
INTRAVENOUS | Status: DC | PRN
  Administered 2024-02-11: 1000 mL

## 2024-02-11 MED ORDER — SODIUM CHLORIDE 0.9% FLUSH: 3.0000 mL | Freq: Two times a day (BID) | INTRAVENOUS | Status: DC

## 2024-02-11 SURGICAL SUPPLY — 3 items
SYS WATCHMAN FXD DBL (SHEATH) ×1 IMPLANT
WATCHMAN FLX PRO PROCEDURE (KITS) ×1 IMPLANT
WATCHMAN FXD CRV SYS PROCEDURE (KITS) ×1 IMPLANT

## 2024-02-11 NOTE — Telephone Encounter (Signed)
 Late entry from 02/10/2024 at 1700:  Confirmed procedure date of 02/11/2024. Confirmed arrival time of 0530 for procedure time at 0730. Reviewed pre-procedure instructions with patient. The patient understands to call if questions/concerns arise prior to procedure.

## 2024-02-11 NOTE — Anesthesia Procedure Notes (Signed)
 Procedure Name: Intubation Date/Time: 02/11/2024 7:53 AM  Performed by: Cy Blamer, CRNAPre-anesthesia Checklist: Patient identified, Emergency Drugs available, Suction available and Patient being monitored Patient Re-evaluated:Patient Re-evaluated prior to induction Oxygen Delivery Method: Circle system utilized Preoxygenation: Pre-oxygenation with 100% oxygen Induction Type: IV induction Ventilation: Mask ventilation without difficulty Laryngoscope Size: Miller and 2 Grade View: Grade I Tube type: Oral Tube size: 7.5 mm Number of attempts: 1 Airway Equipment and Method: Stylet Placement Confirmation: ETT inserted through vocal cords under direct vision, positive ETCO2 and breath sounds checked- equal and bilateral Secured at: 22 cm Tube secured with: Tape Dental Injury: Teeth and Oropharynx as per pre-operative assessment

## 2024-02-11 NOTE — H&P (Signed)
 Electrophysiology Note:   Date:  02/11/24  ID:  Casey [name]., DOB [birth year], MRN [id number]   Primary Cardiologist: Lesleigh Noe, MD (Inactive) Primary Heart Failure: None Electrophysiologist: Nobie Putnam, MD       History of Present Illness:   Casey [name]. is a 68 y.o. male with h/o obesity s/p gastric sleeve, HTN, DM, chronic systolic heart failure with recovered LVEF, and persistent AF on Tikosyn who is being  seen today for evaluation for Watchman device implant at the request of Dr. Izora Ribas.   Patient has a longstanding history of atrial fibrillation.  He is now on Tikosyn.  He had at one point been on Eliquis but due to changes in his insurance this became cost prohibitive, estimating $600 per month.  He was then changed to warfarin.  Unfortunately, he has had a very difficult time maintaining a therapeutic INR.  He has worked closely with our Coumadin clinic and they have been able to come up with no cause for his fluctuations in INR.  For this reason, the patient is interested in [place] device implantation.  He has done some research on his own and discussed with family members and friends regarding the device.  He is otherwise doing relatively well and has no new or acute complaints today.   Interval: Patient presents today for scheduled Watchman implant. Reports doing relatively well. No new or acute complaints. Taking Eliquis. Last dose yesterday evening.   Review of systems complete and found to be negative unless listed in HPI.    EP Information / Studies Reviewed:       EKG Interpretation Date/Time:                  Friday December 11 2023 12:01:08 EST Ventricular Rate:         52 PR Interval:                 178 QRS Duration:             94 QT Interval:                 444 QTC Calculation:412 R Axis:                         10   Text Interpretation:Sinus bradycardia Cannot rule out Anterior infarct (cited on or before 07-Oct-2022) When  compared with ECG of 12-Oct-2023 11:26, No significant change was found Confirmed by Nobie Putnam [id number]) on 12/11/2023 1:07:21 PM    Echo 03/07/21:  1. Left ventricular ejection fraction, by estimation, is 60 to 65%. The  left ventricle has normal function. The left ventricle has no regional  wall motion abnormalities. Left ventricular diastolic parameters are  consistent with Grade I diastolic  dysfunction (impaired relaxation). Elevated left ventricular end-diastolic  pressure.   2. Right ventricular systolic function is normal. The right ventricular  size is normal.   3. The mitral valve is normal in structure. Mild mitral valve  regurgitation. No evidence of mitral stenosis.   4. The aortic valve is calcified. There is mild calcification of the  aortic valve. There is mild thickening of the aortic valve. Aortic valve  regurgitation is moderate. No aortic stenosis is present.   5. Aortic dilatation noted. There is moderate dilatation of the ascending  aorta, measuring 46 mm.   6. The inferior vena cava is normal in size with greater than 50%  respiratory variability,  suggesting right atrial pressure of 3 mmHg.    Risk Assessment/Calculations:     CHA2DS2-VASc Score = 5   This indicates a 7.2% annual risk of stroke. The patient's score is based upon: CHF History: 1 HTN History: 1 Diabetes History: 1 Stroke History: 0 Vascular Disease History: 1 Age Score: 1 Gender Score: 0               Physical Exam:    Today's Vitals   02/11/24 0555 02/11/24 0639  BP: 133/72   Pulse: 61   Resp: 18   Temp: 97.9 F (36.6 C)   SpO2: 97%   Weight: 103 kg   Height: 5\' 10"  (1.778 m)   PainSc:  4    Body mass index is 32.57 kg/m.  GEN: Well nourished, well developed in no acute distress NECK: No JVD CARDIAC: Bradycardic and regular RESPIRATORY:  Clear to auscultation without rales, wheezing or rhonchi  ABDOMEN: Soft, non-tender, non-distended EXTREMITIES:  No edema; No  deformity    ASSESSMENT AND PLAN:   [name] is a 68 y.o. male with h/o obesity s/p gastric sleeve, HTN, DM, chronic systolic heart failure with recovered LVEF, and persistent AF on Tikosyn who is being  seen today for evaluation for Watchman device implant at the request of Dr. Izora Ribas.   I have seen Casey [name]. in the office today who is being considered for a Watchman left atrial appendage closure device. I believe they will benefit from this procedure given their history of atrial fibrillation, CHA2DS2-VASc score of 5 and unadjusted ischemic stroke rate of 7.2% per year. Unfortunately, the patient is not felt to be a long term anticoagulation candidate secondary to inability to achieve stable consistent therapeutic INRs and inability to afford DOAC long-term.  He has also had GI bleeding in the past.  The patient's chart has been reviewed and I feel that they would be a candidate for short term oral anticoagulation after Watchman implant.    It is my belief that after undergoing a LAA closure procedure, Casey [name]. will not need long term anticoagulation which eliminates anticoagulation side effects and major bleeding risk.    Procedural risks for the Watchman implant have been reviewed with the patient including a 0.5% risk of stroke, <1% risk of perforation and <1% risk of device embolization. Other risks include bleeding, vascular damage, tamponade, worsening renal function, and death. The patient understands these risk and wishes to proceed.     The published clinical data on the safety and effectiveness of WATCHMAN include but are not limited to the following: - Holmes DR, Everlene Farrier, Sick P et al. for the PROTECT AF Investigators. Percutaneous closure of the left atrial appendage versus warfarin therapy for prevention of stroke in patients with atrial fibrillation: a randomised non-inferiority trial. Lancet 2009; 374: 534-42. Everlene Farrier, Doshi SK, Isa Rankin D et al. on behalf of the PROTECT AF Investigators. Percutaneous Left Atrial Appendage Closure for Stroke Prophylaxis in Patients With Atrial Fibrillation 2.3-Year Follow-up of the PROTECT AF (Watchman Left Atrial Appendage System for Embolic Protection in Patients With Atrial Fibrillation) Trial. Circulation 2013; 127:720-729. - Alli O, Doshi S,  Kar S, Reddy VY, Sievert H et al. Quality of Life Assessment in the Randomized PROTECT AF (Percutaneous Closure of the Left Atrial Appendage Versus Warfarin Therapy for Prevention of Stroke in Patients With Atrial Fibrillation) Trial of Patients at Risk for Stroke With Nonvalvular Atrial Fibrillation. J Am  Coll Cardiol 2013; 61:1790-8. Aline August DR, Mia [place], Price M, Whisenant B, Sievert H, Doshi S, Huber K, Reddy V. Prospective randomized evaluation of the Watchman left atrial appendage Device in patients with atrial fibrillation versus long-term warfarin therapy; the PREVAIL trial. Journal of the [employer] of Cardiology, Vol. 4, No. 1, 2014, 1-11. - Kar S, Doshi SK, Sadhu A, Horton R, Osorio J et al. Primary outcome evaluation of a next-generation left atrial appendage closure device: results from the PINNACLE FLX trial. Circulation 2021;143(18)1754-1762.     HAS-BLED score 4 Hypertension Yes  Abnormal renal and liver function (Dialysis, transplant, Cr >2.26 mg/dL /Cirrhosis or Bilirubin >2x Normal or AST/ALT/AP >3x Normal) No  Stroke No  Bleeding Yes  Labile INR (Unstable/high INR) Yes  Elderly (>65) Yes  Drugs or alcohol (>= 8 drinks/week, anti-plt or NSAID) No    CHA2DS2-VASc Score = 5  The patient's score is based upon: CHF History: 1 HTN History: 1 Diabetes History: 1 Stroke History: 0 Vascular Disease History: 1 Age Score: 1 Gender Score: 0         ASSESSMENT AND PLAN: Persistent Atrial Fibrillation (ICD10:  I48.19) The patient's CHA2DS2-VASc score is 5, indicating a 7.2% annual risk of stroke.   Continue dofetilide and  follow-up with the atrial fibrillation clinic.   Secondary Hypercoagulable State (ICD10:  D68.69) The patient is at significant risk for stroke/thromboembolism based upon his CHA2DS2-VASc Score of 5.    Proceed with Watchman implant today as scheduled. Patient will tentatively plan to remain on Eliquis for 45 days then transition to DAPT. Repeat CT at 60 days.     Signed, Nobie Putnam, MD

## 2024-02-11 NOTE — Discharge Summary (Addendum)
 Electrophysiology Discharge Summary   Patient ID: Casey Peyton.,  MRN: [id number], DOB/AGE: 67-Mar-1957 67 y.o.  Admit date: 02/11/2024 Discharge date: 02/11/2024  Primary Care Physician: Joycelyn Rua, MD  Primary Cardiologist: Christell Constant, MD  Electrophysiologist: Nobie Putnam, MD     Primary Discharge Diagnosis:  Persistent Atrial Fibrillation Poor candidacy for long term anticoagulation due to h/o GI bleeding, & cost prohibitive.   Secondary Discharge Diagnosis:  Hypertension Chronic Systolic Heart Failure Obesity s/p Gastric Sleeve DM II   Procedures This Admission:  Transeptal Puncture Intra-procedural TEE which showed no LAA thrombus or pericardial effusion Left atrial appendage occlusive device placement on 02/11/24 by Dr. Jimmey Ralph.   This study demonstrated: Successful implantation of a 31mm WATCHMAN FLX PRO left atrial appendage occlusive device   . 2.   TEE demonstrating no LAA thrombus. 3.   No early apparent complications.   Brief HPI: Casey Schinke. is a 68 y.o. male with a history of Persistent Atrial Fibrillation who was referred to Electrophysiology in the outpatient setting    Hospital Course:  The patient was admitted and underwent left atrial appendage occlusive device placement as above.  The patient was monitored in the post procedure setting and has done very well with no concerns. Given this, he/she is being considered for same day discharge later today. Groin site has been stable without evidence of hematoma or bleeding. Wound care and restrictions were reviewed with the patient.   The patient has been scheduled for post procedure follow up with Dr. Jimmey Ralph in approximately 6 weeks. They will restart Eliquis this evening and continue for 45 days then stop. At that time he will transition to Plavix 75mg  + ASA 81mg  daily to complete 6 months of therapy then ASA alone.  They will require dental SBE for 6 month post op and should  refrain from dental work or cleanings for the first 45 days post implant.  He has a known cavity that will need to be filled and have discussed delaying until he has completed his eliquis. SBE to be RX'd at follow up.   A repeat CT scan will be performed in approximately 60 days to ensure proper seal of the device.    Physical Exam: Vitals:   02/11/24 1015 02/11/24 1030 02/11/24 1045 02/11/24 1100  BP: 121/65 117/60 115/64 122/73  Pulse: (!) 47 60 (!) 45 (!) 53  Resp: 12 20 14 14   Temp:      TempSrc:      SpO2: 95% 95% 96% 95%  Weight:      Height:        GEN: pleasant adult male, well nourished, well developed in no acute distress NECK: No JVD; No carotid bruits CARDIAC: Regular rate and rhythm, no murmurs, rubs, gallops RESPIRATORY:  Clear to auscultation without rales, wheezing or rhonchi  ABDOMEN: Soft, non-tender, non-distended EXTREMITIES:  No edema; No deformity. Groin site Stable     Discharge Medications:  Allergies as of 02/11/2024       Reactions   Ace Inhibitors Shortness Of Breath, Other (See Comments)   Possible stomach pains, chest pains    Bifidobacterium Other (See Comments)   Other Reaction(s): Abdominal Pain, GI Upset (intolerance), stomach upset   Penicillins Hives   Has patient had a PCN reaction causing immediate rash, facial/tongue/throat swelling, SOB or lightheadedness with hypotension: Unknown Has patient had a PCN reaction causing severe rash involving mucus membranes or skin necrosis: Unknown Has patient had a PCN  reaction that required hospitalization: No Has patient had a PCN reaction occurring within the last 10 years: No Childhood reaction. If all of the above answers are "NO", then may proceed with Cephalosporin use.   Percocet [oxycodone-acetaminophen] Anxiety, Other (See Comments)   Hyperactivity & unhinged. Previously tolerated hydrocodone   Align Prebiotic-probiotic Other (See Comments)   Does not remember reaction    Bactrim  [sulfamethoxazole-trimethoprim] Other (See Comments)   Does not remember reaction    Gabapentin Other (See Comments)   Does not remember reaction    Lopressor [metoprolol] Other (See Comments)   Drops heart rate- caused Ed visit   Prevacid [lansoprazole] Other (See Comments)   Busted lips open    Simvastatin Other (See Comments)   Leg pain, aches   Vioxx [rofecoxib] Other (See Comments)   Possible stomach bleed    Zoloft [sertraline] Other (See Comments)   Did not like how he felt    Codeine Anxiety   Oxycodone Anxiety        Medication List     TAKE these medications    amLODipine 2.5 MG tablet Commonly known as: NORVASC Take 2.5 mg by mouth daily.   apixaban 5 MG Tabs tablet Commonly known as: Eliquis Take 1 tablet (5 mg total) by mouth 2 (two) times daily.   atorvastatin 10 MG tablet Commonly known as: LIPITOR Take 10 mg by mouth at bedtime.   carvedilol 6.25 MG tablet Commonly known as: COREG TAKE 1 TABLET BY MOUTH 2 TIMES DAILY WITH A MEAL.   dofetilide 500 MCG capsule Commonly known as: TIKOSYN Take 1 capsule (500 mcg total) by mouth 2 (two) times daily.   DSS 100 MG Caps Take 2 capsules by mouth daily as needed (constipation).   Entresto 49-51 MG Generic drug: sacubitril-valsartan TAKE 1 TABLET BY MOUTH TWICE A DAY   ezetimibe 10 MG tablet Commonly known as: ZETIA Take 10 mg by mouth daily.   glimepiride 2 MG tablet Commonly known as: AMARYL Take 2 mg by mouth daily with breakfast.   glucose blood test strip USE TO TEST BLOOD SUGAR THREE TIMES A DAY   HYDROcodone-acetaminophen 10-325 MG tablet Commonly known as: NORCO Take 1 tablet by mouth every 6 (six) hours as needed for severe pain (pain score 7-10).   magnesium gluconate 500 MG tablet Commonly known as: MAGONATE Take 500 mg by mouth 2 (two) times daily.   metFORMIN 500 MG 24 hr tablet Commonly known as: GLUCOPHAGE-XR Take 1,000 mg by mouth 2 (two) times daily with a meal.   ONE  DAILY MULTIVITAMIN ADULT PO Take 1 tablet by mouth daily.   pantoprazole 40 MG tablet Commonly known as: PROTONIX Take 40 mg by mouth every morning.   PRESERVISION AREDS PO Take 1 capsule by mouth 2 (two) times daily.   spironolactone 25 MG tablet Commonly known as: ALDACTONE Take 0.5 tablets (12.5 mg total) by mouth 2 (two) times daily.   topiramate 25 MG tablet Commonly known as: TOPAMAX Take 25 mg by mouth 2 (two) times daily.        Disposition: Home. Discharge Instructions     Call MD for:  redness, tenderness, or signs of infection (pain, swelling, redness, odor or green/yellow discharge around incision site)   Complete by: As directed    Call MD for:  severe uncontrolled pain   Complete by: As directed    Call MD for:  temperature >100.4   Complete by: As directed    Diet - low sodium  heart healthy   Complete by: As directed    Increase activity slowly   Complete by: As directed        Signed, Canary Brim, NP-C, AGACNP-BC  [facility] - Electrophysiology  02/11/2024, 1:47 PM  I have seen, examined the patient, and reviewed the above assessment and plan.    Hospital Course: Casey Henery. is a 68 y.o. male with h/o obesity s/p gastric sleeve, HTN, DM, chronic systolic heart failure with recovered LVEF, and persistent AF on Tikosyn who presented today for scheduled Watchman device implant due to inability to maintain therapeutic INRs on warfarin and inability to afford DOACs. He underwent successful implant of a 31mm Watchman Flx Pro device. He underwent usual post-procedural bedrest and monitoring without any obvious acute complications and was discharged home.   GEN: No acute distress.   Cardiac: Bradycardic, regular Resp: Normal work of breathing R groin: Soft, no hematoma or bleeding Ext: No edema Neuro: No gross focal deficits Psych: Normal affect   Plan:  -Continue Eliquis for 45 days then transition to Plavix 75mg  once daily and  aspirin 81mg  once daily.  -Repeat CT scan to assess for device leak or thrombus in ~60 days. -Usual discharge wound care and activity restrictions provided.   Duration of Discharge Encounter: 45 minutes   MD Time: 25 minutes  Nobie Putnam, MD 02/11/2024 9:38 PM

## 2024-02-11 NOTE — Anesthesia Postprocedure Evaluation (Signed)
 Anesthesia Post Note  Patient: Devesh Monforte.  Procedure(s) Performed: LEFT ATRIAL APPENDAGE OCCLUSION TRANSESOPHAGEAL ECHOCARDIOGRAM     Patient location during evaluation: PACU Anesthesia Type: General Level of consciousness: awake and alert Pain management: pain level controlled Vital Signs Assessment: post-procedure vital signs reviewed and stable Respiratory status: spontaneous breathing, nonlabored ventilation, respiratory function stable and patient connected to nasal cannula oxygen Cardiovascular status: blood pressure returned to baseline and stable Postop Assessment: no apparent nausea or vomiting Anesthetic complications: no  There were no known notable events for this encounter.  Last Vitals:  Vitals:   02/11/24 1045 02/11/24 1100  BP: 115/64 122/73  Pulse: (!) 45 (!) 53  Resp: 14 14  Temp:    SpO2: 96% 95%    Last Pain:  Vitals:   02/11/24 1100  TempSrc:   PainSc: 0-No pain                 Kinsley Nicklaus L Bonnie Roig

## 2024-02-11 NOTE — Transfer of Care (Signed)
 Immediate Anesthesia Transfer of Care Note  Patient: Casey [name].  Procedure(s) Performed: LEFT ATRIAL APPENDAGE OCCLUSION TRANSESOPHAGEAL ECHOCARDIOGRAM  Patient Location: Cath Lab  Anesthesia Type:General  Level of Consciousness: awake, alert , and oriented  Airway & Oxygen Therapy: Patient Spontanous Breathing and Patient connected to face mask oxygen  Post-op Assessment: Report given to RN, Post -op Vital signs reviewed and stable, Patient moving all extremities X 4, and Patient able to stick tongue midline  Post vital signs: Reviewed and stable  Last Vitals:  Vitals Value Taken Time  BP 132/98 02/11/24 0919  Temp 98.6   Pulse 50 02/11/24 0921  Resp 13 02/11/24 0921  SpO2 99 % 02/11/24 0921  Vitals shown include unfiled device data.  Last Pain:  Vitals:   02/11/24 0639  PainSc: 4       Patients Stated Pain Goal: 2 (02/11/24 1610)  Complications: There were no known notable events for this encounter.

## 2024-02-11 NOTE — Progress Notes (Signed)
 Pt arrived from ...cath.., A/ox 4...pt denies any pain, MD aware,CCMD called. CHG bath given,no further needs at this time

## 2024-02-11 NOTE — Discharge Instructions (Signed)
 WATCHMANT Procedure, Care After  Procedure MD: Dr. Alene Mires Clinical Coordinator: Karsten Fells, RN  This sheet gives you information about how to care for yourself after your procedure. Your health care provider may also give you more specific instructions. If you have problems or questions, contact your health care provider.  What can I expect after the procedure? After the procedure, it is common to have: Bruising around your puncture site. Tenderness around your puncture site. Tiredness (fatigue).  Medication instructions It is very important to continue to take your blood thinner as directed by your doctor after the Watchman procedure. Call your procedure doctor's office with question or concerns. Please follow your medication instructions on your discharge summary. Only take the medications listed on your discharge paperwork.  Follow up You will be seen in 6 weeks after your procedure You will have a repeat CT scan or Echocardiogram approximately 8 weeks after your procedure mark to check your device You will follow up the MD/APP who performed your procedure 6 months after your procedure The Watchman Clinical Coordinator will check in with you from time to time, including 1 and 2 years after your procedure.  NO DENTAL CLEANINGS FOR 45 days. After that, you will require antibiotics for dental procedures the first 6 months.   Follow these instructions at home: Puncture site care  Follow instructions from your health care provider about how to take care of your puncture site. Make sure you: If present, leave stitches (sutures), skin glue, or adhesive strips in place.  If a large square bandage is present, this may be removed 24 hours after surgery.  Check your puncture site every day for signs of infection. Check for: Redness, swelling, or pain. Fluid or blood. If your puncture site starts to bleed, lie down on your back, apply firm pressure to the area, and contact your health  care provider. Warmth. Pus or a bad smell. Driving Do not drive yourself home if you received sedation Do not drive for at least 4 days after your procedure or however long your health care provider recommends. (Do not resume driving if you have previously been instructed not to drive for other health reasons.) Do not spend greater than 1 hour at a time in a car for the first 3 days. Stop and take a break with a 5 minute walk at least every hour.  Do not drive or use heavy machinery while taking prescription pain medicine.  Activity Avoid activities that take a lot of effort, including exercise, for at least 7 days after your procedure. For the first 3 days, avoid sitting for longer than one hour at a time.  Avoid alcoholic beverages, signing paperwork, or participating in legal proceedings for 24 hours after receiving sedation Do not lift anything that is heavier than 10 lb (4.5 kg) for one week.  No sexual activity for 1 week.  Return to your normal activities as told by your health care provider. Ask your health care provider what activities are safe for you. General instructions Take over-the-counter and prescription medicines only as told by your health care provider. Do not use any products that contain nicotine or tobacco, such as cigarettes and e-cigarettes. If you need help quitting, ask your health care provider. You may shower after 24 hours, but Do not take baths, swim, or use a hot tub for 1 week.  Do not drink alcohol for 24 hours after your procedure. Keep all follow-up visits as told by your health care provider. This  is important. Dental Work: You will require antibiotics prior to any dental work, including cleanings, for 6 months after your Watchman implantation to help protect you from infection. After 6 months, antibiotics are no longer required. Contact a health care provider if: You have redness, mild swelling, or pain around your puncture site. You have soreness in  your throat or at your puncture site that does not improve after several days You have fluid or blood coming from your puncture site that stops after applying firm pressure to the area. Your puncture site feels warm to the touch. You have pus or a bad smell coming from your puncture site. You have a fever. You have chest pain or discomfort that spreads to your neck, jaw, or arm. You are sweating a lot. You feel nauseous. You have a fast or irregular heartbeat. You have shortness of breath. You are dizzy or light-headed and feel the need to lie down. You have pain or numbness in the arm or leg closest to your puncture site. Get help right away if: Your puncture site suddenly swells. Your puncture site is bleeding and the bleeding does not stop after applying firm pressure to the area. These symptoms may represent a serious problem that is an emergency. Do not wait to see if the symptoms will go away. Get medical help right away. Call your local emergency services (911 in the [place] Do not drive yourself to the hospital. Summary After the procedure, it is normal to have bruising and tenderness at the puncture site in your groin, neck, or forearm. Check your puncture site every day for signs of infection. Get help right away if your puncture site is bleeding and the bleeding does not stop after applying firm pressure to the area. This is a medical emergency.  This information is not intended to replace advice given to you by your health care provider. Make sure you discuss any questions you have with your health care provider.

## 2024-02-11 NOTE — Progress Notes (Signed)
 Patient given discharge instructions. Wife and daughter present. PIVs removed. Telemetry box removed, CCMD notified. Patient taken to vehicle in wheelchair by staff.   Kenard Gower, RN

## 2024-02-12 ENCOUNTER — Encounter (HOSPITAL_COMMUNITY): Payer: Self-pay | Admitting: Cardiology

## 2024-02-15 ENCOUNTER — Telehealth: Payer: Self-pay

## 2024-02-15 DIAGNOSIS — Z95818 Presence of other cardiac implants and grafts: Secondary | ICD-10-CM

## 2024-02-15 DIAGNOSIS — I4819 Other persistent atrial fibrillation: Secondary | ICD-10-CM

## 2024-02-15 NOTE — Telephone Encounter (Signed)
  [facility]   Watch[facility] Team  Contacted the patient regarding discharge from [facility] on 02/11/2024  The patient understands to follow up with Canary Brim on 4/30  The patient understands discharge instructions? Yes  The patient understands medications and regimen? Yes   The patient reports groin site looks healthy with no S/S of bleeding or infection  The patient understands to call with any questions or concerns prior to scheduled visit.

## 2024-02-16 MED FILL — Fentanyl Citrate Preservative Free (PF) Inj 100 MCG/2ML: INTRAMUSCULAR | Qty: 2 | Status: AC

## 2024-02-29 DIAGNOSIS — I10 Essential (primary) hypertension: Secondary | ICD-10-CM | POA: Diagnosis not present

## 2024-02-29 DIAGNOSIS — I4891 Unspecified atrial fibrillation: Secondary | ICD-10-CM | POA: Diagnosis not present

## 2024-02-29 DIAGNOSIS — D62 Acute posthemorrhagic anemia: Secondary | ICD-10-CM | POA: Diagnosis not present

## 2024-02-29 DIAGNOSIS — E7800 Pure hypercholesterolemia, unspecified: Secondary | ICD-10-CM | POA: Diagnosis not present

## 2024-03-28 ENCOUNTER — Ambulatory Visit (HOSPITAL_COMMUNITY): Payer: Medicare Other | Admitting: Internal Medicine

## 2024-03-28 NOTE — Progress Notes (Unsigned)
 Electrophysiology Office Note:   Date:  03/30/2024  ID:  Denna Fish., DOB [birth year], MRN [id number]  Primary Cardiologist: Jann Melody, MD Primary Heart Failure: None Electrophysiologist: Ardeen Kohler, MD      History of Present Illness:   Casey [name]. is a 68 y.o. male with h/o AF s/p Watchman 31mm, HFrEF, HTN, obesity s/p gastric sleeve seen today for routine electrophysiology followup.   Recent hospital visit on 02/11/24 for implantation of Watchman FLX Pro / LAAO device. He was monitored in hospital post-procedure and discharged same day.   Since last being seen in our clinic the patient reports doing well overall. He suspects the Eliquis  has made him gain weight (his wife jokes it more likely the strawberry shortcake). He denies known episodes of AF.  He also reports the eliquis  made him "feel weird" but he can not explain further what he means.   He denies chest pain, palpitations, dyspnea, PND, orthopnea, nausea, vomiting, dizziness, syncope, edema, weight gain, or early satiety.   Review of systems complete and found to be negative unless listed in HPI.   EP Information / Studies Reviewed:    EKG is not ordered today. EKG from 01/29/24 reviewed which showed NSR 64 bpm  EKG Interpretation Date/Time:  Wednesday March 30 2024 10:28:48 EDT Ventricular Rate:  56 PR Interval:  186 QRS Duration:  96 QT Interval:  460 QTC Calculation: 443 R Axis:   -4  Text Interpretation: Sinus bradycardia with marked sinus arrhythmia Confirmed by Creighton Doffing ([id number]) on 03/30/2024 10:53:16 AM   Studies:  LHC 07/2018 > moderate three-vessel CAD  ECHO 03/2021 > LVEF 60-65%, G1DD, mild MV regurgitation, mild calcification of the AV with moderate regurgitation  TEE 02/11/24 > LVEF 50-55%, large chicken wing appendage with no thrombus, 31 mm FLX device in place with no large shoulder, no leak by color flow, compression 25%, LA mildly dilated  Arrhythmia / AAD AF s/p  Watchman 02/11/24    Risk Assessment/Calculations:    CHA2DS2-VASc Score = 5   This indicates a 7.2% annual risk of stroke. The patient's score is based upon: CHF History: 1 HTN History: 1 Diabetes History: 1 Stroke History: 0 Vascular Disease History: 1 Age Score: 1 Gender Score: 0             Physical Exam:   VS:  BP 110/60   Pulse (!) 56   Ht 5\' 10"  (1.778 m)   Wt 229 lb (103.9 kg)   SpO2 96%   BMI 32.86 kg/m    Wt Readings from Last 3 Encounters:  03/30/24 229 lb (103.9 kg)  02/11/24 227 lb (103 kg)  01/29/24 227 lb (103 kg)     GEN: Well nourished, well developed in no acute distress NECK: No JVD; No carotid bruits CARDIAC: Regular rate and rhythm, no murmurs, rubs, gallops RESPIRATORY:  Clear to auscultation without rales, wheezing or rhonchi  ABDOMEN: Soft, non-tender, non-distended EXTREMITIES:  No edema; No deformity   ASSESSMENT AND PLAN:    Persistent Atrial Fibrillation  S/p Watchman Device 02/11/24  Poor candidacy for anticoagulation due to prior GIB, 31 mm Watchman FLX Pro  -Eliquis  from implant through 03/27/24 > pt instructed to stop Eliquis . 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 Discussed with Larkin Plumb, RN who will contact him to reschedule app -CT labs > BMP -dental SBE > Cephalexin 2gm 1 hour before dental procedures (has previously taken Ancef and tolerated per chart review). Can not give azithro with Tikosyn .   Hypertension  -well controlled on current regimen     Follow up with Dr. Daneil Dunker or EP APP  as scheduled 08/12/24   Signed, Creighton Doffing, NP-C, AGACNP-BC [facility] - Electrophysiology  03/30/2024, 12:14  PM

## 2024-03-29 ENCOUNTER — Ambulatory Visit (HOSPITAL_COMMUNITY): Payer: Medicare Other | Admitting: Physician Assistant

## 2024-03-30 ENCOUNTER — Ambulatory Visit: Admitting: Pulmonary Disease

## 2024-03-30 ENCOUNTER — Encounter: Payer: Self-pay | Admitting: Pulmonary Disease

## 2024-03-30 VITALS — BP 110/60 | HR 56 | Ht 70.0 in | Wt 229.0 lb

## 2024-03-30 DIAGNOSIS — Z95818 Presence of other cardiac implants and grafts: Secondary | ICD-10-CM

## 2024-03-30 DIAGNOSIS — E7800 Pure hypercholesterolemia, unspecified: Secondary | ICD-10-CM | POA: Diagnosis not present

## 2024-03-30 DIAGNOSIS — I5022 Chronic systolic (congestive) heart failure: Secondary | ICD-10-CM | POA: Diagnosis not present

## 2024-03-30 DIAGNOSIS — I10 Essential (primary) hypertension: Secondary | ICD-10-CM | POA: Diagnosis not present

## 2024-03-30 DIAGNOSIS — D62 Acute posthemorrhagic anemia: Secondary | ICD-10-CM | POA: Diagnosis not present

## 2024-03-30 DIAGNOSIS — D6869 Other thrombophilia: Secondary | ICD-10-CM | POA: Diagnosis not present

## 2024-03-30 DIAGNOSIS — I4819 Other persistent atrial fibrillation: Secondary | ICD-10-CM

## 2024-03-30 DIAGNOSIS — I4891 Unspecified atrial fibrillation: Secondary | ICD-10-CM | POA: Diagnosis not present

## 2024-03-30 MED ORDER — ASPIRIN 81 MG PO TBEC: 81.0000 mg | DELAYED_RELEASE_TABLET | Freq: Every day | ORAL | Status: AC

## 2024-03-30 MED ORDER — CEPHALEXIN 500 MG PO CAPS: 2000.0000 mg | ORAL_CAPSULE | ORAL | 0 refills | Status: DC | PRN

## 2024-03-30 MED ORDER — CLOPIDOGREL BISULFATE 75 MG PO TABS: 75.0000 mg | ORAL_TABLET | Freq: Every day | ORAL | 2 refills | Status: DC

## 2024-03-30 NOTE — Addendum Note (Signed)
 Addended by: Elaura Calix A on: 03/30/2024 08:25 AM   Modules accepted: Orders

## 2024-03-30 NOTE — Patient Instructions (Addendum)
 Medication Instructions:  Stop eliquis  Start plavix 75 mg daily Start over the counter Aspirin  81 mg daily Start cephalexin 2000 mg as needed 1 hour prior to dental visits *If you need a refill on your cardiac medications before your next appointment, please call your pharmacy*  Lab Work: BMET-TODAY If you have labs (blood work) drawn today and your tests are completely normal, you will receive your results only by: MyChart Message (if you have MyChart) OR A paper copy in the mail If you have any lab test that is abnormal or we need to change your treatment, we will call you to review the results.  Follow-Up: At [facility], you and your health needs are our priority.  As part of our continuing mission to provide you with exceptional heart care, our providers are all part of one team.  This team includes your primary Cardiologist (physician) and Advanced Practice Providers or APPs (Physician Assistants and Nurse Practitioners) who all work together to provide you with the care you need, when you need it.  Your next appointment:   August 12, 2024 at 10:30 AM with Casey [name]

## 2024-03-31 LAB — BASIC METABOLIC PANEL WITH GFR
BUN/Creatinine Ratio: 16 (ref 10–24)
BUN: 24 mg/dL (ref 8–27)
CO2: 22 mmol/L (ref 20–29)
Calcium: 9.2 mg/dL (ref 8.6–10.2)
Chloride: 105 mmol/L (ref 96–106)
Creatinine, Ser: 1.47 mg/dL — ABNORMAL HIGH (ref 0.76–1.27)
Glucose: 105 mg/dL — ABNORMAL HIGH (ref 70–99)
Potassium: 5 mmol/L (ref 3.5–5.2)
Sodium: 139 mmol/L (ref 134–144)
eGFR: 52 mL/min/{1.73_m2} — ABNORMAL LOW (ref >59)

## 2024-04-11 ENCOUNTER — Ambulatory Visit (HOSPITAL_COMMUNITY)
Admission: RE | Admit: 2024-04-11 | Discharge: 2024-04-11 | Disposition: A | Discharge status: Home / Self Care | Source: Ambulatory Visit | Attending: Cardiology | Admitting: Cardiology

## 2024-04-11 DIAGNOSIS — I4891 Unspecified atrial fibrillation: Secondary | ICD-10-CM | POA: Diagnosis not present

## 2024-04-11 DIAGNOSIS — I517 Cardiomegaly: Secondary | ICD-10-CM | POA: Diagnosis not present

## 2024-04-11 DIAGNOSIS — I4819 Other persistent atrial fibrillation: Secondary | ICD-10-CM | POA: Diagnosis not present

## 2024-04-11 DIAGNOSIS — Z95818 Presence of other cardiac implants and grafts: Secondary | ICD-10-CM | POA: Diagnosis not present

## 2024-04-11 MED ORDER — IOHEXOL 350 MG/ML SOLN
100.0000 mL | Freq: Once | INTRAVENOUS | Status: AC | PRN
  Administered 2024-04-11: 100 mL via INTRAVENOUS

## 2024-04-11 NOTE — OR Nursing (Signed)
 Iv removed, pt asymptomatic

## 2024-04-13 ENCOUNTER — Ambulatory Visit (HOSPITAL_COMMUNITY): Payer: Medicare Other | Admitting: Internal Medicine

## 2024-04-14 ENCOUNTER — Ambulatory Visit: Payer: Self-pay

## 2024-04-30 DIAGNOSIS — I10 Essential (primary) hypertension: Secondary | ICD-10-CM | POA: Diagnosis not present

## 2024-04-30 DIAGNOSIS — I4891 Unspecified atrial fibrillation: Secondary | ICD-10-CM | POA: Diagnosis not present

## 2024-04-30 DIAGNOSIS — E7800 Pure hypercholesterolemia, unspecified: Secondary | ICD-10-CM | POA: Diagnosis not present

## 2024-04-30 DIAGNOSIS — D62 Acute posthemorrhagic anemia: Secondary | ICD-10-CM | POA: Diagnosis not present

## 2024-05-09 ENCOUNTER — Other Ambulatory Visit (HOSPITAL_COMMUNITY): Payer: Self-pay | Admitting: Physician Assistant

## 2024-05-09 DIAGNOSIS — Z6832 Body mass index (BMI) 32.0-32.9, adult: Secondary | ICD-10-CM | POA: Diagnosis not present

## 2024-05-09 DIAGNOSIS — Z9889 Other specified postprocedural states: Secondary | ICD-10-CM | POA: Diagnosis not present

## 2024-05-09 DIAGNOSIS — Z8719 Personal history of other diseases of the digestive system: Secondary | ICD-10-CM | POA: Diagnosis not present

## 2024-05-09 DIAGNOSIS — Z9884 Bariatric surgery status: Secondary | ICD-10-CM | POA: Diagnosis not present

## 2024-05-09 DIAGNOSIS — E669 Obesity, unspecified: Secondary | ICD-10-CM | POA: Diagnosis not present

## 2024-05-16 NOTE — Progress Notes (Unsigned)
 OFFICE NOTE:    Date:  05/17/2024  ID:  Casey [name]., DOB 04-14-1956, MRN [id number] PCP: Wyn Heater, MD  [facility] Providers Cardiologist:  Jann Melody, MD Electrophysiologist:  Ardeen Kohler, MD       Patient Profile:  Persistent atrial fibrillation S/p DCCV x 2 AAD: Dofetilide   S/p LAAO (Watchman) 01/2024  [hx of severe GI bleed, DOAC cost prohibitive, labile INRs] HFimpEF (heart failure with improved ejection fraction)  Tachy mediated CM TTE 07/2018: EF 30-35  TTE 03/07/2021: EF 60-65, no RWMA, GR 1 DD, normal RVSF, mild MR, moderate AI, moderate dilation of ascending aorta 46 mm TEE 02/11/2024: EF 50-55, mild LAE, mild MR, moderate AI Coronary artery disease  LHC 07/21/2018: LAD proximal 70; RI 25; LCx mid to distal 100, OM1 75; RCA mid 65, RPAV 95 >> med Rx OSA  Hypertension  Hyperlipidemia  Statin myopathy Chronic kidney disease  S/p partial right nephrectomy Dilated aorta  TTE 03/2021: 46 mm CT 04/11/2024: 43 mm Obesity s/p gastric sleeve  Hx of GI bleed (2023: multiple transfusions w PRBCs)        Discussed the use of AI scribe software for clinical note transcription with the patient, who gave verbal consent to proceed. History of Present Illness Casey [name]. is a 68 y.o. male who returns for follow up of AF, CHF, CAD. He was last seen by Dr. Paulita Boss in 12/2023. He underwent LAAO in 01/2024. He completed course with Eliquis  and is now on DAPT x 6 mos (until 08/2024).  He has not chest discomfort, shortness of breath, syncope or leg swelling is reported.  He has not had any rapid palpitations suggestive of atrial fibrillation.     ROS-See HPI     Studies Reviewed:  EKG Interpretation Date/Time:  Tuesday May 17 2024 10:34:36 EDT Ventricular Rate:  57 PR Interval:  184 QRS Duration:  94 QT Interval:  440 QTC Calculation: 428 R Axis:   -18  Text Interpretation: Sinus bradycardia with sinus arrhythmia When compared  with ECG of 30-Mar-2024 10:28, No significant change was found Confirmed by Marlyse Single [phone]) on 05/17/2024 10:59:07 AM     Results LABS LDL: 66 (01/2024)  Risk Assessment/Calculations:  CHA2DS2-VASc Score = 5   This indicates a 7.2% annual risk of stroke. The patient's score is based upon: CHF History: 1 HTN History: 1 Diabetes History: 1 Stroke History: 0 Vascular Disease History: 1 Age Score: 1 Gender Score: 0           Physical Exam:  VS:  BP 130/78   Pulse 68   Ht 5' 10 (1.778 m)   Wt 227 lb (103 kg)   SpO2 96%   BMI 32.57 kg/m    Wt Readings from Last 3 Encounters:  05/17/24 227 lb (103 kg)  03/30/24 229 lb (103.9 kg)  02/11/24 227 lb (103 kg)    Constitutional:      Appearance: Healthy appearance. Not in distress.  Neck:     Vascular: JVD normal.  Pulmonary:     Breath sounds: Normal breath sounds. No wheezing. No rales.  Cardiovascular:     Normal rate. Regular rhythm.     Murmurs: There is a grade 1/6 systolic murmur at the URSB.  Edema:    Peripheral edema absent.  Abdominal:     Palpations: Abdomen is soft.        Assessment and Plan:    Assessment & Plan Persistent atrial fibrillation (HCC)  Status post left atrial appendage occlusion in March 2025. Currently maintaining sinus rhythm on dofetilide  and followed in the AFib clinic. Off Eliquis  and now on dual antiplatelet therapy with aspirin  and Plavix  for six months.   - Continue dofetilide  500 mcg twice daily, carvedilol  6.25 mg twice daily - Follow up with EP as planned Heart failure with improved ejection fraction (HFimpEF) (HCC) Heart failure with improved ejection fraction. Ejection fraction improved from 30-35% to 50-55% by TEE in March 2025. - Continue Coreg  6.25 mg twice daily. - Continue Entresto  49/51 mg twice daily. - Continue spironolactone  12.5 mg twice daily. Coronary artery disease involving native coronary artery of native heart without angina pectoris Coronary artery  disease with diffuse CAD noted by cardiac catheterization in August 2019. Asymptomatic without chest symptoms to suggest angina.  He is managed medically. - Continue aspirin  81 mg daily. - Continue Lipitor 10 mg daily. Pure hypercholesterolemia Hyperlipidemia with recent LDL in February 2025 at 50, which is optimal.  - Continue Lipitor 10 mg daily. Aortic dilatation (HCC) Follow-up gated CT pending May 31, 2024 Nonrheumatic aortic valve insufficiency Moderate aortic insufficiency noted by TEE in March 2025. Will need repeat echocardiogram in 2026.        Dispo:  Return in about 6 months (around 11/16/2024) for Routine Follow Up, w/ Dr. Paulita Boss.  Signed, Marlyse Single, PA-C

## 2024-05-17 ENCOUNTER — Ambulatory Visit: Payer: Medicare Other | Attending: Physician Assistant | Admitting: Physician Assistant

## 2024-05-17 ENCOUNTER — Encounter: Payer: Self-pay | Admitting: Physician Assistant

## 2024-05-17 VITALS — BP 130/78 | HR 68 | Ht 70.0 in | Wt 227.0 lb

## 2024-05-17 DIAGNOSIS — E7800 Pure hypercholesterolemia, unspecified: Secondary | ICD-10-CM | POA: Insufficient documentation

## 2024-05-17 DIAGNOSIS — I2510 Atherosclerotic heart disease of native coronary artery without angina pectoris: Secondary | ICD-10-CM | POA: Diagnosis not present

## 2024-05-17 DIAGNOSIS — I351 Nonrheumatic aortic (valve) insufficiency: Secondary | ICD-10-CM | POA: Insufficient documentation

## 2024-05-17 DIAGNOSIS — I5032 Chronic diastolic (congestive) heart failure: Secondary | ICD-10-CM | POA: Diagnosis not present

## 2024-05-17 DIAGNOSIS — I77819 Aortic ectasia, unspecified site: Secondary | ICD-10-CM | POA: Insufficient documentation

## 2024-05-17 DIAGNOSIS — I4819 Other persistent atrial fibrillation: Secondary | ICD-10-CM | POA: Insufficient documentation

## 2024-05-17 NOTE — Assessment & Plan Note (Signed)
 Follow-up gated CT pending May 31, 2024

## 2024-05-17 NOTE — Assessment & Plan Note (Signed)
 Coronary artery disease with diffuse CAD noted by cardiac catheterization in August 2019. Asymptomatic without chest symptoms to suggest angina.  He is managed medically. - Continue aspirin  81 mg daily. - Continue Lipitor 10 mg daily.

## 2024-05-17 NOTE — Patient Instructions (Signed)
 Medication Instructions:  No changes *If you need a refill on your cardiac medications before your next appointment, please call your pharmacy*   Follow-Up: At [facility], you and your health needs are our priority.  As part of our continuing mission to provide you with exceptional heart care, our providers are all part of one team.  This team includes your primary Cardiologist (physician) and Advanced Practice Providers or APPs (Physician Assistants and Nurse Practitioners) who all work together to provide you with the care you need, when you need it.  Your next appointment:   6 month(s)  Provider:   Jann Melody, MD    We recommend signing up for the patient portal called MyChart.  Sign up information is provided on this After Visit Summary.  MyChart is used to connect with patients for Virtual Visits (Telemedicine).  Patients are able to view lab/test results, encounter notes, upcoming appointments, etc.  Non-urgent messages can be sent to your provider as well.   To learn more about what you can do with MyChart, go to [email or web address].   Other Instructions

## 2024-05-25 ENCOUNTER — Other Ambulatory Visit (HOSPITAL_COMMUNITY)

## 2024-05-30 DIAGNOSIS — D62 Acute posthemorrhagic anemia: Secondary | ICD-10-CM | POA: Diagnosis not present

## 2024-05-30 DIAGNOSIS — I4891 Unspecified atrial fibrillation: Secondary | ICD-10-CM | POA: Diagnosis not present

## 2024-05-30 DIAGNOSIS — E7800 Pure hypercholesterolemia, unspecified: Secondary | ICD-10-CM | POA: Diagnosis not present

## 2024-05-30 DIAGNOSIS — I10 Essential (primary) hypertension: Secondary | ICD-10-CM | POA: Diagnosis not present

## 2024-05-31 ENCOUNTER — Ambulatory Visit (HOSPITAL_COMMUNITY)
Admission: RE | Admit: 2024-05-31 | Discharge: 2024-05-31 | Disposition: A | Discharge status: Home / Self Care | Source: Ambulatory Visit | Attending: Internal Medicine | Admitting: Internal Medicine

## 2024-05-31 ENCOUNTER — Telehealth: Payer: Self-pay | Admitting: Cardiology

## 2024-05-31 DIAGNOSIS — I77819 Aortic ectasia, unspecified site: Secondary | ICD-10-CM | POA: Insufficient documentation

## 2024-05-31 DIAGNOSIS — I2510 Atherosclerotic heart disease of native coronary artery without angina pectoris: Secondary | ICD-10-CM | POA: Insufficient documentation

## 2024-05-31 DIAGNOSIS — I4891 Unspecified atrial fibrillation: Secondary | ICD-10-CM | POA: Diagnosis not present

## 2024-05-31 DIAGNOSIS — I2693 Single subsegmental pulmonary embolism without acute cor pulmonale: Secondary | ICD-10-CM | POA: Diagnosis not present

## 2024-05-31 DIAGNOSIS — I7121 Aneurysm of the ascending aorta, without rupture: Secondary | ICD-10-CM | POA: Diagnosis not present

## 2024-05-31 DIAGNOSIS — I5022 Chronic systolic (congestive) heart failure: Secondary | ICD-10-CM | POA: Diagnosis not present

## 2024-05-31 MED ORDER — IOHEXOL 350 MG/ML SOLN
75.0000 mL | Freq: Once | INTRAVENOUS | Status: AC | PRN
  Administered 2024-05-31: 75 mL via INTRAVENOUS

## 2024-06-01 ENCOUNTER — Ambulatory Visit: Payer: Self-pay | Admitting: Internal Medicine

## 2024-06-01 DIAGNOSIS — R9389 Abnormal findings on diagnostic imaging of other specified body structures: Secondary | ICD-10-CM

## 2024-06-01 NOTE — Telephone Encounter (Signed)
 Dr. Santo ordered the CT and has addressed with the patient already. Glendia Ferrier, PA-C    06/01/2024 11:51 AM

## 2024-06-02 NOTE — Telephone Encounter (Signed)
 Results routed via EPIC to PCP and Dr. Rosalynn Likens with [facility].

## 2024-06-02 NOTE — Telephone Encounter (Signed)
 Called pt to advise of MD recommendation for VQ scan. Pt reports sent MD a my chart message with concerns. Per pt reviously had Kidney cancer in which pt was advised can come back and attack the lungs.  Pt is wondering if what was seen on imaging is clot or cancer.  Wants to know if any other type of testing is recommended.   Would like MD input as soon as possible.

## 2024-06-02 NOTE — Telephone Encounter (Signed)
 Called pt advised of MD recommendation.  Pt is agreeable order placed.

## 2024-06-09 ENCOUNTER — Encounter (HOSPITAL_COMMUNITY)
Admission: RE | Admit: 2024-06-09 | Discharge: 2024-06-09 | Disposition: A | Discharge status: Home / Self Care | Source: Ambulatory Visit | Attending: Internal Medicine | Admitting: Internal Medicine

## 2024-06-09 ENCOUNTER — Ambulatory Visit (HOSPITAL_COMMUNITY)
Admission: RE | Admit: 2024-06-09 | Discharge: 2024-06-09 | Disposition: A | Discharge status: Home / Self Care | Source: Ambulatory Visit | Attending: Urology | Admitting: Urology

## 2024-06-09 DIAGNOSIS — I700 Atherosclerosis of aorta: Secondary | ICD-10-CM | POA: Insufficient documentation

## 2024-06-09 DIAGNOSIS — I77819 Aortic ectasia, unspecified site: Secondary | ICD-10-CM | POA: Diagnosis present

## 2024-06-09 DIAGNOSIS — R9389 Abnormal findings on diagnostic imaging of other specified body structures: Secondary | ICD-10-CM | POA: Insufficient documentation

## 2024-06-09 DIAGNOSIS — R918 Other nonspecific abnormal finding of lung field: Secondary | ICD-10-CM | POA: Diagnosis not present

## 2024-06-09 DIAGNOSIS — Z01818 Encounter for other preprocedural examination: Secondary | ICD-10-CM | POA: Diagnosis not present

## 2024-06-09 MED ORDER — TECHNETIUM TO 99M ALBUMIN AGGREGATED
4.3000 | Freq: Once | INTRAVENOUS | Status: AC | PRN
  Administered 2024-06-09: 4.3 via INTRAVENOUS

## 2024-06-10 ENCOUNTER — Ambulatory Visit: Payer: Self-pay | Admitting: Internal Medicine

## 2024-06-24 DIAGNOSIS — H903 Sensorineural hearing loss, bilateral: Secondary | ICD-10-CM | POA: Diagnosis not present

## 2024-06-30 DIAGNOSIS — I10 Essential (primary) hypertension: Secondary | ICD-10-CM | POA: Diagnosis not present

## 2024-06-30 DIAGNOSIS — D62 Acute posthemorrhagic anemia: Secondary | ICD-10-CM | POA: Diagnosis not present

## 2024-06-30 DIAGNOSIS — I4891 Unspecified atrial fibrillation: Secondary | ICD-10-CM | POA: Diagnosis not present

## 2024-06-30 DIAGNOSIS — E7800 Pure hypercholesterolemia, unspecified: Secondary | ICD-10-CM | POA: Diagnosis not present

## 2024-07-07 DIAGNOSIS — H5213 Myopia, bilateral: Secondary | ICD-10-CM | POA: Diagnosis not present

## 2024-07-07 DIAGNOSIS — E119 Type 2 diabetes mellitus without complications: Secondary | ICD-10-CM | POA: Diagnosis not present

## 2024-07-07 DIAGNOSIS — H2513 Age-related nuclear cataract, bilateral: Secondary | ICD-10-CM | POA: Diagnosis not present

## 2024-07-19 DIAGNOSIS — I5022 Chronic systolic (congestive) heart failure: Secondary | ICD-10-CM | POA: Diagnosis not present

## 2024-07-19 DIAGNOSIS — D6869 Other thrombophilia: Secondary | ICD-10-CM | POA: Diagnosis not present

## 2024-07-19 DIAGNOSIS — Z0000 Encounter for general adult medical examination without abnormal findings: Secondary | ICD-10-CM | POA: Diagnosis not present

## 2024-07-19 DIAGNOSIS — E1159 Type 2 diabetes mellitus with other circulatory complications: Secondary | ICD-10-CM | POA: Diagnosis not present

## 2024-07-19 DIAGNOSIS — I4891 Unspecified atrial fibrillation: Secondary | ICD-10-CM | POA: Diagnosis not present

## 2024-07-19 DIAGNOSIS — I77819 Aortic ectasia, unspecified site: Secondary | ICD-10-CM | POA: Diagnosis not present

## 2024-07-19 DIAGNOSIS — E7800 Pure hypercholesterolemia, unspecified: Secondary | ICD-10-CM | POA: Diagnosis not present

## 2024-07-19 DIAGNOSIS — I10 Essential (primary) hypertension: Secondary | ICD-10-CM | POA: Diagnosis not present

## 2024-07-28 ENCOUNTER — Other Ambulatory Visit: Payer: Self-pay

## 2024-07-28 MED ORDER — CARVEDILOL 6.25 MG PO TABS: 6.2500 mg | ORAL_TABLET | Freq: Two times a day (BID) | ORAL | 2 refills | Status: AC

## 2024-07-31 DIAGNOSIS — E7800 Pure hypercholesterolemia, unspecified: Secondary | ICD-10-CM | POA: Diagnosis not present

## 2024-07-31 DIAGNOSIS — D62 Acute posthemorrhagic anemia: Secondary | ICD-10-CM | POA: Diagnosis not present

## 2024-07-31 DIAGNOSIS — I10 Essential (primary) hypertension: Secondary | ICD-10-CM | POA: Diagnosis not present

## 2024-07-31 DIAGNOSIS — I4891 Unspecified atrial fibrillation: Secondary | ICD-10-CM | POA: Diagnosis not present

## 2024-08-11 NOTE — Progress Notes (Unsigned)
  Electrophysiology Office Note:   Date:  08/12/2024  ID:  Casey [name]., DOB 09/25/1956, MRN [id number]  Primary Cardiologist: Stanly DELENA Leavens, MD Primary Heart Failure: None Electrophysiologist: Fonda Kitty, MD      History of Present Illness:   Casey [name]. is a 68 y.o. male with h/o AF s/p Watchman 31mm, HFrEF, HTN, obesity s/p gastric sleeve seen today for routine electrophysiology followup.   Since last being seen in our clinic the patient reports doing well overall. He thinks he has lost a few pounds.  Ask what his A1c should be with his medical history.  Is concerned as he recently was taken off his Amaryl . No issues with bleeding on plavix .     He denies chest pain, palpitations, dyspnea, PND, orthopnea, nausea, vomiting, dizziness, syncope, edema, weight gain, or early satiety.   Review of systems complete and found to be negative unless listed in HPI.   EP Information / Studies Reviewed:    EKG is ordered today. Personal review as below.  EKG Interpretation Date/Time:  Friday August 12 2024 10:23:13 EDT Ventricular Rate:  53 PR Interval:  192 QRS Duration:  92 QT Interval:  458 QTC Calculation: 429 R Axis:   -30  Text Interpretation: Sinus bradycardia with marked sinus arrhythmia Left axis deviation Confirmed by Aniceto Jarvis ([id number]) on 08/12/2024 10:35:15 AM    Arrhythmia / AAD / Pertinent EP Studies AF LAAO / Watchman 02/11/24     Risk Assessment/Calculations:    CHA2DS2-VASc Score = 5   This indicates a 7.2% annual risk of stroke. The patient's score is based upon: CHF History: 1 HTN History: 1 Diabetes History: 1 Stroke History: 0 Vascular Disease History: 1 Age Score: 1 Gender Score: 0             Physical Exam:   VS:  BP 118/72   Pulse (!) 53   Ht 5' 10 (1.778 m)   Wt 223 lb 3.2 oz (101.2 kg)   SpO2 96%   BMI 32.03 kg/m    Wt Readings from Last 3 Encounters:  08/12/24 223 lb 3.2 oz (101.2 kg)  05/17/24 227 lb  (103 kg)  03/30/24 229 lb (103.9 kg)     GEN: Well nourished, well developed in no acute distress NECK: No JVD; No carotid bruits CARDIAC: Regular rate and rhythm, no murmurs, rubs, gallops RESPIRATORY:  Clear to auscultation without rales, wheezing or rhonchi  ABDOMEN: Soft, non-tender, non-distended EXTREMITIES:  No edema; No deformity   ASSESSMENT AND PLAN:    Persistent Atrial Fibrillation  S/p Watchman Device 02/11/24 High Risk Drug Monitoring: Tikosyn    Poor candidacy for anticoagulation due to prior GIB, 31 mm Watchman FLX Pro  -post LAAO CT scan shows device is well seated and no peri-device leak / no thrombus  -completed 6 months medical therapy  -STOP plavix   -transition to ASA 81mg  daily given cardiac hx  -EKG with NSR, QTc  -Tikosyn  500 mcg BID  -update Tikosyn  labs > BMP, Mg+   -remove Keflex  from [facility] / no longer needs dental prophylaxis   Hypertension  -well controlled on current regimen   HFrecEF  LVEF 50-55% 02/11/24  -euvolemic on exam  -GDMT per Cardiology   Follow up with AF Clinic in 4 months for Tikosyn . EP APP in 6 months  Signed, Jarvis Aniceto, NP-C, AGACNP-BC [facility] - Electrophysiology  08/12/2024, 6:02 PM

## 2024-08-12 ENCOUNTER — Ambulatory Visit: Attending: Pulmonary Disease | Admitting: Pulmonary Disease

## 2024-08-12 ENCOUNTER — Encounter: Payer: Self-pay | Admitting: Pulmonary Disease

## 2024-08-12 VITALS — BP 118/72 | HR 53 | Ht 70.0 in | Wt 223.2 lb

## 2024-08-12 DIAGNOSIS — I4819 Other persistent atrial fibrillation: Secondary | ICD-10-CM | POA: Insufficient documentation

## 2024-08-12 DIAGNOSIS — Z95818 Presence of other cardiac implants and grafts: Secondary | ICD-10-CM | POA: Diagnosis not present

## 2024-08-12 DIAGNOSIS — Z5181 Encounter for therapeutic drug level monitoring: Secondary | ICD-10-CM | POA: Insufficient documentation

## 2024-08-12 DIAGNOSIS — I5032 Chronic diastolic (congestive) heart failure: Secondary | ICD-10-CM | POA: Insufficient documentation

## 2024-08-12 NOTE — Patient Instructions (Signed)
 Medication Instructions:  Your physician has recommended you make the following change in your medication:  STOP PLAVIX    *If you need a refill on your cardiac medications before your next appointment, please call your pharmacy*  Lab Work: TODAY: BMET, MG+2 If you have labs (blood work) drawn today and your tests are completely normal, you will receive your results only by: MyChart Message (if you have MyChart) OR A paper copy in the mail If you have any lab test that is abnormal or we need to change your treatment, we will call you to review the results.  Testing/Procedures: NONE  Follow-Up: At [facility], you and your health needs are our priority.  As part of our continuing mission to provide you with exceptional heart care, our providers are all part of one team.  This team includes your primary Cardiologist (physician) and Advanced Practice Providers or APPs (Physician Assistants and Nurse Practitioners) who all work together to provide you with the care you need, when you need it.  Your next appointment:   4 month(s)  Provider:   You will follow up in the Atrial Fibrillation Clinic located at [facility]. Your provider will be: Clint R. Fenton, PA-C   We recommend signing up for the patient portal called MyChart.  Sign up information is provided on this After Visit Summary.  MyChart is used to connect with patients for Virtual Visits (Telemedicine).  Patients are able to view lab/test results, encounter notes, upcoming appointments, etc.  Non-urgent messages can be sent to your provider as well.   To learn more about what you can do with MyChart, go to [email or web address].

## 2024-08-13 ENCOUNTER — Ambulatory Visit: Payer: Self-pay | Admitting: Pulmonary Disease

## 2024-08-13 LAB — BASIC METABOLIC PANEL WITH GFR
BUN/Creatinine Ratio: 24 (ref 10–24)
BUN: 32 mg/dL — ABNORMAL HIGH (ref 8–27)
CO2: 21 mmol/L (ref 20–29)
Calcium: 9.5 mg/dL (ref 8.6–10.2)
Chloride: 104 mmol/L (ref 96–106)
Creatinine, Ser: 1.33 mg/dL — ABNORMAL HIGH (ref 0.76–1.27)
Glucose: 147 mg/dL — ABNORMAL HIGH (ref 70–99)
Potassium: 4.7 mmol/L (ref 3.5–5.2)
Sodium: 139 mmol/L (ref 134–144)
eGFR: 59 mL/min/1.73 — ABNORMAL LOW (ref >59)

## 2024-08-13 LAB — MAGNESIUM: Magnesium: 2.1 mg/dL (ref 1.6–2.3)

## 2024-08-16 ENCOUNTER — Other Ambulatory Visit: Payer: Self-pay | Admitting: Internal Medicine

## 2024-08-16 ENCOUNTER — Other Ambulatory Visit (HOSPITAL_COMMUNITY): Payer: Self-pay | Admitting: Internal Medicine

## 2024-08-17 NOTE — Telephone Encounter (Signed)
 Pt of Dr. Catherene. Former Pt of Dr. Claudene. Last OV with Dr. Santo was in 12/2023. This Spironolactone  was last refilled by A-fib clinic. Pt's last OV with EP was in 08/2024 with Daphne Barrack NP. I didn't see anything mentioned in her note regarding this RX. Please advise.

## 2024-09-06 ENCOUNTER — Telehealth: Payer: Self-pay | Admitting: Pharmacy Technician

## 2024-09-06 NOTE — Telephone Encounter (Signed)
 Entresto  refill faxed to [employer] confirmation:  Fax Transmission Report-------  To:               Recipient at [phone] Subject:          Fw: Prescription Result:           The transmission was successful. Explanation:      All Pages Ok Pages Sent:       2 Connect Time:     1 minutes, 16 seconds Transmit Time:    09/06/2024 16:15

## 2024-09-06 NOTE — Telephone Encounter (Signed)
 Received notification from [employer] that a refill request is required: placed in media:

## 2024-09-29 ENCOUNTER — Other Ambulatory Visit (HOSPITAL_COMMUNITY): Payer: Self-pay | Admitting: Urology

## 2024-09-29 ENCOUNTER — Ambulatory Visit (HOSPITAL_COMMUNITY)
Admission: RE | Admit: 2024-09-29 | Discharge: 2024-09-29 | Disposition: A | Discharge status: Home / Self Care | Source: Ambulatory Visit | Attending: Urology | Admitting: Urology

## 2024-09-29 DIAGNOSIS — C649 Malignant neoplasm of unspecified kidney, except renal pelvis: Secondary | ICD-10-CM | POA: Diagnosis not present

## 2024-09-29 DIAGNOSIS — I517 Cardiomegaly: Secondary | ICD-10-CM | POA: Diagnosis not present

## 2024-09-29 DIAGNOSIS — C641 Malignant neoplasm of right kidney, except renal pelvis: Secondary | ICD-10-CM | POA: Diagnosis not present

## 2024-10-18 DIAGNOSIS — C641 Malignant neoplasm of right kidney, except renal pelvis: Secondary | ICD-10-CM | POA: Diagnosis not present

## 2024-11-07 DIAGNOSIS — R3915 Urgency of urination: Secondary | ICD-10-CM | POA: Diagnosis not present

## 2024-11-07 DIAGNOSIS — C641 Malignant neoplasm of right kidney, except renal pelvis: Secondary | ICD-10-CM | POA: Diagnosis not present

## 2024-11-17 NOTE — Progress Notes (Signed)
 [facility] [place] Hearing Aid Note   Patient Name: Casey [name]   Patient DOB: 07/22/1956                Patient Age: 68 y.o.     Reason for Visit:  Casey [name] wears two O[employer]on Intent 3 miniRITE R hearing aids.  Today, Casey [name] is here to pick up repaired left aid.  Procedure:   Hearing aids were programmed using his last session.  The aids were paired with his phone.    Billing: $50.00 cosmetic

## 2024-12-07 ENCOUNTER — Ambulatory Visit (HOSPITAL_COMMUNITY)
Admission: RE | Admit: 2024-12-07 | Discharge: 2024-12-07 | Disposition: A | Discharge status: Home / Self Care | Source: Ambulatory Visit | Attending: Physician Assistant | Admitting: Physician Assistant

## 2024-12-07 VITALS — BP 152/76 | HR 60 | Ht 70.0 in | Wt 229.4 lb

## 2024-12-07 DIAGNOSIS — I4891 Unspecified atrial fibrillation: Secondary | ICD-10-CM | POA: Diagnosis present

## 2024-12-07 DIAGNOSIS — D6869 Other thrombophilia: Secondary | ICD-10-CM | POA: Diagnosis not present

## 2024-12-07 DIAGNOSIS — I4819 Other persistent atrial fibrillation: Secondary | ICD-10-CM | POA: Diagnosis present

## 2024-12-07 DIAGNOSIS — Z5181 Encounter for therapeutic drug level monitoring: Secondary | ICD-10-CM | POA: Diagnosis present

## 2024-12-07 DIAGNOSIS — Z79899 Other long term (current) drug therapy: Secondary | ICD-10-CM | POA: Diagnosis not present

## 2024-12-07 NOTE — Progress Notes (Signed)
 "  Primary Care Physician: Nanci Senior, MD Referring Physician: Dr. Wilbert Bihari   Casey [name]. is a 69 y.o. male with a h/o obesity, s/p gastric sleeve, HTN, DM that presented for pre op for rt parotidectomy 8/8 and was found to be in rate controlled afib, from which he was asymptomatic. He was sent to the afib clinic for evaluation. 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 He has felt some fatigue, less stamina for several months. Was just seen by PCP yesterday and nothing out of the ordinary was noted with his heart rhythm. He denies any exertional chest pain.   He does not drink alcohol, no tobacco use,no excessive caffeine. He has kept his weight stable for several years. Was almost 100 lbs heavier before gastric sleeve.  I discussed with Dr. Kelsie and he recommended an echo and if ok, he would be considered at low risk for surgery and could start anticoagulation after surgery. However, pt is now back in the office as his echo did show a reduced EF at 30-35%. Dr. Kelsie discussed with Dr. Carlie and it is felt most appropriate  to delay surgery in order to obtain  LHC to further determine his risk for surgery. He does have cardiac risk factors for CAD , ie , obesity,  HTN, DM. He denies any exertional chest pain but c/o fatigue/ low stamina  for several months/early summer. LHC did show 3 vessel  moderate  CAD  F/u in afib clinic, he is now been on anticoagulation x 2 weeks without interruption and can now schedule cardioversion after another week, he is in agreement.  F/u in afib clinic,9/20, he unfortunately had unsuccessful cardioversion and is back in the clinic to discuss antiarrythmic's.It was decided that he would come into the hospital for Tikosyn  after he checked on the price of the drug.  F/u in afib clinic, 10/1, for admission for Tikosyn .  He will get the drug free for the rest  of the year as he had met his out of pocket deductible.   F/u in afib clinic 10/11. He is staying in SR on tikosyn . He feels improved. His reduced EF meds have been titrated by Dr. Claudene.  F/u in afib clinic,01/07/19. He is in SR and has been doing well staying in rhythm on dofetilide . He ultimately had his parotid gland successfully removed and did not have any issues with the surgery, he was benign. He had a sleep study and it was positive for OSA. He is suppose to pick up his equipment soon.He has had an echo since restoring SR and has shown normalization of EF.  F/u in afib clinic 10/22, he is staying in SR with tikosyn . He has had neck surgeries and seeing some improvement in his pain.   F/u in afib clinic, 02/01/20. He continues to stay in SR with Tikosyn . Continues on eliquis  with a CHA2DS2VASc of 2. Had a back injection last week and came off eliquis  x 3 days. He is anticipating  a surgery for a rod to be placed in his cervical spine in the near future. Has had both covid shots.  Here in afib clinic 08/02/20,  for dofetilide  screening. He reports that he  is doing well without any afib burden. EKG shows SR with stable qt. He also reports have renal CA and had Rt partial nephrectomy in May. It was felt the  surgery was successful and he did not have to have any f/u radiation or  Chemo. He remained in SR during the surgery. He has lost around 39 lbs form the surgery, mostly as he did not have much appetite for several weeks following surgery.   F/u in afib clinic, 01/30/21. He has not noted any afib. Remains on tikosyn . He feels well. No changes in health. 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 No issues  with eliquis  for a CHA2DS2VASc score of 2.   F/u afib clinic, 08/07/21. He has not noted any afib. Compliant with Tikosyn  and anticoagulation. Passed a kidney stone a while back.   F/u in afib clinic, 02/05/22. He is doing well on  tikosyn . No afib noted. Qtc stable. States will need a hernia repair in May.   F/u in afib clinic for Tikosyn  surveillance, 10/07/22. He is staying in SR. 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 Qt is stable. Being compliant with tikosyn  and eliquis .   F/u in afib clinic for Tikosyn  surveillance, 04/06/23. He is in SR today. He is doing well overall and has not had any Afib since November. His main concern today is elevated systolic BP, essentially since November when his coreg  was lowered from 12.5 BID to 6.25 mg BID. No missed doses of Tikosyn  or Eliquis .  F/u in Afib clinic for Tikosyn  surveillance, 10/12/23. He is in NSR today. No episodes of Afib since last office visit. His daughter has an Apple watch and spot checks him from time to time regarding his rhythm. No missed doses of Tikosyn . He is on coumadin  managed by the coumadin  clinic. He notes his levels are always off and interested in coming off the coumadin . He was on Eliquis  but it was too expensive.   Follow up 12/07/24. Patient returns for follow up for atrial fibrillation and dofetilide  monitoring. He remains in SR today and feels well. He denies any interim symptoms of afib. He is s/p Watchman implant 01/2024 and is no longer on anticoagulation.   Today, he  denies symptoms of palpitations, chest pain, shortness of breath, orthopnea, PND, lower extremity edema, dizziness, presyncope, syncope, snoring, daytime somnolence, bleeding, or neurologic sequela. The patient is tolerating medications without difficulties and is otherwise without complaint today.   Past Medical History:  Diagnosis Date   Arthritis    Atrial fibrillation (HCC) 07/2018   Cancer (HCC)    skin Ca on nose,kidney   Chronic kidney disease    Complication of anesthesia    diff. waking up   Diabetes mellitus    Dysrhythmia 07/2018   A-Fib   Yzjijryz(215.9)    Hypertension    dr Hugh   pcp   Mixed  hyperlipidemia 05/11/2023   Presence of Watchman left atrial appendage closure device    31 mm device, 25-39% compression, Dr. Kennyth on 02/11/24   Sleep apnea    prior to weight lose surgery-Dr. Carlie dole not use CPAP    Current Outpatient Medications  Medication Sig Dispense Refill   amLODipine  (NORVASC ) 2.5 MG tablet TAKE ONE TABLET BY MOUTH ONE TIME DAILY 90 tablet 3   aspirin  EC 81 MG tablet Take 1 tablet (81 mg total) by mouth daily. Swallow whole.     atorvastatin  (LIPITOR) 10 MG tablet Take 10 mg by mouth at bedtime.   1   carvedilol  (COREG )  6.25 MG tablet Take 1 tablet (6.25 mg total) by mouth 2 (two) times daily with a meal. 180 tablet 2   Docusate Sodium  (DSS) 100 MG CAPS Take 2 capsules by mouth daily as needed (constipation). (Patient taking differently: Take 2 capsules by mouth as needed (constipation).)     dofetilide  (TIKOSYN ) 500 MCG capsule Take 1 capsule (500 mcg total) by mouth 2 (two) times daily. 180 capsule 3   ENTRESTO  49-51 MG TAKE 1 TABLET BY MOUTH TWICE A DAY 180 tablet 3   ezetimibe  (ZETIA ) 10 MG tablet Take 10 mg by mouth daily.     glimepiride  (AMARYL ) 2 MG tablet 1 tablet with breakfast or the first main meal of the day Orally Once a day; Duration: 90 days     glucose blood test strip USE TO TEST BLOOD SUGAR THREE TIMES A DAY     HYDROcodone -acetaminophen  (NORCO) 10-325 MG tablet Take 1 tablet by mouth every 6 (six) hours as needed for severe pain (pain score 7-10). (Patient taking differently: Take 1 tablet by mouth as needed for severe pain (pain score 7-10).)     magnesium  gluconate (MAGONATE) 500 MG tablet Take 500 mg by mouth 2 (two) times daily.     metFORMIN  (GLUCOPHAGE -XR) 500 MG 24 hr tablet Take 500 mg by mouth 2 (two) times daily with a meal.  1   Multiple Vitamin (ONE DAILY MULTIVITAMIN ADULT PO) Take 1 tablet by mouth daily.     Multiple Vitamins-Minerals (PRESERVISION AREDS PO) Take 1 capsule by mouth daily.     pantoprazole (PROTONIX) 40 MG  tablet Take 40 mg by mouth every morning.     spironolactone  (ALDACTONE ) 25 MG tablet TAKE ONE TABLET BY MOUTH ONE TIME DAILY (Patient taking differently: Take 12.5 mg by mouth 2 (two) times daily.) 90 tablet 2   topiramate  (TOPAMAX ) 25 MG tablet Take 25 mg by mouth 2 (two) times daily.      No current facility-administered medications for this encounter.    ROS- All systems are reviewed and negative except as per the HPI above  Physical Exam: Vitals:   12/07/24 1002  Weight: 104.1 kg  Height: 5' 10 (1.778 m)    Wt Readings from Last 3 Encounters:  12/07/24 104.1 kg  08/12/24 101.2 kg  05/17/24 103 kg    GEN: Well nourished, well developed in no acute distress CARDIAC: Regular rate and rhythm with occasional ectopy, no murmurs, rubs, gallops RESPIRATORY:  Clear to auscultation without rales, wheezing or rhonchi  ABDOMEN: Soft, non-tender, non-distended EXTREMITIES:  No edema; No deformity    EKG Interpretation Date/Time:  Wednesday December 07 2024 10:15:09 EST Ventricular Rate:  60 PR Interval:  192 QRS Duration:  96 QT Interval:  452 QTC Calculation: 452 R Axis:   -7  Text Interpretation: Sinus rhythm with occasional Premature ventricular complexes Otherwise normal ECG When compared with ECG of 12-Aug-2024 10:23, Premature ventricular complexes are now Present Confirmed by Mckenley Birenbaum ([id number]) on 12/07/2024 10:27:36 AM      CHA2DS2-VASc Score = 5  The patient's score is based upon: CHF History: 1 HTN History: 1 Diabetes History: 1 Stroke History: 0 Vascular Disease History: 1 Age Score: 1 Gender Score: 0       ASSESSMENT AND PLAN: Persistent Atrial Fibrillation (ICD10:  I48.19) The patient's CHA2DS2-VASc score is 5, indicating a 7.2% annual risk of stroke.   Patient appears to be maintaining SR. Continue dofetilide  500 mcg BID Continue carvedilol  6.25 mg BID  Secondary Hypercoagulable State (  ICD10:  D68.69) The patient is at significant risk for  stroke/thromboembolism based upon his CHA2DS2-VASc Score of 5.  S/p Watchman implant 02/11/24.  High Risk Medication Monitoring (ICD 10: J342684) Patient requires ongoing monitoring for anti-arrhythmic medication which has the potential to cause life threatening arrhythmias. QT interval on ECG acceptable for dofetilide  monitoring. Check bmet/mag today.     CAD No anginal symptoms Followed by Dr Santo  HFrecEF EF 50-55% GDMT per primary cardiology team Fluid status appears stable today  HTN Mildly elevated today, better controlled at previous visits.  Continue to monitor for now.    Follow up in the AF clinic in 6 months.    Daril Kicks PA-C Afib Clinic [facility] [address] [place], [place] [address] [phone]  "

## 2024-12-28 ENCOUNTER — Telehealth: Payer: Self-pay

## 2024-12-28 NOTE — Telephone Encounter (Signed)
 Left voicemail for patient to return call to see how he is doing 1 year post LAAO implant 02/11/24.

## 2024-12-29 NOTE — Telephone Encounter (Signed)
 Called to check in with patient, who had LAAO on 02/10/2024. The patient reports doing well with no issues.  The patient understands to call with questions or concerns.   Patient had questions regarding Entresto  assistance. He said the medication is going generic and he wasn't sure what he needed to. Advised would send to pharmacy assistance team with his inquiry.

## 2024-12-30 ENCOUNTER — Other Ambulatory Visit (HOSPITAL_COMMUNITY): Payer: Self-pay

## 2024-12-30 ENCOUNTER — Telehealth: Payer: Self-pay | Admitting: Pharmacy Technician

## 2024-12-30 ENCOUNTER — Other Ambulatory Visit: Payer: Self-pay | Admitting: Pulmonary Disease

## 2025-06-14 ENCOUNTER — Ambulatory Visit (HOSPITAL_COMMUNITY): Admitting: Internal Medicine
